# Patient Record
Sex: Male | Born: 1970 | Race: White | Hispanic: No | Marital: Married | Smoking: Never smoker
Health system: Southern US, Community
[De-identification: ages and names within clinical notes are randomized; demographics above are authoritative.]

## PROBLEM LIST (undated history)

## (undated) DIAGNOSIS — E785 Hyperlipidemia, unspecified: Secondary | ICD-10-CM

## (undated) DIAGNOSIS — I10 Essential (primary) hypertension: Secondary | ICD-10-CM

## (undated) HISTORY — DX: Essential (primary) hypertension: I10

## (undated) HISTORY — DX: Hyperlipidemia, unspecified: E78.5

## (undated) HISTORY — PX: VASECTOMY: SHX75

---

## 1990-11-22 HISTORY — PX: VASECTOMY: SHX75

## 2009-01-21 ENCOUNTER — Encounter: Payer: Self-pay | Admitting: Cardiology

## 2009-01-21 ENCOUNTER — Ambulatory Visit: Payer: Self-pay | Admitting: Cardiology

## 2009-01-21 DIAGNOSIS — I10 Essential (primary) hypertension: Secondary | ICD-10-CM | POA: Insufficient documentation

## 2009-01-21 DIAGNOSIS — F528 Other sexual dysfunction not due to a substance or known physiological condition: Secondary | ICD-10-CM

## 2009-01-21 DIAGNOSIS — N529 Male erectile dysfunction, unspecified: Secondary | ICD-10-CM | POA: Insufficient documentation

## 2009-01-21 DIAGNOSIS — E785 Hyperlipidemia, unspecified: Secondary | ICD-10-CM

## 2009-04-30 ENCOUNTER — Encounter (INDEPENDENT_AMBULATORY_CARE_PROVIDER_SITE_OTHER): Payer: Self-pay | Admitting: *Deleted

## 2009-06-10 ENCOUNTER — Ambulatory Visit: Payer: Self-pay | Admitting: Cardiology

## 2009-09-18 ENCOUNTER — Encounter (INDEPENDENT_AMBULATORY_CARE_PROVIDER_SITE_OTHER): Payer: Self-pay | Admitting: *Deleted

## 2010-05-18 ENCOUNTER — Telehealth: Payer: Self-pay | Admitting: Cardiology

## 2010-07-15 ENCOUNTER — Ambulatory Visit: Payer: Self-pay | Admitting: Cardiology

## 2010-07-15 DIAGNOSIS — F411 Generalized anxiety disorder: Secondary | ICD-10-CM

## 2010-07-15 DIAGNOSIS — F419 Anxiety disorder, unspecified: Secondary | ICD-10-CM

## 2010-07-15 HISTORY — DX: Anxiety disorder, unspecified: F41.9

## 2010-12-22 NOTE — Progress Notes (Signed)
Summary: refill  Medications Added * MULTI VITAMIN ONE by mouth DAILY * FISH OIL ONE by mouth DAILY * VITAMIN B-6 ONE by mouth DAILY VITAMIN B-12 100 MCG TABS (CYANOCOBALAMIN) once daily LISINOPRIL 5 MG TABS (LISINOPRIL) once daily LISINOPRIL 10 MG TABS (LISINOPRIL) 1 by mouth daily       Phone Note Refill Request Message from:  Patient on May 18, 2010 9:09 AM  Refills Requested: Medication #1:  LISINOPRIL 5 MG TABS once daily. Send to [facility]  [phone]  Initial call taken by: Judie Grieve,  May 18, 2010 9:10 AM  Follow-up for Phone Call        Home number not working.Called [facility] to change medication to 10mg  1 by mouth daily. Marrion Coy, CNA  May 18, 2010 11:15 AM  Follow-up by: Marrion Coy, CNA,  May 18, 2010 11:15 AM    New/Updated Medications: LISINOPRIL 10 MG TABS (LISINOPRIL) 1 by mouth daily Prescriptions: LISINOPRIL 10 MG TABS (LISINOPRIL) 1 by mouth daily  #30 x 2   Entered by:   Marrion Coy, CNA   Authorized by:   Rollene Rotunda, MD, [facility]   Signed by:   Marrion Coy, CNA on 05/18/2010   Method used:   Electronically to        [facility]  [address] (retail)       [address]       [place], [place]  [address]       Ph: [phone]       Fax: [phone]   RxID:   [id number] LISINOPRIL 5 MG TABS (LISINOPRIL) once daily  #30 x 1   Entered by:   Marrion Coy, CNA   Authorized by:   Rollene Rotunda, MD, [facility]   Signed by:   Marrion Coy, CNA on 05/18/2010   Method used:   Electronically to        [facility]  [address] (retail)       [address]       [place], [place]  [address]       Ph: [phone]       Fax: [phone]   RxID:   [id number]

## 2010-12-22 NOTE — Assessment & Plan Note (Signed)
Summary: ROV  Medications Added LISINOPRIL 10 MG TABS (LISINOPRIL) 1/2 by mouth daily      Allergies Added: NKDA\   Visit Type:  Follow-up Primary Provider:  Dr. Elana Alm  CC:  HTN.  History of Present Illness: The patient presents for followup of he is hypertension. At the last visit I had asked him to increase his lisinopril. However, he misunderstood and has only been taking continued one half tablet. He says however that with this his blood pressure is controlled in the 130s over 70s at home. He does not have any new cardiovascular complaints although he has had a lot of stress recently. He says that he has been quick to be agitated and has a very short temper. He's actually had to take Xanax from his wife recently. He's not sure what his blood pressure does during these episodes. He also describes some increased sweating and feeling very hot natured. He is not having any palpitations, presyncope or syncope. He's not having any new shortness of breath, PND orthopnea. He denies any chest pain.  Current Medications (verified): 1)  Multi Vitamin .... One By Mouth Daily 2)  Fish Oil .... One By Mouth Daily 3)  Vitamin B-6 .... One By Mouth Daily 4)  Vitamin B-12 100 Mcg Tabs (Cyanocobalamin) .... Once Daily 5)  Lisinopril 10 Mg Tabs (Lisinopril) .... 1/2 By Mouth Daily  Allergies (verified): No Known Drug Allergies  Past History:  Past Medical History: Reviewed history from 06/10/2009 and no changes required. Dyslipidemia Hypertension  Past Surgical History: Reviewed history from 06/09/2009 and no changes required. Vasectomy  Review of Systems       As stated in the HPI and negative for all other systems.   Vital Signs:  Patient profile:   40 year old male Height:      69 inches Weight:      202 pounds BMI:     29.94 Pulse rate:   79 / minute Resp:     16 per minute BP sitting:   142 /   Vitals Entered By: Marrion Coy, CNA (July 15, 2010 4:14 PM)  Physical  Exam  General:  Well developed, well nourished, in no acute distress. Head:  normocephalic and atraumatic Neck:  Neck supple, no JVD. No masses, thyromegaly or abnormal cervical nodes. Chest Wall:  no deformities or breast masses noted Lungs:  Clear bilaterally to auscultation and percussion. Abdomen:  Bowel sounds positive; abdomen soft and non-tender without masses, organomegaly, or hernias noted. No hepatosplenomegaly. Msk:  Back normal, normal gait. Muscle strength and tone normal. Extremities:  No clubbing or cyanosis. Neurologic:  Alert and oriented x 3. Skin:  Intact without lesions or rashes. Cervical Nodes:  no significant adenopathy Psych:  Normal affect.   Detailed Cardiovascular Exam  Neck    Carotids: Carotids full and equal bilaterally without bruits.      Neck Veins: Normal, no JVD.    Heart    Inspection: no deformities or lifts noted.      Palpation: normal PMI with no thrills palpable.      Auscultation: regular rate and rhythm, S1, S2 without murmurs, rubs, gallops, or clicks.    Vascular    Abdominal Aorta: no palpable masses, pulsations, or audible bruits.      Femoral Pulses: normal femoral pulses bilaterally.      Pedal Pulses: normal pedal pulses bilaterally.      Radial Pulses: normal radial pulses bilaterally.      Peripheral Circulation: no clubbing,  cyanosis, or edema noted with normal capillary refill.     EKG  Procedure date:  07/15/2010  Findings:      Sinus rhythm, rate 79, axis within normal limits, intervals within normal limits, no acute ST-T wave changes  Impression & Recommendations:  Problem # 1:  ESSENTIAL HYPERTENSION, BENIGN (ICD-401.1) Since he seems to be doing well I will keep him on the current regimen. However, he never had the VMA and metanephrine analysis that he was to have and I will reorder this. Orders: EKG w/ Interpretation (93000)  Problem # 2:  TOBACCO ABUSE (ICD-305.1) He is completely abstaining from tobacco  products and I applaud this.  Problem # 3:  ANXIETY STATE, UNSPECIFIED (ICD-300.00) He has a lot of anxiety type II that of a coworker. Sounds like he has some anger management issues. I have asked him to discuss this with his primary provider as he may need prescriptions for an aunt to anxiety or antidepressant.  Patient Instructions: 1)  Your physician recommends that you schedule a follow-up appointment in: 1 year with Dr Antoine Poche 2)  Your physician recommends that you return for lab work as scheduled  24 hour urine for VMA and mets 3)  Your physician recommends that you continue on your current medications as directed. Please refer to the Current Medication list given to you today.

## 2011-06-09 ENCOUNTER — Telehealth: Payer: Self-pay | Admitting: Cardiology

## 2011-06-09 DIAGNOSIS — I10 Essential (primary) hypertension: Secondary | ICD-10-CM

## 2011-06-09 NOTE — Telephone Encounter (Signed)
Pt has been treated here most recently in 8/11 for HTN, Tobacco abuse and anxiety.  He was in normal sinus rhythm with a HR of 79.  He denied any SOB at that time.  Pt was to have had a 24 hour urine for VMA and catecholimines however I do not see where this was done.  Pt should see primary care MD 1 st.

## 2011-06-09 NOTE — Telephone Encounter (Signed)
Pt has been having sob, made appt with pcp and wife thinks he should be seen here, appt with hochrein 9-6

## 2011-06-10 ENCOUNTER — Other Ambulatory Visit: Payer: Self-pay | Admitting: *Deleted

## 2011-06-10 DIAGNOSIS — I10 Essential (primary) hypertension: Secondary | ICD-10-CM

## 2011-06-10 MED ORDER — LISINOPRIL 10 MG PO TABS: 5.0000 mg | ORAL_TABLET | Freq: Every day | ORAL | Status: DC

## 2011-06-10 NOTE — Telephone Encounter (Signed)
Per pt call, pt returning call. Please call back.

## 2011-06-10 NOTE — Telephone Encounter (Signed)
In speaking with pt he states that he has been waking in the night with a rapid heart rate.  He is not able to take his pulse therefore he doesn't know how fast it is going.  The rapid hr causes him to sweat but he has no chest pain or shortness of breath.  He reports that he has not been taking his medications and would like a rx sent into his pharmacy so that he can restart it.  His BP has been elevated at 135/90 and higher.  Pt was offered an appointment with Lilian Coma however he would like to wait to see Dr Antoine Poche.

## 2011-07-02 ENCOUNTER — Encounter: Payer: Self-pay | Admitting: Cardiology

## 2011-07-29 ENCOUNTER — Ambulatory Visit (INDEPENDENT_AMBULATORY_CARE_PROVIDER_SITE_OTHER): Payer: 59 | Admitting: Cardiology

## 2011-07-29 ENCOUNTER — Encounter: Payer: Self-pay | Admitting: Cardiology

## 2011-07-29 DIAGNOSIS — I10 Essential (primary) hypertension: Secondary | ICD-10-CM

## 2011-07-29 DIAGNOSIS — F411 Generalized anxiety disorder: Secondary | ICD-10-CM

## 2011-07-29 DIAGNOSIS — F528 Other sexual dysfunction not due to a substance or known physiological condition: Secondary | ICD-10-CM

## 2011-07-29 DIAGNOSIS — F17200 Nicotine dependence, unspecified, uncomplicated: Secondary | ICD-10-CM

## 2011-07-29 NOTE — Patient Instructions (Signed)
Follow up in 1 year with Dr Hochrein.  You will receive a letter in the mail 2 months before you are due.  Please call us when you receive this letter to schedule your follow up appointment.  The current medical regimen is effective;  continue present plan and medications.  

## 2011-07-29 NOTE — Progress Notes (Signed)
HPI The patient presents for follow up of HTN.  Since I last saw him he, on his own, increased lisinopril to 10 mg daily.  He was taking 5 mg.  His BP has been 130 - 140/80 - 99.  The patient denies any new symptoms such as chest discomfort, neck or arm discomfort. There has been no new shortness of breath, PND or orthopnea. There have been no reported palpitations, presyncope or syncope.  He does have occasional ED.  His heart races when he is watching the races and gets excited.  No Known Allergies  Current Outpatient Prescriptions  Medication Sig Dispense Refill  . Fish Oil OIL Take 1 tablet by mouth daily.        Marland Kitchen lisinopril (PRINIVIL,ZESTRIL) 10 MG tablet Take 10 mg by mouth daily.        . Potassium Chloride (KLOR-CON PO) Take by mouth as directed. 2 po dialy       . Pyridoxine HCl (VITAMIN B-6 PO) Take 1 tablet by mouth daily.        . vitamin B-12 (CYANOCOBALAMIN) 100 MCG tablet Take 100 mcg by mouth daily.          Past Medical History  Diagnosis Date  . Dyslipidemia   . HTN (hypertension)     Past Surgical History  Procedure Date  . Vasectomy     ROS: As stated in the HPI and negative for all other systems.  PHYSICAL EXAM BP 140/99  Pulse 79  Resp 16  Ht 5\' 9"  (1.753 m)  Wt 201 lb (91.173 kg)  BMI 29.68 kg/m2 GENERAL:  Well appearing HEENT:  Pupils equal round and reactive, fundi not visualized, oral mucosa unremarkable NECK:  No jugular venous distention, waveform within normal limits, carotid upstroke brisk and symmetric, no bruits, no thyromegaly LYMPHATICS:  No cervical, inguinal adenopathy LUNGS:  Clear to auscultation bilaterally BACK:  No CVA tenderness CHEST:  Unremarkable HEART:  PMI not displaced or sustained,S1 and S2 within normal limits, no S3, no S4, no clicks, no rubs, no murmurs ABD:  Flat, positive bowel sounds normal in frequency in pitch, no bruits, no rebound, no guarding, no midline pulsatile mass, no hepatomegaly, no splenomegaly EXT:  2  plus pulses throughout, no edema, no cyanosis no clubbing SKIN:  No rashes no nodules NEURO:  Cranial nerves II through XII grossly intact, motor grossly intact throughout PSYCH:  Cognitively intact, oriented to person place and time  EKG:  Sinus rhythm, rate 70, axis within normal limits, intervals within normal limits, no acute ST-T wave changes.   ASSESSMENT AND PLAN

## 2011-07-29 NOTE — Assessment & Plan Note (Signed)
He will keep taking the 10 mg lisinopril every day.  He will lose 5 lbs and continue to reduce salt.  If he is running greater than 140/90 he will let me know.

## 2011-07-29 NOTE — Assessment & Plan Note (Signed)
He is avoiding all tobacco.

## 2011-07-29 NOTE — Assessment & Plan Note (Signed)
I do think that this plays a role and we have talked about this.

## 2011-07-29 NOTE — Assessment & Plan Note (Signed)
He will take Viagra PRN.

## 2013-10-22 ENCOUNTER — Ambulatory Visit (INDEPENDENT_AMBULATORY_CARE_PROVIDER_SITE_OTHER): Payer: BC Managed Care – PPO | Admitting: *Deleted

## 2013-10-22 DIAGNOSIS — Z23 Encounter for immunization: Secondary | ICD-10-CM

## 2013-10-25 ENCOUNTER — Encounter: Payer: Self-pay | Admitting: Emergency Medicine

## 2013-10-25 ENCOUNTER — Ambulatory Visit (INDEPENDENT_AMBULATORY_CARE_PROVIDER_SITE_OTHER): Payer: BC Managed Care – PPO | Admitting: Emergency Medicine

## 2013-10-25 VITALS — BP 132/96 | HR 84 | Temp 98.2°F | Resp 18 | Ht 69.0 in | Wt 205.0 lb

## 2013-10-25 DIAGNOSIS — L0291 Cutaneous abscess, unspecified: Secondary | ICD-10-CM

## 2013-10-25 DIAGNOSIS — S4992XA Unspecified injury of left shoulder and upper arm, initial encounter: Secondary | ICD-10-CM

## 2013-10-25 DIAGNOSIS — M7989 Other specified soft tissue disorders: Secondary | ICD-10-CM

## 2013-10-25 DIAGNOSIS — L0390 Cellulitis, unspecified: Secondary | ICD-10-CM

## 2013-10-25 MED ORDER — CEFTRIAXONE SODIUM 500 MG IJ SOLR
500.0000 mg | Freq: Once | INTRAMUSCULAR | Status: AC
  Administered 2013-10-25: 500 mg via INTRAMUSCULAR

## 2013-10-25 MED ORDER — AZITHROMYCIN 250 MG PO TABS: ORAL_TABLET | ORAL | Status: AC

## 2013-10-25 NOTE — Patient Instructions (Signed)
Cellulitis °Cellulitis is an infection of the skin and the tissue under the skin. The infected area is usually red and tender. This happens most often in the arms and lower legs. °HOME CARE  °· Take your antibiotic medicine as told. Finish the medicine even if you start to feel better. °· Keep the infected arm or leg raised (elevated). °· Put a warm cloth on the area up to 4 times per day. °· Only take medicines as told by your doctor. °· Keep all doctor visits as told. °GET HELP RIGHT AWAY IF:  °· You have a fever. °· You feel very sleepy. °· You throw up (vomit) or have watery poop (diarrhea). °· You feel sick and have muscle aches and pains. °· You see red streaks on the skin coming from the infected area. °· Your red area gets bigger or turns a dark color. °· Your bone or joint under the infected area is painful after the skin heals. °· Your infection comes back in the same area or different area. °· You have a puffy (swollen) bump in the infected area. °· You have new symptoms. °MAKE SURE YOU:  °· Understand these instructions. °· Will watch your condition. °· Will get help right away if you are not doing well or get worse. °Document Released: 04/26/2008 Document Revised: 05/09/2012 Document Reviewed: 01/24/2012 °[employer]® Patient Information ©2014 [employer]. ° °

## 2013-10-27 NOTE — Progress Notes (Signed)
   Subjective:    Patient ID: Walter Wilkinson, male    DOB: [birth year], 42 y.o.   MRN: [id number]  HPI Comments: 42 yo for evaluation of left forearm injury. He was punctured by piece of metal yesterday while working. He notes wound was cleaned. He has noticed increased swelling and redness since this a.m. He updated TDAP yesterday.    Current Outpatient Prescriptions on File Prior to Visit  Medication Sig Dispense Refill  . Fish Oil OIL Take 1 tablet by mouth daily.        . Potassium Chloride (KLOR-CON PO) Take by mouth as directed. 2 po dialy       . Pyridoxine HCl (VITAMIN B-6 PO) Take 1 tablet by mouth daily.        . vitamin B-12 (CYANOCOBALAMIN) 100 MCG tablet Take 100 mcg by mouth daily.        Marland Kitchen lisinopril (PRINIVIL,ZESTRIL) 10 MG tablet Take 10 mg by mouth daily.        No current facility-administered medications on file prior to visit.    Review of patient's allergies indicates no known allergies.  Past Medical History  Diagnosis Date  . Dyslipidemia   . HTN (hypertension)     Review of Systems  Constitutional: Negative for fever.  Musculoskeletal: Positive for joint swelling and myalgias. Negative for arthralgias.  Skin: Positive for wound.  All other systems reviewed and are negative.    BP 132/96  Pulse 84  Temp(Src) 98.2 F (36.8 C) (Temporal)  Resp 18  Ht 5\' 9"  (1.753 m)  Wt 205 lb (92.987 kg)  BMI 30.26 kg/m2     Objective:   Physical Exam  Nursing note and vitals reviewed. Constitutional: He is oriented to person, place, and time. He appears well-developed and well-nourished.  Musculoskeletal: Normal range of motion. He exhibits edema and tenderness.  Left proximal, anterior, lateral aspect .5 cm wound with mild erythema and edema aprox quarter size. Full ROM of left elbow  Lymphadenopathy:    He has no cervical adenopathy.  Neurological: He is alert and oriented to person, place, and time. He has normal reflexes. No cranial nerve deficit. He  exhibits normal muscle tone. Coordination normal.  Skin: Skin is warm and dry. There is erythema.  Psychiatric: Judgment normal.          Assessment & Plan:  1.Left forearm injury, ? FB vs wound infection. Rocephin 1 gm IM. Xray if no change. Advised of wound Hygiene. Zpak AD if no change in 24 hours. If symptoms increase ER 2. Elevated BP check at home if > 130/80 call office

## 2013-11-21 ENCOUNTER — Encounter: Payer: Self-pay | Admitting: Internal Medicine

## 2013-11-27 ENCOUNTER — Ambulatory Visit (INDEPENDENT_AMBULATORY_CARE_PROVIDER_SITE_OTHER): Payer: BC Managed Care – PPO | Admitting: Physician Assistant

## 2013-11-27 ENCOUNTER — Encounter: Payer: Self-pay | Admitting: Physician Assistant

## 2013-11-27 VITALS — BP 112/78 | HR 80 | Temp 98.7°F | Resp 16 | Ht 69.0 in | Wt 209.0 lb

## 2013-11-27 DIAGNOSIS — E782 Mixed hyperlipidemia: Secondary | ICD-10-CM

## 2013-11-27 DIAGNOSIS — E559 Vitamin D deficiency, unspecified: Secondary | ICD-10-CM

## 2013-11-27 DIAGNOSIS — E785 Hyperlipidemia, unspecified: Secondary | ICD-10-CM

## 2013-11-27 DIAGNOSIS — Z79899 Other long term (current) drug therapy: Secondary | ICD-10-CM

## 2013-11-27 DIAGNOSIS — I10 Essential (primary) hypertension: Secondary | ICD-10-CM

## 2013-11-27 LAB — CBC WITH DIFFERENTIAL/PLATELET
Basophils Absolute: 0.1 10*3/uL (ref 0.0–0.1)
Basophils Relative: 2 % — ABNORMAL HIGH (ref 0–1)
EOS ABS: 0.2 10*3/uL (ref 0.0–0.7)
EOS PCT: 2 % (ref 0–5)
HEMATOCRIT: 44.6 % (ref 39.0–52.0)
Hemoglobin: 16 g/dL (ref 13.0–17.0)
LYMPHS PCT: 28 % (ref 12–46)
Lymphs Abs: 2 10*3/uL (ref 0.7–4.0)
MCH: 32.6 pg (ref 26.0–34.0)
MCHC: 35.9 g/dL (ref 30.0–36.0)
MCV: 90.8 fL (ref 78.0–100.0)
MONOS PCT: 9 % (ref 3–12)
Monocytes Absolute: 0.6 10*3/uL (ref 0.1–1.0)
Neutro Abs: 4.1 10*3/uL (ref 1.7–7.7)
Neutrophils Relative %: 59 % (ref 43–77)
Platelets: 255 10*3/uL (ref 150–400)
RBC: 4.91 MIL/uL (ref 4.22–5.81)
RDW: 13.2 % (ref 11.5–15.5)
WBC: 7 10*3/uL (ref 4.0–10.5)

## 2013-11-27 NOTE — Progress Notes (Signed)
HPI Patient presents for 3 month follow up with hypertension, hyperlipidemia,and vitamin D. Patient's blood pressure has been controlled at home, today their BP is BP: 112/78 mmHg  Patient denies chest pain, shortness of breath, dizziness.  Patient's cholesterol is diet controlled.The cholesterol last visit was trigs 318, HDL 31, and LDL 54. Patient has quit drinking alcohol for the time being.  Patient is on Vitamin D supplement.   Current Medications:  Current Outpatient Prescriptions on File Prior to Visit  Medication Sig Dispense Refill  . Cholecalciferol (VITAMIN D-3) 5000 UNITS TABS Take by mouth 2 (two) times daily.      . Fish Oil OIL Take 1 tablet by mouth daily.        Marland Kitchen. lisinopril (PRINIVIL,ZESTRIL) 20 MG tablet Take 20 mg by mouth daily.      . Potassium Chloride (KLOR-CON PO) Take by mouth as directed. 2 po dialy       . Pyridoxine HCl (VITAMIN B-6 PO) Take 1 tablet by mouth daily.        . vitamin B-12 (CYANOCOBALAMIN) 100 MCG tablet Take 100 mcg by mouth daily.         No current facility-administered medications on file prior to visit.   Medical History:  Past Medical History  Diagnosis Date  . Dyslipidemia   . HTN (hypertension)    Allergies: No Known Allergies  ROS Constitutional: Denies fever, chills, headaches, insomnia, fatigue, night sweats Eyes: Denies redness, blurred vision, diplopia, discharge, itchy, watery eyes.  ENT: Denies congestion, post nasal drip, sore throat, earache, dental pain, Tinnitus, Vertigo, Sinus pain, snoring.  Cardio: Denies chest pain, palpitations, irregular heartbeat, dyspnea, diaphoresis, orthopnea, PND, claudication, edema Respiratory: denies cough, shortness of breath, wheezing.  Gastrointestinal: Denies dysphagia, heartburn, AB pain/ cramps, N/V, diarrhea, constipation, hematemesis, melena, hematochezia,  hemorrhoids Genitourinary: Denies dysuria, frequency, urgency, nocturia, hesitancy, discharge, hematuria, flank  pain Musculoskeletal: Denies myalgia, stiffness, pain, swelling and strain/sprain. Skin: Denies pruritis, rash, changing in skin lesion Neuro: Denies Weakness, tremor, incoordination, spasms, pain Psychiatric: Denies confusion, memory loss, sensory loss Endocrine: Denies change in weight, skin, hair change, nocturia Diabetic Polys, Denies visual blurring, hyper /hypo glycemic episodes, and paresthesia, Heme/Lymph: Denies Excessive bleeding, bruising, enlarged lymph nodes  Family history- Review and unchanged Social history- Review and unchanged Physical Exam: Filed Vitals:   11/27/13 1633  BP: 112/78  Pulse: 80  Temp: 98.7 F (37.1 C)  Resp: 16   Filed Weights   11/27/13 1633  Weight: 209 lb (94.802 kg)   General Appearance: Well nourished, in no apparent distress. Eyes: PERRLA, EOMs, conjunctiva no swelling or erythema Sinuses: No Frontal/maxillary tenderness ENT/Mouth: Ext aud canals clear, TMs without erythema, bulging. No erythema, swelling, or exudate on post pharynx.  Tonsils not swollen or erythematous. Hearing normal.  Neck: Supple, thyroid normal.  Respiratory: Respiratory effort normal, BS equal bilaterally without rales, rhonchi, wheezing or stridor.  Cardio: RRR with no MRGs. Brisk peripheral pulses without edema.  Abdomen: Soft, + BS.  Non tender, no guarding, rebound, hernias, masses. Lymphatics: Non tender without lymphadenopathy.  Musculoskeletal: Full ROM, 5/5 strength, normal gait.  Skin: Warm, dry without rashes, lesions, ecchymosis.  Neuro: Cranial nerves intact. Normal muscle tone, no cerebellar symptoms. Sensation intact.  Psych: Awake and oriented X 3, normal affect, Insight and Judgment appropriate.   Assessment and Plan:  Hypertension: Continue medication, monitor blood pressure at home.  Continue DASH diet. Cholesterol: Continue diet and exercise. Check cholesterol.  Vitamin D Def- check level and continue medications.  Continue diet and meds as  discussed. Further disposition pending results of labs.  Quentin Mulling 4:57 PM

## 2013-11-28 LAB — LIPID PANEL
Cholesterol: 171 mg/dL (ref 0–200)
HDL: 30 mg/dL — ABNORMAL LOW (ref >39)
Total CHOL/HDL Ratio: 5.7 Ratio
Triglycerides: 571 mg/dL — ABNORMAL HIGH (ref <150)

## 2013-11-28 LAB — HEPATIC FUNCTION PANEL
ALBUMIN: 4.7 g/dL (ref 3.5–5.2)
ALT: 30 U/L (ref 0–53)
AST: 40 U/L — AB (ref 0–37)
Alkaline Phosphatase: 76 U/L (ref 39–117)
Bilirubin, Direct: 0.1 mg/dL (ref 0.0–0.3)
Indirect Bilirubin: 0.7 mg/dL (ref 0.0–0.9)
Total Bilirubin: 0.8 mg/dL (ref 0.3–1.2)
Total Protein: 7.5 g/dL (ref 6.0–8.3)

## 2013-11-28 LAB — BASIC METABOLIC PANEL WITH GFR
BUN: 12 mg/dL (ref 6–23)
CHLORIDE: 100 meq/L (ref 96–112)
CO2: 31 meq/L (ref 19–32)
CREATININE: 0.81 mg/dL (ref 0.50–1.35)
Calcium: 9.9 mg/dL (ref 8.4–10.5)
GFR, Est African American: >89 mL/min
GFR, Est Non African American: >89 mL/min
GLUCOSE: 103 mg/dL — AB (ref 70–99)
Potassium: 4.7 mEq/L (ref 3.5–5.3)
Sodium: 138 mEq/L (ref 135–145)

## 2013-11-28 LAB — TSH: TSH: 1.774 u[IU]/mL (ref 0.350–4.500)

## 2013-11-28 LAB — MAGNESIUM: Magnesium: 2.1 mg/dL (ref 1.5–2.5)

## 2013-11-28 LAB — VITAMIN D 25 HYDROXY (VIT D DEFICIENCY, FRACTURES): Vit D, 25-Hydroxy: 98 ng/mL — ABNORMAL HIGH (ref 30–89)

## 2013-11-28 MED ORDER — FENOFIBRATE MICRONIZED 134 MG PO CAPS: 134.0000 mg | ORAL_CAPSULE | Freq: Every day | ORAL | Status: DC

## 2013-11-28 NOTE — Addendum Note (Signed)
Addended by: Quentin MullingOLLIER, Diala Waxman R on: 11/28/2013 08:21 AM   Modules accepted: Orders

## 2013-12-31 ENCOUNTER — Ambulatory Visit (INDEPENDENT_AMBULATORY_CARE_PROVIDER_SITE_OTHER): Payer: BC Managed Care – PPO | Admitting: Physician Assistant

## 2013-12-31 ENCOUNTER — Encounter: Payer: Self-pay | Admitting: Physician Assistant

## 2013-12-31 VITALS — BP 132/80 | HR 76 | Temp 98.9°F | Resp 16 | Ht 69.0 in | Wt 205.0 lb

## 2013-12-31 DIAGNOSIS — M7580 Other shoulder lesions, unspecified shoulder: Secondary | ICD-10-CM

## 2013-12-31 DIAGNOSIS — I10 Essential (primary) hypertension: Secondary | ICD-10-CM

## 2013-12-31 DIAGNOSIS — M25819 Other specified joint disorders, unspecified shoulder: Secondary | ICD-10-CM

## 2013-12-31 DIAGNOSIS — R6889 Other general symptoms and signs: Secondary | ICD-10-CM

## 2013-12-31 DIAGNOSIS — E785 Hyperlipidemia, unspecified: Secondary | ICD-10-CM

## 2013-12-31 DIAGNOSIS — M7540 Impingement syndrome of unspecified shoulder: Secondary | ICD-10-CM

## 2013-12-31 MED ORDER — CITALOPRAM HYDROBROMIDE 40 MG PO TABS: 40.0000 mg | ORAL_TABLET | Freq: Every day | ORAL | Status: DC

## 2013-12-31 NOTE — Patient Instructions (Signed)
Get a cervical neck pillow Get back on citalopram 40 and if you do not get better or get worse symptoms we can get labs  Secondary Impingement Syndrome with Rehab  The rotator cuff consists of four muscles that are responsible for much of the function of the shoulder joint. The subacromial bursa is a fluid filled sac that reduces friction between part of the shoulder (acromion) and the rotator cuff tendons. Secondary impingement syndrome is a condition in which the subacromial bursa or tendons of the rotator cuff become inflamed. Inflammation of either of these tendons causes pain and weakness in the shoulder.  SYMPTOMS   Pain, weakness, and/or loss of motion in the shoulder.  Pain that worsens with shoulder function.  Tenderness, swelling, warmth, or redness may occasionally be present over the outer aspect of the shoulder.  A crackling sound (crepitation) when the shoulder is moved. CAUSES  Secondary impingement syndrome is caused by inflammation of the rotator cuff tendons or subacromial bursa. Common mechanisms of injury include:  Repetitive and/or strenuous shoulder movements, especially those above shoulder height.  Direct trauma to the shoulder (uncommon). RISK INCREASES WITH:  Contact sports (football, wrestling, or boxing).  Activities involving arm movement above shoulder height (baseball, volleyball, or racquet sports).  Water engineer.  Heavy labor.  Previous shoulder injury.  Poor strength and flexibility.  Loose shoulder ligaments. PREVENTION  Warm up and stretch properly before activity.  Allow for adequate recovery between workouts.  Maintain physical fitness:  Strength, flexibility, and endurance.  Cardiovascular fitness.  Learn and use proper technique. When possible, have coach correct improper technique. PROGNOSIS  If treated properly, then secondary shoulder impingement normally resolves with conservative treatment. RELATED  COMPLICATIONS   Prolonged healing time, if improperly treated or re-injured.  Recurrent symptoms that result in a chronic problem.  Other shoulder conditions, such as frozen shoulder or a rotator cuff tear. TREATMENT Treatment initially involves the use of ice and medication to help reduce pain and inflammation. The use of strengthening and stretching exercises may help reduce pain with activity, especially those exercises focused on stabilizing the shoulder blade (scapula). These exercises may be performed at home or with referral to a therapist. If symptoms persist for greater than 6 months despite non-surgical (conservative) treatment, then surgery may be recommended.  MEDICATION   If pain medication is necessary, then nonsteroidal anti-inflammatory medications, such as aspirin and ibuprofen, or other minor pain relievers, such as acetaminophen, are often recommended.  Do not take pain medication for 7 days before surgery.  Prescription pain relievers may be given if deemed necessary by your caregiver. Use only as directed and only as much as you need. COLD THERAPY  Cold treatment (icing) relieves pain and reduces inflammation. Cold treatment should be applied for 10 to 15 minutes every 2 to 3 hours for inflammation and pain and immediately after any activity that aggravates your symptoms. Use ice packs or massage the area with a piece of ice (ice massage). SEEK MEDICAL CARE IF:  Treatment seems to offer no benefit, or the condition worsens.  Any medications produce adverse side effects. EXERCISES RANGE OF MOTION (ROM) AND STRETCHING EXERCISES - SECONDARY IMPINGEMENT SYNDROME These exercises may help you when beginning to rehabilitate your injury. Your symptoms may resolve with or without further involvement from your physician, physical therapist or athletic trainer. While completing these exercises, remember:   Restoring tissue flexibility helps normal motion to return to the joints.  This allows healthier, less painful movement and activity.  An effective stretch should be held for at least 30 seconds.  A stretch should never be painful. You should only feel a gentle lengthening or release in the stretched tissue. ROM - Internal Rotation, Supine  Lie on your back on a firm surface. Place your right / left elbow about 60 degrees away from your side. Elevate your elbow with a folded towel so that the elbow and shoulder are the same height.  Using a broomstick or cane and your strong arm, pull your right / left hand toward your body until you feel a gentle stretch, but no increase in your shoulder pain. Keep your shoulder and elbow in place throughout the exercise.  Hold __________. Slowly return to the starting position. Repeat __________ times. Complete this exercise __________ times per day. STRETCH - Internal Rotation  Place your right / left hand behind your back, palm-up.  Throw a towel or belt over your opposite shoulder. Grasp the towel/belt with your right / left hand.  While keeping an upright posture, gently pull up on the towel/belt until you feel a stretch in the front of your right / left shoulder.  Avoid shrugging your right / left shoulder as your arm rises by keeping your shoulder blade tucked down and toward your mid-back spine.  Hold __________. Release the stretch by lowering your opposite hand. Repeat __________ times. Complete this exercise __________ times per day.  ROM - Internal Rotation  Using underhand grips, grasp a stick behind your back with both hands.  While standing upright with good posture, slide the stick up your back until you feel a mild stretch in the front of your shoulder.  Hold __________ seconds. Slowly return to your starting position.  Grasp a stick behind your back with both hands. Repeat __________ times. Complete this exercise __________ times per day.  STRETCH - External Rotation and Abduction  Stagger your stance  through a doorframe. It does not matter which foot is forward.  As instructed by your physician, physical therapist or athletic trainer, place your hands:  And forearms above your head and on the door frame.  And forearms at head-height and on the door frame.  At elbow-height and on the door frame.  Keeping your head and chest upright and your stomach muscles tight to prevent over-extending your low-back, slowly shift your weight onto your front foot until you feel a stretch across your chest and/or in the front of your shoulders.  Hold __________ seconds. Shift your weight to your back foot to release the stretch. Repeat __________ times. Complete this stretch __________ times per day.  STRETCH - Posterior Shoulder Capsule  Stand or sit with good posture. Grasp your right / left elbow and draw it across your chest. Keep it the same height as your shoulder.  Pull your elbow so your upper arm comes in closer to your chest. Pull until you feel a gentle stretch in the back of your shoulder.  Hold __________. Repeat __________ times. Complete this exercise __________ times per day. STRENGTHENING EXERCISES - Secondary Impingement Syndrome  These exercises may help you when beginning to rehabilitate your injury. They may resolve your symptoms with or without further involvement from your physician, physical therapist or athletic trainer. While completing these exercises, remember:   Muscles can gain both the endurance and the strength needed for everyday activities through controlled exercises.  Complete these exercises as instructed by your physician, physical therapist or athletic trainer. Progress with the resistance and repetition exercises only as  your caregiver advises.  You may experience muscle soreness or fatigue, but the pain or discomfort you are trying to eliminate should never worsen during these exercises. If this pain does worsen, stop and make certain you are following the  directions exactly. If the pain is still present after adjustments, discontinue the exercise until you can discuss the trouble with your clinician.  During your recovery, avoid activity or exercises which involve actions that place your right / left hand or elbow above your head or behind your back or head. These positions stress the tissues which are trying to heal. STRENGTH - Scapular Depression and Adduction  With good posture, sit on a firm chair. Support your arms in front of you with pillows, arm rests or a table top. Have your elbows in line with the sides of your body.  Gently draw your shoulder blades down and toward your mid-back spine. Gradually increase the tension without tensing the muscles along the top of your shoulders and the back of your neck.  Hold for __________ seconds. Slowly release the tension and relax your muscles completely before completing the next repetition.  After you have practiced this exercise, remove the arm support and complete it in standing as well as sitting. Repeat __________ times. Complete this exercise __________ times per day.  STRENGTH - Shoulder Abductors, Isometric  With good posture, stand or sit about 4-6 inches from a wall with your right / left side facing the wall.  Bend your right / left elbow. Gently press your right / left elbow into the wall. Increase the pressure gradually until you are pressing as hard as you can without shrugging your shoulder or increasing any shoulder discomfort.  Hold __________ seconds.  Release the tension slowly. Relax your shoulder muscles completely before you do the next repetition. Repeat __________ times. Complete this exercise __________ times per day.  STRENGTH - External Rotators, Isometric  Keep your right / left elbow at your side and bend it 90 degrees.  Step into a door frame so that the outside of your right / left wrist can press against the door frame without your upper arm leaving your  side.  Gently press your right / left wrist into the door frame as if you were trying to swing the back of your hand away from your abdomen. Gradually increase the tension until you are pressing as hard as you can without shrugging your shoulder or increasing any shoulder discomfort.  Hold __________ seconds.  Release the tension slowly. Relax your shoulder muscles completely before you do the next repetition. Repeat __________ times. Complete this exercise __________ times per day.  STRENGTH - Internal Rotators, Isometric  Keep your right / left elbow at your side and bend it 90 degrees.  Step into a door frame so that the inside of your right / left wrist can press against the door frame without your upper arm leaving your side.  Gently press your right / left wrist into the door frame as if you were trying to draw the palm of your hand to your abdomen. Gradually increase the tension until you are pressing as hard as you can without shrugging your shoulder or increasing any shoulder discomfort.  Hold __________ seconds.  Release the tension slowly. Relax your shoulder muscles completely before you do the next repetition. Repeat __________ times. Complete this exercise __________ times per day.  STRENGTH - Scapular Protractors, Standing  Stand arms-length away from a wall. Place your hands on the wall,  keeping your elbows straight.  Begin by dropping your shoulder blades down and toward your mid-back spine.  To strengthen your protractors, keep your shoulder blades down, but slide them forward on your rib cage. It will feel as if you are lifting the back of your rib cage away from the wall. This is a subtle motion and can be challenging to complete. Ask your clinician for further instruction if you are not sure you are doing the exercise correctly.  Hold for __________ seconds. Slowly return to the starting position, resting the muscles completely before completing the next  repetition. Repeat __________ times. Complete this exercise __________ times per day. STRENGTH - Scapular Protractors, Supine  Lie on your back on a firm surface. Extend your right / left arm straight into the air while holding a __________ weight in your hand.  Keeping your head and back in place, lift your shoulder off the floor.  Hold __________ seconds. Slowly return to the starting position and allow your muscles to relax completely before completing the next repetition. Repeat __________ times. Complete this exercise __________ times per day. STRENGTH - Scapular Protractors, Quadruped  Get onto your hands and knees with your shoulders directly over your hands (or as close as you comfortably can be).  Keeping your elbows locked, lift the back of your rib cage up into your shoulder blades so your mid-back rounds-out. Keep your neck muscles relaxed.  Hold this position for __________ seconds. Slowly return to the starting position and allow your muscles to relax completely before completing the next repetition. Repeat __________ times. Complete this exercise __________ times per day.  STRENGTH - Scapular Depressors  Find a sturdy chair without wheels, such as a from a dining room table.  Keeping your feet on the floor, lift your bottom from the seat and lock your elbows.  Keeping your elbows straight, allow gravity to pull your body weight down. Your shoulders will rise toward your ears.  Raise your body against gravity by drawing your shoulder blades down your back, shortening the distance between your shoulders and ears. Although your feet should always maintain contact with the floor, your feet should progressively support less body weight as you get stronger.  Hold __________ seconds. In a controlled and slow manner, lower your body weight to begin the next repetition. Repeat __________ times. Complete this exercise __________ times per day.  STRENGTH - Scapular  Retractors  Secure a rubber exercise band/tubing so that it is at the height of your shoulders when you are either standing or sitting on a firm arm-less chair.  With a palm-down grip, grasp an end of the band/tubing in each hand. Straighten your elbows and lift your hands straight in front of you at shoulder height. Step back away from the secured end of band/tubing until it becomes tense.  Squeezing your shoulder blades together, draw your elbows back as you bend them. Keep your upper arm lifted away from your body throughout the exercise.  Hold __________ seconds. Slowly ease the tension on the band/tubing as you reverse the directions and return to the starting position. Repeat __________ times. Complete this exercise __________ times per day. STRENGTH - Shoulder Extensors  Secure a rubber exercise band/tubing so that it is at the height of your shoulders when you are either standing or sitting on a firm arm-less chair.  With a thumbs-up grip, grasp an end of the band/tubing in each hand. Straighten your elbows and lift your hands straight in front of you  at shoulder height. Step back away from the secured end of band/tubing until it becomes tense.  Squeezing your shoulder blades together, pull your hands down to the sides of your thighs. Do not allow your hands to go behind you.  Hold for __________ seconds. Slowly ease the tension on the band/tubing as you reverse the directions and return to the starting position. Repeat __________ times. Complete this exercise __________ times per day.  STRENGTH - Scapular Retractors and External Rotators  Secure a rubber exercise band/tubing so that it is at the height of your shoulders when you are either standing or sitting on a firm arm-less chair.  With a palm-down grip, grasp an end of the band/tubing in each hand. Bend your elbows 90 degrees and lift your elbows to shoulder height at your sides. Step back away from the secured end of  band/tubing until it becomes tense.  Squeezing your shoulder blades together, rotate your shoulder so that your upper arm and elbow remain stationary, but your fists travel upward to head-height.  Hold __________ for seconds. Slowly ease the tension on the band/tubing as you reverse the directions and return to the starting position. Repeat __________ times. Complete this exercise __________ times per day.  STRENGTH - Scapular Retractors and External Rotators, Rowing  Secure a rubber exercise band/tubing so that it is at the height of your shoulders when you are either standing or sitting on a firm arm-less chair.  With a palm-down grip, grasp an end of the band/tubing in each hand. Straighten your elbows and lift your hands straight in front of you at shoulder height. Step back away from the secured end of band/tubing until it becomes tense.  Step 1: Squeeze your shoulder blades together. Bending your elbows, draw your hands to your chest as if you are rowing a boat. At the end of this motion, your hands and elbow should be at shoulder-height and your elbows should be out to your sides.  Step 2: Rotate your shoulder to raise your hands above your head. Your forearms should be vertical and your upper-arms should be horizontal.  Hold for __________ seconds. Slowly ease the tension on the band/tubing as you reverse the directions and return to the starting position. Repeat __________ times. Complete this exercise __________ times per day.  STRENGTH - Internal Rotators  Secure a rubber exercise band/tubing to a fixed object so that it is at the same height as your right / left elbow when you are standing or sitting on a firm surface.  Stand or sit so that the secured exercise band/tubing is at your right / left side.  Bend your elbow 90 degrees. Place a folded towel or small pillow under your right / left arm so that your elbow is a few inches away from your side.  Keeping the tension on the  exercise band/tubing, pull it across your body toward your abdomen. Be sure to keep your body steady so that the movement is only coming from your shoulder rotating.  Hold __________ seconds. Release the tension in a controlled manner as you return to the starting position. Repeat __________ times. Complete this exercise __________ times per day.  STRENGTH - External Rotators  Secure a rubber exercise band/tubing to a fixed object so that it is at the same height as your right / left elbow when you are standing or sitting on a firm surface.  Stand or sit so that the secured exercise band/tubing is at your side that is not  injured.  Bend your elbow 90 degrees. Place a folded towel or small pillow under your right / left arm so that your elbow is a few inches away from your side.  Keeping the tension on the exercise band/tubing, pull it away from your body, as if pivoting on your elbow. Be sure to keep your body steady so that the movement is only coming from your shoulder rotating.  Hold __________ seconds. Release the tension in a controlled manner as you return to the starting position. Repeat __________ times. Complete this exercise __________ times per day.  STRENGTH - Supraspinatus  Stand or sit with good posture. Grasp a __________ lb weight or an exercise band/tubing so that your hand is "thumbs-up," like when you shake hands.  Slowly lift your right / left hand from your thigh into the air, traveling about 30 degrees from straight out at your side. Lift your hand to shoulder height or as far as you can without increasing any shoulder pain. Initially, many people do not lift their hands above shoulder height.  Avoid shrugging your right / left shoulder as your arm rises by keeping your shoulder blade tucked down and toward your mid-back spine.  Hold for __________ seconds. Control the descent of your hand as you slowly return to your starting position. Repeat __________ times. Complete  this exercise __________ times per day.  STRENGTH - Shoulder Abductors   Stand or sit with good posture. Place your right / left arm at your side.  With a thumbs-up grasp, hold a __________ weight or a secure rubber exercise band/tubing in your right / left hand. Slowly lift your arm from your side as far as you can without reproducing any of your shoulder pain. Do not lift your hand above shoulder-height unless you have been instructed to do so by your physician, physical therapist or athletic trainer. If this motion causes pain or increased discomfort, discuss this with your physician, physical therapist, or athletic trainer.  Avoid shrugging your right / left shoulder as your arm rises by keeping your shoulder blade tucked down and toward your mid-back spine.  Hold __________ seconds. Release the tension in a controlled manner as you return to the starting position. Repeat __________ times. Complete this exercise __________ times per day. Document Released: 11/08/2005 Document Revised: 01/31/2012 Document Reviewed: 02/20/2009 [facility] Patient Information 2014 [place], [place].

## 2013-12-31 NOTE — Progress Notes (Signed)
   Subjective:    Patient ID: Walter Wilkinson, male    DOB: 1971/09/23, 43 y.o.   MRN: [id number]  HPI 43 y.o. right handed male states arm numbness and dizziness for 7 days. He states that he woke up one morning 7 days ago and felt dizzy and his left hand feels like it is going to sleep and numb. He delivers pipe and drives a lot, his left arms is up on the steering wheel it will go numb, he puts it down and it feels better. When he bends over and raises up or turns really quickly, he feels like tunnel vision. States he ran out of his SSRI Citalopram 7 days ago.    Review of Systems  Constitutional: Positive for fatigue. Negative for fever and chills.  Eyes: Negative.   Respiratory: Negative.   Cardiovascular: Positive for palpitations. Negative for chest pain and leg swelling.  Gastrointestinal: Negative.   Genitourinary: Negative.   Neurological: Positive for dizziness and headaches.       Objective:   Physical Exam  Constitutional: He is oriented to person, place, and time. He appears well-developed and well-nourished.  HENT:  Head: Normocephalic and atraumatic.  Right Ear: External ear normal.  Left Ear: External ear normal.  Eyes: Conjunctivae and EOM are normal. Pupils are equal, round, and reactive to light.  Neck: Normal range of motion. Neck supple.  Cardiovascular: Normal rate, regular rhythm and normal heart sounds.   Pulmonary/Chest: Effort normal and breath sounds normal. He has no wheezes.  Abdominal: Soft. Bowel sounds are normal. There is no tenderness. There is no rebound and no guarding.  Musculoskeletal: Normal range of motion.  Neurological: He is alert and oriented to person, place, and time. He has normal strength. A sensory deficit (left 1st-3rd finger) is present. No cranial nerve deficit. He displays a negative Romberg sign.  Reflex Scores:      Tricep reflexes are 2+ on the right side and 1+ on the left side.      Bicep reflexes are 2+ on the right side and  1+ on the left side.      Brachioradialis reflexes are 2+ on the right side and 1+ on the left side.      Patellar reflexes are 2+ on the right side and 2+ on the left side.      Achilles reflexes are 2+ on the left side.     Assessment & Plan:  Likely SSRI withdrawal- normal neuro exam other than DTRs left arm- patient counseled on medications/compliance  Refill citalpram- needs to stay on it or taper off  Left arm- ? Likely cervical or shoulder impingement- monitor- exercise given. If it is not better we will get an Xray/send to ortho

## 2014-01-09 ENCOUNTER — Ambulatory Visit: Payer: Self-pay | Admitting: Physician Assistant

## 2014-01-29 ENCOUNTER — Encounter: Payer: Self-pay | Admitting: Physician Assistant

## 2014-01-29 ENCOUNTER — Ambulatory Visit (INDEPENDENT_AMBULATORY_CARE_PROVIDER_SITE_OTHER): Payer: BC Managed Care – PPO | Admitting: Physician Assistant

## 2014-01-29 VITALS — BP 138/88 | HR 88 | Temp 98.5°F | Resp 16 | Wt 210.0 lb

## 2014-01-29 DIAGNOSIS — E785 Hyperlipidemia, unspecified: Secondary | ICD-10-CM

## 2014-01-29 MED ORDER — FENOFIBRATE MICRONIZED 134 MG PO CAPS
134.0000 mg | ORAL_CAPSULE | Freq: Every day | ORAL | Status: DC
Start: 2014-01-29 — End: 2014-05-09

## 2014-01-29 NOTE — Patient Instructions (Signed)
Triglycerides, TG, TRIG This is a test to check your risk of developing heart disease. It is often done as part of a lipid profile during a regular medical exam or if you are being treated for high triglycerides. This test measures the amount of triglycerides in your blood. Triglycerides are the body's storage form for fat. Most triglycerides are found in fat tissue. Some triglycerides circulate in the blood to provide fuel for muscles to work. Extra triglycerides are found in the blood after eating a meal when fat is being sent from the gut to fat tissue for storage. The test for triglycerides should be done when you are fasting and no extra triglycerides from a recent meal are present.  SAMPLE COLLECTION The test for triglycerides uses a blood sample. Most often, the blood sample is collected using a needle to collect blood from a vein. Sometimes triglycerides are measured using a drop of blood collected by puncturing the skin on a finger. Testing should be done when you are fasting. For 12 to 14 hours before the test, only water is permitted. In addition, alcohol should not be consumed for the 24 hours just before the test. If you are diabetic and your blood sugar is out of control, triglycerides will be very high. NORMAL FINDINGS  Adult/elderly  Male: 40-160 mg/dL or 0.45-1.81 mmol/L (SI units)  Male: 35-135 mg/dL or 0.40-1.52 mmol/L (SI units)  0-43 years  Male: 30-86 mg/dL  Male: 32-99 mg/dL  6-43 years  Male: 31-108 mg/dL  Male: 35-114 mg/dL  12-43 years  Male: 36-138 mg/dL  Male: 41-138 mg/dL  16-43 years  Male: 40-163 mg/dL  Male: 40-128 mg/dL Ranges for normal findings may vary among different laboratories and hospitals. You should always check with your doctor after having lab work or other tests done to discuss the meaning of your test results and whether your values are considered within normal limits. MEANING OF TEST  Your caregiver will go over the test  results with you and discuss the importance and meaning of your results, as well as treatment options and the need for additional tests if necessary. OBTAINING THE TEST RESULTS It is your responsibility to obtain your test results. Ask the lab or department performing the test when and how you will get your results. Document Released: 12/11/2004 Document Revised: 01/31/2012 Document Reviewed: 10/20/2008 [employer] Patient Information 2014 [employer].  

## 2014-01-29 NOTE — Progress Notes (Signed)
HPI Patient presents for a one month follow up. He states his shoulder is better. He was started on fenofibrate for elevated trigs, and states he is not having any problems on it. He needs a refill. Last visit he was having anxiety, etc and he is doing better since back on Celexa.   Past Medical History  Diagnosis Date  . Dyslipidemia   . HTN (hypertension)      No Known Allergies    Current Outpatient Prescriptions on File Prior to Visit  Medication Sig Dispense Refill  . Cholecalciferol (VITAMIN D-3) 5000 UNITS TABS Take by mouth 2 (two) times daily.      . citalopram (CELEXA) 40 MG tablet Take 1 tablet (40 mg total) by mouth daily.  90 tablet  1  . fenofibrate micronized (LOFIBRA) 134 MG capsule Take 1 capsule (134 mg total) by mouth daily before breakfast.  30 capsule  3  . Fish Oil OIL Take 1 tablet by mouth daily.        Marland Kitchen. lisinopril (PRINIVIL,ZESTRIL) 20 MG tablet Take 20 mg by mouth daily.      . Potassium Chloride (KLOR-CON PO) Take by mouth as directed. 2 po dialy       . Pyridoxine HCl (VITAMIN B-6 PO) Take 1 tablet by mouth daily.        . vitamin B-12 (CYANOCOBALAMIN) 100 MCG tablet Take 100 mcg by mouth daily.         No current facility-administered medications on file prior to visit.    ROS: all negative expect above.   Physical: Filed Weights   01/29/14 1641  Weight: 210 lb (95.255 kg)   Filed Vitals:   01/29/14 1641  BP: 138/88  Pulse: 88  Temp: 98.5 F (36.9 C)  Resp: 16   General Appearance: Well nourished, in no apparent distress. Eyes: PERRLA, EOMs. Sinuses: No Frontal/maxillary tenderness ENT/Mouth: Ext aud canals clear, normal light reflex with TMs without erythema, bulging. Post pharynx without erythema, swelling, exudate.  Respiratory: CTAB Cardio: RRR, no murmurs, rubs or gallops. Peripheral pulses brisk and equal bilaterally, without edema. No aortic or femoral bruits. Abdomen: Soft, with bowl sounds. Nontender, no guarding,  rebound. Lymphatics: Non tender without lymphadenopathy.  Musculoskeletal: Full ROM all peripheral extremities, 5/5 strength, and normal gait. Skin: Warm, dry without rashes, lesions, ecchymosis.  Neuro: Cranial nerves intact, reflexes equal bilaterally. Normal muscle tone, no cerebellar symptoms. Sensation intact.  Pysch: Awake and oriented X 3, normal affect, Insight and Judgment appropriate.   Assessment and Plan: Hyperlipidemia- check trigs, refill fenofibrate Anxiety- continue celexa Neck better  Will follow up in Jun

## 2014-01-30 LAB — LIPID PANEL
CHOL/HDL RATIO: 6.9 ratio
Cholesterol: 194 mg/dL (ref 0–200)
HDL: 28 mg/dL — ABNORMAL LOW (ref >39)
TRIGLYCERIDES: 578 mg/dL — AB (ref <150)

## 2014-05-08 ENCOUNTER — Encounter: Payer: Self-pay | Admitting: Physician Assistant

## 2014-05-08 ENCOUNTER — Ambulatory Visit (INDEPENDENT_AMBULATORY_CARE_PROVIDER_SITE_OTHER): Payer: BC Managed Care – PPO | Admitting: Physician Assistant

## 2014-05-08 VITALS — BP 138/80 | HR 76 | Temp 97.7°F | Resp 16 | Ht 69.0 in | Wt 209.0 lb

## 2014-05-08 DIAGNOSIS — Z0000 Encounter for general adult medical examination without abnormal findings: Secondary | ICD-10-CM

## 2014-05-08 DIAGNOSIS — E785 Hyperlipidemia, unspecified: Secondary | ICD-10-CM

## 2014-05-08 DIAGNOSIS — F17200 Nicotine dependence, unspecified, uncomplicated: Secondary | ICD-10-CM

## 2014-05-08 DIAGNOSIS — I10 Essential (primary) hypertension: Secondary | ICD-10-CM

## 2014-05-08 LAB — CBC WITH DIFFERENTIAL/PLATELET
BASOS ABS: 0.1 10*3/uL (ref 0.0–0.1)
BASOS PCT: 1 % (ref 0–1)
EOS ABS: 0.2 10*3/uL (ref 0.0–0.7)
EOS PCT: 2 % (ref 0–5)
HCT: 46.7 % (ref 39.0–52.0)
Hemoglobin: 16.8 g/dL (ref 13.0–17.0)
LYMPHS PCT: 29 % (ref 12–46)
Lymphs Abs: 2.3 10*3/uL (ref 0.7–4.0)
MCH: 32.3 pg (ref 26.0–34.0)
MCHC: 36 g/dL (ref 30.0–36.0)
MCV: 89.8 fL (ref 78.0–100.0)
Monocytes Absolute: 0.4 10*3/uL (ref 0.1–1.0)
Monocytes Relative: 5 % (ref 3–12)
Neutro Abs: 5.1 10*3/uL (ref 1.7–7.7)
Neutrophils Relative %: 63 % (ref 43–77)
PLATELETS: 290 10*3/uL (ref 150–400)
RBC: 5.2 MIL/uL (ref 4.22–5.81)
RDW: 13.2 % (ref 11.5–15.5)
WBC: 8.1 10*3/uL (ref 4.0–10.5)

## 2014-05-08 NOTE — Patient Instructions (Addendum)
Preventative Care for Adults, Male       REGULAR HEALTH EXAMS:  A routine yearly physical is a good way to check in with your primary care provider about your health and preventive screening. It is also an opportunity to share updates about your health and any concerns you have, and receive a thorough all-over exam.   Most health insurance companies pay for at least some preventative services.  Check with your health plan for specific coverages.  WHAT PREVENTATIVE SERVICES DO MEN NEED?  Adult men should have their weight and blood pressure checked regularly.   Men age 35 and older should have their cholesterol levels checked regularly.  Beginning at age 50 and continuing to age 75, men should be screened for colorectal cancer.  Certain people should may need continued testing until age 85.  Other cancer screening may include exams for testicular and prostate cancer.  Updating vaccinations is part of preventative care.  Vaccinations help protect against diseases such as the flu.  Lab tests are generally done as part of preventative care to screen for anemia and blood disorders, to screen for problems with the kidneys and liver, to screen for bladder problems, to check blood sugar, and to check your cholesterol level.  Preventative services generally include counseling about diet, exercise, avoiding tobacco, drugs, excessive alcohol consumption, and sexually transmitted infections.    GENERAL RECOMMENDATIONS FOR GOOD HEALTH:  Healthy diet:  Eat a variety of foods, including fruit, vegetables, animal or vegetable protein, such as meat, fish, chicken, and eggs, or beans, lentils, tofu, and grains, such as rice.  Drink plenty of water daily.  Decrease saturated fat in the diet, avoid lots of red meat, processed foods, sweets, fast foods, and fried foods.  Exercise:  Aerobic exercise helps maintain good heart health. At least 30-40 minutes of moderate-intensity exercise is recommended.  For example, a brisk walk that increases your heart rate and breathing. This should be done on most days of the week.   Find a type of exercise or a variety of exercises that you enjoy so that it becomes a part of your daily life.  Examples are running, walking, swimming, water aerobics, and biking.  For motivation and support, explore group exercise such as aerobic class, spin class, Zumba, Yoga,or  martial arts, etc.    Set exercise goals for yourself, such as a certain weight goal, walk or run in a race such as a 5k walk/run.  Speak to your primary care provider about exercise goals.  Disease prevention:  If you smoke or chew tobacco, find out from your caregiver how to quit. It can literally save your life, no matter how long you have been a tobacco user. If you do not use tobacco, never begin.   Maintain a healthy diet and normal weight. Increased weight leads to problems with blood pressure and diabetes.   The Body Mass Index or BMI is a way of measuring how much of your body is fat. Having a BMI above 27 increases the risk of heart disease, diabetes, hypertension, stroke and other problems related to obesity. Your caregiver can help determine your BMI and based on it develop an exercise and dietary program to help you achieve or maintain this important measurement at a healthful level.  High blood pressure causes heart and blood vessel problems.  Persistent high blood pressure should be treated with medicine if weight loss and exercise do not work.   Fat and cholesterol leaves deposits in your arteries   that can block them. This causes heart disease and vessel disease elsewhere in your body.  If your cholesterol is found to be high, or if you have heart disease or certain other medical conditions, then you may need to have your cholesterol monitored frequently and be treated with medication.   Ask if you should have a stress test if your history suggests this. A stress test is a test done on  a treadmill that looks for heart disease. This test can find disease prior to there being a problem.  Avoid drinking alcohol in excess (more than two drinks per day).  Avoid use of street drugs. Do not share needles with anyone. Ask for professional help if you need assistance or instructions on stopping the use of alcohol, cigarettes, and/or drugs.  Brush your teeth twice a day with fluoride toothpaste, and floss once a day. Good oral hygiene prevents tooth decay and gum disease. The problems can be painful, unattractive, and can cause other health problems. Visit your dentist for a routine oral and dental check up and preventive care every 6-12 months.   Look at your skin regularly.  Use a mirror to look at your back. Notify your caregivers of changes in moles, especially if there are changes in shapes, colors, a size larger than a pencil eraser, an irregular border, or development of new moles.  Safety:  Use seatbelts 100% of the time, whether driving or as a passenger.  Use safety devices such as hearing protection if you work in environments with loud noise or significant background noise.  Use safety glasses when doing any work that could send debris in to the eyes.  Use a helmet if you ride a bike or motorcycle.  Use appropriate safety gear for contact sports.  Talk to your caregiver about gun safety.  Use sunscreen with a SPF (or skin protection factor) of 15 or greater.  Lighter skinned people are at a greater risk of skin cancer. Don't forget to also wear sunglasses in order to protect your eyes from too much damaging sunlight. Damaging sunlight can accelerate cataract formation.   Practice safe sex. Use condoms. Condoms are used for birth control and to help reduce the spread of sexually transmitted infections (or STIs).  Some of the STIs are gonorrhea (the clap), chlamydia, syphilis, trichomonas, herpes, HPV (human papilloma virus) and HIV (human immunodeficiency virus) which causes AIDS.  The herpes, HIV and HPV are viral illnesses that have no cure. These can result in disability, cancer and death.   Keep carbon monoxide and smoke detectors in your home functioning at all times. Change the batteries every 6 months or use a model that plugs into the wall.   Vaccinations:  Stay up to date with your tetanus shots and other required immunizations. You should have a booster for tetanus every 10 years. Be sure to get your flu shot every year, since 5%-20% of the [place] population comes down with the flu. The flu vaccine changes each year, so being vaccinated once is not enough. Get your shot in the fall, before the flu season peaks.   Other vaccines to consider:  Pneumococcal vaccine to protect against certain types of pneumonia.  This is normally recommended for adults age 65 or older.  However, adults younger than 43 years old with certain underlying conditions such as diabetes, heart or lung disease should also receive the vaccine.  Shingles vaccine to protect against Varicella Zoster if you are older than age 60, or younger   than 43 years old with certain underlying illness.  Hepatitis A vaccine to protect against a form of infection of the liver by a virus acquired from food.  Hepatitis B vaccine to protect against a form of infection of the liver by a virus acquired from blood or body fluids, particularly if you work in health care.  If you plan to travel internationally, check with your local health department for specific vaccination recommendations.  Cancer Screening:  Most routine colon cancer screening begins at the age of 49. On a yearly basis, doctors may provide special easy to use take-home tests to check for hidden blood in the stool. Sigmoidoscopy or colonoscopy can detect the earliest forms of colon cancer and is life saving. These tests use a small camera at the end of a tube to directly examine the colon. Speak to your caregiver about this at age 81, when routine  screening begins (and is repeated every 5 years unless early forms of pre-cancerous polyps or small growths are found).   At the age of 30 men usually start screening for prostate cancer every year. Screening may begin at a younger age for those with higher risk. Those at higher risk include African-Americans or having a family history of prostate cancer. There are two types of tests for prostate cancer:   Prostate-specific antigen (PSA) testing. Recent studies raise questions about prostate cancer using PSA and you should discuss this with your caregiver.   Digital rectal exam (in which your doctor's lubricated and gloved finger feels for enlargement of the prostate through the anus).   Screening for testicular cancer.  Do a monthly exam of your testicles. Gently roll each testicle between your thumb and fingers, feeling for any abnormal lumps. The best time to do this is after a hot shower or bath when the tissues are looser. Notify your caregivers of any lumps, tenderness or changes in size or shape immediately.    9 Ways to Naturally Increase Testosterone Levels  1.   Lose Weight If you're overweight, shedding the excess pounds may increase your testosterone levels, according to research presented at the Endocrine Society's 2012 meeting. Overweight men are more likely to have low testosterone levels to begin with, so this is an important trick to increase your body's testosterone production when you need it most.  2.   High-Intensity Exercise like Peak Fitness  Short intense exercise has a proven positive effect on increasing testosterone levels and preventing its decline. That's unlike aerobics or prolonged moderate exercise, which have shown to have negative or no effect on testosterone levels. Having a whey protein meal after exercise can further enhance the satiety/testosterone-boosting impact (hunger hormones cause the opposite effect on your testosterone and libido). Here's a summary of  what a typical high-intensity [employer] Fitness routine might look like: " Warm up for three minutes  " Exercise as hard and fast as you can for 30 seconds. You should feel like you couldn't possibly go on another few seconds  " Recover at a slow to moderate pace for 90 seconds  " Repeat the high intensity exercise and recovery 7 more times .  3.   Consume Plenty of Zinc The mineral zinc is important for testosterone production, and supplementing your diet for as little as six weeks has been shown to cause a marked improvement in testosterone among men with low levels.1 Likewise, research has shown that restricting dietary sources of zinc leads to a significant decrease in testosterone, while zinc supplementation increases  it2 -- and even protects men from exercised-induced reductions in testosterone levels.3 It's estimated that up to 45 percent of adults over the age of 60 may have lower than recommended zinc intakes; even when dietary supplements were added in, an estimated 20-25 percent of older adults still had inadequate zinc intakes, according to a [employer] and Nutrition Examination Survey.4 Your diet is the best source of zinc; along with protein-rich foods like meats and fish, other good dietary sources of zinc include raw milk, raw cheese, beans, and yogurt or kefir made from raw milk. It can be difficult to obtain enough dietary zinc if you're a vegetarian, and also for meat-eaters as well, largely because of conventional farming methods that rely heavily on chemical fertilizers and pesticides. These chemicals deplete the soil of nutrients ... nutrients like zinc that must be absorbed by plants in order to be passed on to you. In many cases, you may further deplete the nutrients in your food by the way you prepare it. For most food, cooking it will drastically reduce its levels of nutrients like zinc . particularly over-cooking, which many people do. If you decide to use a zinc supplement,  stick to a dosage of less than 40 mg a day, as this is the recommended adult upper limit. Taking too much zinc can interfere with your body's ability to absorb other minerals, especially copper, and may cause nausea as a side effect.  4.   Strength Training In addition to [employer]itness, strength training is also known to boost testosterone levels, provided you are doing so intensely enough. When strength training to boost testosterone, you'll want to increase the weight and lower your number of reps, and then focus on exercises that work a large number of muscles, such as dead lifts or squats.  You can "turbo-charge" your weight training by going slower. By slowing down your movement, you're actually turning it into a high-intensity exercise. Super Slow movement allows your muscle, at the microscopic level, to access the maximum number of cross-bridges between the protein filaments that produce movement in the muscle.   5.   Optimize Your Vitamin D Levels Vitamin D, a steroid hormone, is essential for the healthy development of the nucleus of the sperm cell, and helps maintain semen quality and sperm count. Vitamin D also increases levels of testosterone, which may boost libido. In one study, overweight men who were given vitamin D supplements had a significant increase in testosterone levels after one year.5   6.   Reduce Stress When you're under a lot of stress, your body releases high levels of the stress hormone cortisol. This hormone actually blocks the effects of testosterone,6 presumably because, from a biological standpoint, testosterone-associated behaviors (mating, competing, aggression) may have lowered your chances of survival in an emergency (hence, the "fight or flight" response is dominant, courtesy of cortisol).  7.   Limit or Eliminate Sugar from Your Diet Testosterone levels decrease after you eat sugar, which is likely because the sugar leads to a high insulin level, another factor  leading to low testosterone.7 Based on USDA estimates, the average American consumes 12 teaspoons of sugar a day, which equates to about TWO TONS of sugar during a lifetime.  8.   Eat Healthy Fats By healthy, this means not only mon- and polyunsaturated fats, like that found in avocadoes and nuts, but also saturated, as these are essential for building testosterone. Research shows that a diet with less than 40 percent of energy as  fat (and that mainly from animal sources, i.e. saturated) lead to a decrease in testosterone levels.8 My personal diet is about 60-70 percent healthy fat, and other experts agree that the ideal diet includes somewhere between 50-70 percent fat.  It's important to understand that your body requires saturated fats from animal and vegetable sources (such as meat, dairy, certain oils, and tropical plants like coconut) for optimal functioning, and if you neglect this important food group in favor of sugar, grains and other starchy carbs, your health and weight are almost guaranteed to suffer. Examples of healthy fats you can eat more of to give your testosterone levels a boost include: Olives and Olive oil  Coconuts and coconut oil Butter made from raw grass-fed organic milk Raw nuts, such as, almonds or pecans Organic pastured egg yolks Avocados Grass-fed meats Palm oil Unheated organic nut oils   9.   Boost Your Intake of Branch Chain Amino Acids (BCAA) from Foods Like Whey Protein Research suggests that BCAAs result in higher testosterone levels, particularly when taken along with resistance training.9 While BCAAs are available in supplement form, you'll find the highest concentrations of BCAAs like leucine in dairy products - especially quality cheeses and whey protein. Even when getting leucine from your natural food supply, it's often wasted or used as a building block instead of an anabolic agent. So to create the correct anabolic environment, you need to boost leucine  consumption way beyond mere maintenance levels. That said, keep in mind that using leucine as a free form amino acid can be highly counterproductive as when free form amino acids are artificially administrated, they rapidly enter your circulation while disrupting insulin function, and impairing your body's glycemic control. Food-based leucine is really the ideal form that can benefit your muscles without side effects.

## 2014-05-08 NOTE — Progress Notes (Signed)
Complete Physical  Assessment and Plan: Dyslipidemia--continue medications, check lipids, decrease fatty foods, increase activity.   HTN (hypertension)-- continue medications, DASH diet, exercise and monitor at home. Call if greater than 130/80.   Obesity with co morbidities- long discussion about weight loss, diet, and exercise Hypogonadism- check, may want treatment Depression/anxiety- cont celexa  Discussed med's effects and SE's. Screening labs and tests as requested with regular follow-up as recommended.  HPI 43 y.o. male  presents for a complete physical. His blood pressure has been controlled at home, today their BP is BP: 138/80 mmHg He does workout. He denies chest pain, shortness of breath, dizziness.  He is not on cholesterol medication, but suppose to be on fenofibrate but has been out for about 6 months. His cholesterol is not at goal. The cholesterol last visit was:   Lab Results  Component Value Date   CHOL 194 01/29/2014   HDL 28* 01/29/2014   LDLCALC NOT CALC 01/29/2014   TRIG 578* 01/29/2014   CHOLHDL 6.9 01/29/2014   He has been working on diet and exercise for prediabetes and has decreased drinking, and denies paresthesia of the feet and polydipsia. Last A1C in the office was: 5.1 and insulin 41.  Last testosterone was 233 but he is not on treatment.  Patient is on Vitamin D supplement.   Current Medications:  Current Outpatient Prescriptions on File Prior to Visit  Medication Sig Dispense Refill  . Cholecalciferol (VITAMIN D-3) 5000 UNITS TABS Take by mouth 2 (two) times daily.      . citalopram (CELEXA) 40 MG tablet Take 1 tablet (40 mg total) by mouth daily.  90 tablet  1  . fenofibrate micronized (LOFIBRA) 134 MG capsule Take 1 capsule (134 mg total) by mouth daily before breakfast.  30 capsule  4  . Fish Oil OIL Take 1 tablet by mouth daily.        Marland Kitchen. lisinopril (PRINIVIL,ZESTRIL) 20 MG tablet Take 20 mg by mouth daily.      . Potassium Chloride (KLOR-CON PO)  Take by mouth as directed. 2 po dialy       . Pyridoxine HCl (VITAMIN B-6 PO) Take 1 tablet by mouth daily.        . vitamin B-12 (CYANOCOBALAMIN) 100 MCG tablet Take 100 mcg by mouth daily.         No current facility-administered medications on file prior to visit.   Health Maintenance:  Immunization History  Administered Date(s) Administered  . Tdap 10/22/2013   Tetanus: 2014 Pneumovax: N/A Flu vaccine:declines Zostavax:N/A DEXA:N/A Colonoscopy: N/A EGD: N/A  Allergies: No Known Allergies Medical History:  Past Medical History  Diagnosis Date  . Dyslipidemia   . HTN (hypertension)    Surgical History:  Past Surgical History  Procedure Laterality Date  . Vasectomy     Family History:  Family History  Problem Relation Age of Onset  . Heart attack Father     in his 5840s   Social History:   History  Substance Use Topics  . Smoking status: Never Smoker   . Smokeless tobacco: Not on file     Comment: no cigarettes; occasionally uses smokeless tobacco  . Alcohol Use: Yes     Comment: rare   ROS:  [X]  = complains of  [ ]  = denies  General: Fatigue [ ]  Fever [ ]  Chills [ ]  Weakness [ ]   Insomnia [ ]  Weight change [ ]  Night sweats [ ]   Change in appetite [ ]  Eyes:  Redness [ ]  Blurred vision [ ]  Diplopia [ ]  Discharge [ ]   ENT: Congestion [ ]  Sinus Pain [ ]  Post Nasal Drip [ ]  Sore Throat [ ]  Earache [ ]  hearing loss [ ]  Tinnitus [ ]  Snoring [ ]   Cardiac: Chest pain/pressure [ ]  SOB [ ]  Orthopnea [ ]   Palpitations [ ]   Paroxysmal nocturnal dyspnea[ ]  Claudication [ ]  Edema [ ]   Pulmonary: Cough [ ]  Wheezing[ ]   SOB [ ]   Pleurisy [ ]   GI: Nausea [ ]  Vomiting[ ]  Dysphagia[ ]  Heartburn[ ]  Abdominal pain [ ]  Constipation [ ] ; Diarrhea [ ]  BRBPR [ ]  Melena[ ]  Bloating [ ]  Hemorrhoids [ ]   GU: Hematuria[ ]  Dysuria [ ]  Nocturia[ ]  Urgency [ ]   Hesitancy [ ]  Discharge [ ]  Frequency [ ]   Neuro: Headaches[ ]  Vertigo[ ]  Paresthesias[ ]  Spasm [ ]  Speech changes [ ]  Incoordination [  ]  Ortho: Arthritis [ ]  Joint pain [ ]  Muscle pain [ ]  Joint swelling [ ]  Back Pain [ ]  Skin:  Rash [ ]   Pruritis [ ]  Change in skin lesion [ ]   Psych: Depression[ ]  Anxiety[ ]  Confusion [ ]  Memory loss [ ]   Heme/Lypmh: Bleeding [ ]  Bruising [ ]  Enlarged lymph nodes [ ]   Endocrine: Visual blurring [ ]  Paresthesia [ ]  Polyuria [ ]  Polydypsea [ ]    Heat/cold intolerance [ ]  Hypoglycemia [ ]   Physical Exam: Estimated body mass index is 30.85 kg/(m^2) as calculated from the following:   Height as of this encounter: 5\' 9"  (1.753 m).   Weight as of this encounter: 209 lb (94.802 kg). BP 138/80  Pulse 76  Temp(Src) 97.7 F (36.5 C)  Resp 16  Ht 5\' 9"  (1.753 m)  Wt 209 lb (94.802 kg)  BMI 30.85 kg/m2 General Appearance: Well nourished, in no apparent distress. Eyes: PERRLA, EOMs, conjunctiva no swelling or erythema, normal fundi and vessels. Sinuses: No Frontal/maxillary tenderness ENT/Mouth: Ext aud canals clear, normal light reflex with TMs without erythema, bulging. Good dentition. No erythema, swelling, or exudate on post pharynx. Tonsils not swollen or erythematous. Hearing normal.  Neck: Supple, thyroid normal. No bruits Respiratory: Respiratory effort normal, BS equal bilaterally without rales, rhonchi, wheezing or stridor. Cardio: RRR without murmurs, rubs or gallops. Brisk peripheral pulses without edema.  Chest: symmetric, with normal excursions and percussion. Abdomen: Soft, +BS. Non tender, no guarding, rebound, hernias, masses, or organomegaly. .  Lymphatics: Non tender without lymphadenopathy.  Genitourinary: defer Musculoskeletal: Full ROM all peripheral extremities,5/5 strength, and normal gait. Skin: Warm, dry without rashes, lesions, ecchymosis.  Neuro: Cranial nerves intact, reflexes equal bilaterally. Normal muscle tone, no cerebellar symptoms. Sensation intact.  Psych: Awake and oriented X 3, normal affect, Insight and Judgment appropriate.   EKG: WNL no  changes.  Quentin Mullingollier, Amanda 2:33 PM

## 2014-05-09 LAB — IRON AND TIBC
%SAT: 25 % (ref 20–55)
IRON: 82 ug/dL (ref 42–165)
TIBC: 331 ug/dL (ref 215–435)
UIBC: 249 ug/dL (ref 125–400)

## 2014-05-09 LAB — BASIC METABOLIC PANEL WITH GFR
BUN: 10 mg/dL (ref 6–23)
CALCIUM: 10.4 mg/dL (ref 8.4–10.5)
CHLORIDE: 96 meq/L (ref 96–112)
CO2: 27 mEq/L (ref 19–32)
CREATININE: 0.96 mg/dL (ref 0.50–1.35)
Glucose, Bld: 89 mg/dL (ref 70–99)
Potassium: 4 mEq/L (ref 3.5–5.3)
Sodium: 136 mEq/L (ref 135–145)

## 2014-05-09 LAB — MICROALBUMIN / CREATININE URINE RATIO
Creatinine, Urine: 24.2 mg/dL
Microalb Creat Ratio: 20.7 mg/g (ref 0.0–30.0)
Microalb, Ur: 0.5 mg/dL (ref 0.00–1.89)

## 2014-05-09 LAB — URINALYSIS, ROUTINE W REFLEX MICROSCOPIC
BILIRUBIN URINE: NEGATIVE
GLUCOSE, UA: NEGATIVE mg/dL
HGB URINE DIPSTICK: NEGATIVE
KETONES UR: NEGATIVE mg/dL
Leukocytes, UA: NEGATIVE
Nitrite: NEGATIVE
PROTEIN: NEGATIVE mg/dL
Specific Gravity, Urine: <1.005 — ABNORMAL LOW (ref 1.005–1.030)
UROBILINOGEN UA: 0.2 mg/dL (ref 0.0–1.0)
pH: 7 (ref 5.0–8.0)

## 2014-05-09 LAB — HEPATIC FUNCTION PANEL
ALT: 28 U/L (ref 0–53)
AST: 27 U/L (ref 0–37)
Albumin: 4.6 g/dL (ref 3.5–5.2)
Alkaline Phosphatase: 74 U/L (ref 39–117)
BILIRUBIN TOTAL: 1.1 mg/dL (ref 0.2–1.2)
Bilirubin, Direct: 0.1 mg/dL (ref 0.0–0.3)
Indirect Bilirubin: 1 mg/dL (ref 0.2–1.2)
Total Protein: 7.8 g/dL (ref 6.0–8.3)

## 2014-05-09 LAB — TESTOSTERONE: TESTOSTERONE: 198 ng/dL — AB (ref 300–890)

## 2014-05-09 LAB — LIPID PANEL
CHOL/HDL RATIO: 7.1 ratio
CHOLESTEROL: 193 mg/dL (ref 0–200)
HDL: 27 mg/dL — AB (ref >39)
Triglycerides: 679 mg/dL — ABNORMAL HIGH (ref <150)

## 2014-05-09 LAB — VITAMIN D 25 HYDROXY (VIT D DEFICIENCY, FRACTURES): Vit D, 25-Hydroxy: 73 ng/mL (ref 30–89)

## 2014-05-09 LAB — MAGNESIUM: MAGNESIUM: 1.9 mg/dL (ref 1.5–2.5)

## 2014-05-09 LAB — INSULIN, FASTING: Insulin fasting, serum: 26 u[IU]/mL (ref 3–28)

## 2014-05-09 LAB — HEMOGLOBIN A1C
HEMOGLOBIN A1C: 5.4 % (ref <5.7)
MEAN PLASMA GLUCOSE: 108 mg/dL (ref <117)

## 2014-05-09 LAB — TSH: TSH: 1.73 u[IU]/mL (ref 0.350–4.500)

## 2014-05-09 LAB — VITAMIN B12: VITAMIN B 12: 446 pg/mL (ref 211–911)

## 2014-05-09 LAB — FERRITIN: Ferritin: 246 ng/mL (ref 22–322)

## 2014-05-09 MED ORDER — FENOFIBRATE MICRONIZED 134 MG PO CAPS: 134.0000 mg | ORAL_CAPSULE | Freq: Every day | ORAL | Status: DC

## 2014-05-09 NOTE — Addendum Note (Signed)
Addended by: Quentin MullingOLLIER, AMANDA R on: 05/09/2014 08:08 AM   Modules accepted: Orders

## 2014-07-11 ENCOUNTER — Other Ambulatory Visit: Payer: Self-pay | Admitting: Physician Assistant

## 2014-07-22 ENCOUNTER — Other Ambulatory Visit: Payer: Self-pay | Admitting: Physician Assistant

## 2014-08-07 ENCOUNTER — Ambulatory Visit (INDEPENDENT_AMBULATORY_CARE_PROVIDER_SITE_OTHER): Payer: BC Managed Care – PPO | Admitting: Emergency Medicine

## 2014-08-07 ENCOUNTER — Encounter: Payer: Self-pay | Admitting: Emergency Medicine

## 2014-08-07 VITALS — BP 158/106 | HR 108 | Temp 98.0°F | Resp 18 | Ht 69.0 in | Wt 213.0 lb

## 2014-08-07 DIAGNOSIS — R0789 Other chest pain: Secondary | ICD-10-CM

## 2014-08-07 DIAGNOSIS — R071 Chest pain on breathing: Secondary | ICD-10-CM

## 2014-08-07 MED ORDER — MELOXICAM 15 MG PO TABS: 15.0000 mg | ORAL_TABLET | Freq: Every day | ORAL | Status: DC

## 2014-08-07 NOTE — Patient Instructions (Signed)
Chest Wall Pain °Chest wall pain is pain felt in or around the chest bones and muscles. It may take up to 6 weeks to get better. It may take longer if you are active. Chest wall pain can happen on its own. Other times, things like germs, injury, coughing, or exercise can cause the pain. °HOME CARE  °· Avoid activities that make you tired or cause pain. Try not to use your chest, belly (abdominal), or side muscles. Do not use heavy weights. °· Put ice on the sore area. °¨ Put ice in a plastic bag. °¨ Place a towel between your skin and the bag. °¨ Leave the ice on for 15-20 minutes for the first 2 days. °· Only take medicine as told by your doctor. °GET HELP RIGHT AWAY IF:  °· You have more pain or are very uncomfortable. °· You have a fever. °· Your chest pain gets worse. °· You have new problems. °· You feel sick to your stomach (nauseous) or throw up (vomit). °· You start to sweat or feel lightheaded. °· You have a cough with mucus (phlegm). °· You cough up blood. °MAKE SURE YOU:  °· Understand these instructions. °· Will watch your condition. °· Will get help right away if you are not doing well or get worse. °Document Released: 04/26/2008 Document Revised: 01/31/2012 Document Reviewed: 07/05/2011 °[employer]® Patient Information ©2015 [employer]. This information is not intended to replace advice given to you by your health care provider. Make sure you discuss any questions you have with your health care provider. ° °

## 2014-08-07 NOTE — Progress Notes (Signed)
   Subjective:    Patient ID: Walter Wilkinson, male    DOB: 1971/10/30, 43 y.o.   MRN: [id number]  HPI Comments: 43 yo WM with LUQ pain since earlier this week. He notes worse pain if sits straight up. Pain 12/10 when sits in that position. He denies injury but strains on a routine basis with work around the house. He denies recent illness or trouble with BM.  Abdominal Pain     Medication List       This list is accurate as of: 08/07/14 11:59 PM.  Always use your most recent med list.               citalopram 40 MG tablet  Commonly known as:  CELEXA  TAKE ONE TABLET BY MOUTH ONCE DAILY     fenofibrate micronized 134 MG capsule  Commonly known as:  LOFIBRA  Take 1 capsule (134 mg total) by mouth daily before breakfast.     Fish Oil Oil  Take 1 tablet by mouth daily.     KLOR-CON PO  Take by mouth as directed. 2 po dialy     lisinopril 20 MG tablet  Commonly known as:  PRINIVIL,ZESTRIL  TAKE ONE TABLET BY MOUTH ONCE DAILY FOR BLOOD PRESSURE     meloxicam 15 MG tablet  Commonly known as:  MOBIC  Take 1 tablet (15 mg total) by mouth daily.     vitamin B-12 100 MCG tablet  Commonly known as:  CYANOCOBALAMIN  Take 100 mcg by mouth daily.     VITAMIN B-6 PO  Take 1 tablet by mouth daily.     Vitamin D-3 5000 UNITS Tabs  Take by mouth 2 (two) times daily.      No Known Allergies     Review of Systems  Cardiovascular: Positive for chest pain.  Gastrointestinal: Positive for abdominal pain.  All other systems reviewed and are negative.  BP 158/106  Pulse 108  Temp(Src) 98 F (36.7 C) (Temporal)  Resp 18  Ht  (1.753 m)  Wt 213 lb (96.616 kg)  BMI 31.44 kg/m2 Recheck 153/89    Objective:   Physical Exam  Nursing note and vitals reviewed. Constitutional: He is oriented to person, place, and time. He appears well-developed and well-nourished.  HENT:  Head: Normocephalic and atraumatic.  Right Ear: External ear normal.  Left Ear: External ear normal.   Nose: Nose normal.  Mouth/Throat: Oropharynx is clear and moist. No oropharyngeal exudate.  Eyes: Conjunctivae are normal.  Neck: Normal range of motion.  Cardiovascular: Normal rate, regular rhythm, normal heart sounds and intact distal pulses.   Pulmonary/Chest: Effort normal and breath sounds normal. He exhibits tenderness.  Left lateral chest wall reproducible with certain positions.  Abdominal: Soft. Bowel sounds are normal. There is tenderness.  LUQ, lower chest wall  Musculoskeletal: Normal range of motion.  Lymphadenopathy:    He has no cervical adenopathy.  Neurological: He is alert and oriented to person, place, and time.  Skin: Skin is warm and dry. No rash noted.  Psychiatric: He has a normal mood and affect. Judgment normal.    EKG NSCSPT WNL      Assessment & Plan:  Chest wall pain - Plan: EKG 12-Lead, meloxicam (MOBIC) 15 MG tablet Advised probable muscle strain w/c if SX increase or ER. Advised no straining x 5 days. Heat light stretching/ Ice.

## 2014-08-19 ENCOUNTER — Encounter: Payer: Self-pay | Admitting: Physician Assistant

## 2014-08-19 ENCOUNTER — Ambulatory Visit (INDEPENDENT_AMBULATORY_CARE_PROVIDER_SITE_OTHER): Payer: BC Managed Care – PPO | Admitting: Physician Assistant

## 2014-08-19 VITALS — BP 140/100 | HR 88 | Temp 97.7°F | Resp 16 | Ht 69.0 in | Wt 211.0 lb

## 2014-08-19 DIAGNOSIS — I10 Essential (primary) hypertension: Secondary | ICD-10-CM

## 2014-08-19 DIAGNOSIS — E559 Vitamin D deficiency, unspecified: Secondary | ICD-10-CM

## 2014-08-19 DIAGNOSIS — F17200 Nicotine dependence, unspecified, uncomplicated: Secondary | ICD-10-CM

## 2014-08-19 DIAGNOSIS — Z720 Tobacco use: Secondary | ICD-10-CM

## 2014-08-19 DIAGNOSIS — E785 Hyperlipidemia, unspecified: Secondary | ICD-10-CM

## 2014-08-19 DIAGNOSIS — Z79899 Other long term (current) drug therapy: Secondary | ICD-10-CM

## 2014-08-19 MED ORDER — FENOFIBRATE MICRONIZED 134 MG PO CAPS: 134.0000 mg | ORAL_CAPSULE | Freq: Every day | ORAL | Status: DC

## 2014-08-19 MED ORDER — ALPRAZOLAM 0.5 MG PO TABS: 0.5000 mg | ORAL_TABLET | Freq: Two times a day (BID) | ORAL | Status: DC | PRN

## 2014-08-19 MED ORDER — LISINOPRIL 20 MG PO TABS: ORAL_TABLET | ORAL | Status: DC

## 2014-08-19 MED ORDER — CITALOPRAM HYDROBROMIDE 40 MG PO TABS: ORAL_TABLET | ORAL | Status: DC

## 2014-08-19 NOTE — Progress Notes (Signed)
Assessment and Plan:  Hypertension: Continue medication, monitor blood pressure at home, questionable white coat syndrome, if greater than 140/90 than increase lisinopril. Continue DASH diet. Cholesterol: Continue diet and exercise. Check cholesterol.  Vitamin D Def- check level and continue medications.  Anxiety- continue medications, will refill xanax 0.5mg  #30 NR, stress management techniques discussed, increase water, good sleep hygiene discussed, increase exercise, and increase veggies.    Continue diet and meds as discussed. Further disposition pending results of labs.  HPI 43 y.o. male  presents for 3 month follow up with hypertension, hyperlipidemia, prediabetes and vitamin D. His blood pressure has been controlled at home, today their BP is BP: 140/100 mmHg He does not workout. He denies chest pain, shortness of breath, dizziness.  He is on cholesterol medication, fenofibrate but has been out for 2 months. He has cut back on alcohol.  His cholesterol is not at goal. The cholesterol last visit was:   Lab Results  Component Value Date   CHOL 193 05/08/2014   HDL 27* 05/08/2014   LDLCALC NOT CALC 05/08/2014   TRIG 679* 05/08/2014   CHOLHDL 7.1 05/08/2014   Last A1C in the office was:  Lab Results  Component Value Date   HGBA1C 5.4 05/08/2014   Patient is on Vitamin D supplement.   Lab Results  Component Value Date   VD25OH 73 05/08/2014     Chest pain/pulled muscle is better.  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 Increasing anxiety, was given xanax 0.5mg  #21 May 2013 and just ran out, asking for refills, he is still on citalopram.   Current Medications:  Current Outpatient Prescriptions on File Prior to Visit  Medication Sig Dispense Refill  . Cholecalciferol (VITAMIN D-3) 5000 UNITS TABS Take by mouth 2 (two) times daily.      . citalopram (CELEXA) 40 MG tablet TAKE ONE TABLET BY MOUTH ONCE  DAILY  90 tablet  1  . fenofibrate micronized (LOFIBRA) 134 MG capsule Take 1 capsule (134 mg total) by mouth daily before breakfast.  30 capsule  4  . Fish Oil OIL Take 1 tablet by mouth daily.        Marland Kitchen lisinopril (PRINIVIL,ZESTRIL) 20 MG tablet TAKE ONE TABLET BY MOUTH ONCE DAILY FOR BLOOD PRESSURE  90 tablet  0  . meloxicam (MOBIC) 15 MG tablet Take 1 tablet (15 mg total) by mouth daily.  10 tablet  0  . Potassium Chloride (KLOR-CON PO) Take by mouth as directed. 2 po dialy       . Pyridoxine HCl (VITAMIN B-6 PO) Take 1 tablet by mouth daily.        . vitamin B-12 (CYANOCOBALAMIN) 100 MCG tablet Take 100 mcg by mouth daily.         No current facility-administered medications on file prior to visit.   Medical History:  Past Medical History  Diagnosis Date  . Dyslipidemia   . HTN (hypertension)    Allergies: No Known Allergies   Review of Systems:  = complains of   = denies  General: Fatigue  Fever  Chills  Weakness   Insomnia  Eyes: Redness  Blurred vision  Diplopia   ENT: Congestion  Sinus Pain  Post Nasal Drip  Sore Throat  Earache   Cardiac: Chest pain/pressure  SOB  Orthopnea   Palpitations   Paroxysmal nocturnal dyspnea[ ]  Claudication  Edema   Pulmonary: Cough  Wheezing[ ]   SOB   Snoring   GI: Nausea  Vomiting[ ]  Dysphagia[ ]  Heartburn[ ]  Abdominal pain  Constipation ; Diarrhea ; BRBPR  Melena[ ]  GU: Hematuria[ ]  Dysuria  Nocturia[ ]  Urgency   Hesitancy  Discharge  Neuro: Headaches[ ]  Vertigo[ ]  Paresthesias[ ]  Spasm  Speech changes  Incoordination   Ortho: Arthritis  Joint pain  Muscle pain  Joint swelling  Back Pain  Skin:  Rash   Pruritis  Change in skin lesion   Psych: Depression[ ]  Anxiety[ ]  Confusion  Memory loss   Heme/Lypmh: Bleeding  Bruising  Enlarged lymph nodes   Endocrine: Visual blurring  Paresthesia  Polyuria   Polydypsea    Heat/cold intolerance  Hypoglycemia   Family history- Review and unchanged Social history- Review and unchanged Physical Exam: BP 140/100  Pulse 88  Temp(Src) 97.7 F (36.5 C)  Resp 16  Ht  (1.753 m)  Wt 211 lb (95.709 kg)  BMI 31.15 kg/m2 Wt Readings from Last 3 Encounters:  08/19/14 211 lb (95.709 kg)  08/07/14 213 lb (96.616 kg)  05/08/14 209 lb (94.802 kg)   General Appearance: Well nourished, in no apparent distress. Eyes: PERRLA, EOMs, conjunctiva no swelling or erythema Sinuses: No Frontal/maxillary tenderness ENT/Mouth: Ext aud canals clear, TMs without erythema, bulging. No erythema, swelling, or exudate on post pharynx.  Tonsils not swollen or erythematous. Hearing normal.  Neck: Supple, thyroid normal.  Respiratory: Respiratory effort normal, BS equal bilaterally without rales, rhonchi, wheezing or stridor.  Cardio: RRR with no MRGs. Brisk peripheral pulses without edema.  Abdomen: Soft, + BS.  Non tender, no guarding, rebound, hernias, masses. Lymphatics: Non tender without lymphadenopathy.  Musculoskeletal: Full ROM, 5/5 strength, normal gait.  Skin: Warm, dry without rashes, lesions, ecchymosis.  Neuro: Cranial nerves intact. Normal muscle tone, no cerebellar symptoms. Sensation intact.  Psych: Awake and oriented X 3, normal affect, Insight and Judgment appropriate.    Quentin Mulling 5:14 PM

## 2014-08-19 NOTE — Patient Instructions (Addendum)
Triglycerides are simple fats in blood that are converted into a storage form. I recommend you avoid fried/greasy foods, sweets/candy, white rice , white potatoes,  anything made from white flour, sweet tea, soda, fruit juices and avoid alcohol in excess. Sweet potatoes, brown/wild rice/Quinoa, Vegetarian, spinach, or wheat pasta, Multi-grain bread - like multi-grain flat bread or sandwich thins are okay.  Check BP at home, if BP is 140/90 at home than please call the office so we can increase your BP.   DASH Eating Plan DASH stands for "Dietary Approaches to Stop Hypertension." The DASH eating plan is a healthy eating plan that has been shown to reduce high blood pressure (hypertension). Additional health benefits may include reducing the risk of type 2 diabetes mellitus, heart disease, and stroke. The DASH eating plan may also help with weight loss. WHAT DO I NEED TO KNOW ABOUT THE DASH EATING PLAN? For the DASH eating plan, you will follow these general guidelines:  Choose foods with a percent daily value for sodium of less than 5% (as listed on the food label).  Use salt-free seasonings or herbs instead of table salt or sea salt.  Check with your health care provider or pharmacist before using salt substitutes.  Eat lower-sodium products, often labeled as "lower sodium" or "no salt added."  Eat fresh foods.  Eat more vegetables, fruits, and low-fat dairy products.  Choose whole grains. Look for the word "whole" as the first word in the ingredient list.  Choose fish and skinless chicken or [place] more often than red meat. Limit fish, poultry, and meat to 6 oz (170 g) each day.  Limit sweets, desserts, sugars, and sugary drinks.  Choose heart-healthy fats.  Limit cheese to 1 oz (28 g) per day.  Eat more home-cooked food and less restaurant, buffet, and fast food.  Limit fried foods.  Cook foods using methods other than frying.  Limit canned vegetables. If you do use them, rinse  them well to decrease the sodium.  When eating at a restaurant, ask that your food be prepared with less salt, or no salt if possible. WHAT FOODS CAN I EAT? Seek help from a dietitian for individual calorie needs. Grains Whole grain or whole wheat bread. Brown rice. Whole grain or whole wheat pasta. Quinoa, bulgur, and whole grain cereals. Low-sodium cereals. Corn or whole wheat flour tortillas. Whole grain cornbread. Whole grain crackers. Low-sodium crackers. Vegetables Fresh or frozen vegetables (raw, steamed, roasted, or grilled). Low-sodium or reduced-sodium tomato and vegetable juices. Low-sodium or reduced-sodium tomato sauce and paste. Low-sodium or reduced-sodium canned vegetables.  Fruits All fresh, canned (in natural juice), or frozen fruits. Meat and Other Protein Products Ground beef (85% or leaner), grass-fed beef, or beef trimmed of fat. Skinless chicken or [place]. Ground chicken or [place]. Pork trimmed of fat. All fish and seafood. Eggs. Dried beans, peas, or lentils. Unsalted nuts and seeds. Unsalted canned beans. Dairy Low-fat dairy products, such as skim or 1% milk, 2% or reduced-fat cheeses, low-fat ricotta or cottage cheese, or plain low-fat yogurt. Low-sodium or reduced-sodium cheeses. Fats and Oils Tub margarines without trans fats. Light or reduced-fat mayonnaise and salad dressings (reduced sodium). Avocado. Safflower, olive, or canola oils. Natural peanut or almond butter. Other Unsalted popcorn and pretzels. The items listed above may not be a complete list of recommended foods or beverages. Contact your dietitian for more options. WHAT FOODS ARE NOT RECOMMENDED? Grains White bread. White pasta. White rice. Refined cornbread. Bagels and croissants. Crackers that contain  trans fat. Vegetables Creamed or fried vegetables. Vegetables in a cheese sauce. Regular canned vegetables. Regular canned tomato sauce and paste. Regular tomato and vegetable juices. Fruits Dried  fruits. Canned fruit in light or heavy syrup. Fruit juice. Meat and Other Protein Products Fatty cuts of meat. Ribs, chicken wings, bacon, sausage, bologna, salami, chitterlings, fatback, hot dogs, bratwurst, and packaged luncheon meats. Salted nuts and seeds. Canned beans with salt. Dairy Whole or 2% milk, cream, half-and-half, and cream cheese. Whole-fat or sweetened yogurt. Full-fat cheeses or blue cheese. Nondairy creamers and whipped toppings. Processed cheese, cheese spreads, or cheese curds. Condiments Onion and garlic salt, seasoned salt, table salt, and sea salt. Canned and packaged gravies. Worcestershire sauce. Tartar sauce. Barbecue sauce. Teriyaki sauce. Soy sauce, including reduced sodium. Steak sauce. Fish sauce. Oyster sauce. Cocktail sauce. Horseradish. Ketchup and mustard. Meat flavorings and tenderizers. Bouillon cubes. Hot sauce. Tabasco sauce. Marinades. Taco seasonings. Relishes. Fats and Oils Butter, stick margarine, lard, shortening, ghee, and bacon fat. Coconut, palm kernel, or palm oils. Regular salad dressings. Other Pickles and olives. Salted popcorn and pretzels. The items listed above may not be a complete list of foods and beverages to avoid. Contact your dietitian for more information. WHERE CAN I FIND MORE INFORMATION? National Heart, Lung, and Blood Institute: [employer] Document Released: 10/28/2011 Document Revised: 03/25/2014 Document Reviewed: 09/12/2013 [facility] Patient Information 2015 [place], [place]. This information is not intended to replace advice given to you by your health care provider. Make sure you discuss any questions you have with your health care provider.

## 2014-08-20 LAB — LIPID PANEL
Cholesterol: 207 mg/dL — ABNORMAL HIGH (ref 0–200)
HDL: 34 mg/dL — ABNORMAL LOW (ref >39)
Total CHOL/HDL Ratio: 6.1 Ratio
Triglycerides: 611 mg/dL — ABNORMAL HIGH (ref <150)

## 2014-08-20 LAB — TSH: TSH: 2.53 u[IU]/mL (ref 0.350–4.500)

## 2014-08-20 LAB — CBC WITH DIFFERENTIAL/PLATELET
BASOS PCT: 1 % (ref 0–1)
Basophils Absolute: 0.1 10*3/uL (ref 0.0–0.1)
Eosinophils Absolute: 0.2 10*3/uL (ref 0.0–0.7)
Eosinophils Relative: 2 % (ref 0–5)
HCT: 48 % (ref 39.0–52.0)
HEMOGLOBIN: 17.2 g/dL — AB (ref 13.0–17.0)
LYMPHS ABS: 2.3 10*3/uL (ref 0.7–4.0)
Lymphocytes Relative: 27 % (ref 12–46)
MCH: 33 pg (ref 26.0–34.0)
MCHC: 35.8 g/dL (ref 30.0–36.0)
MCV: 92 fL (ref 78.0–100.0)
MONOS PCT: 8 % (ref 3–12)
Monocytes Absolute: 0.7 10*3/uL (ref 0.1–1.0)
NEUTROS ABS: 5.3 10*3/uL (ref 1.7–7.7)
Neutrophils Relative %: 62 % (ref 43–77)
Platelets: 270 10*3/uL (ref 150–400)
RBC: 5.22 MIL/uL (ref 4.22–5.81)
RDW: 13.6 % (ref 11.5–15.5)
WBC: 8.6 10*3/uL (ref 4.0–10.5)

## 2014-08-20 LAB — BASIC METABOLIC PANEL WITH GFR
BUN: 10 mg/dL (ref 6–23)
CO2: 27 mEq/L (ref 19–32)
Calcium: 10.3 mg/dL (ref 8.4–10.5)
Chloride: 99 mEq/L (ref 96–112)
Creat: 0.93 mg/dL (ref 0.50–1.35)
GFR, Est African American: >89 mL/min
GFR, Est Non African American: >89 mL/min
Glucose, Bld: 68 mg/dL — ABNORMAL LOW (ref 70–99)
Potassium: 4.2 mEq/L (ref 3.5–5.3)
Sodium: 140 mEq/L (ref 135–145)

## 2014-08-20 LAB — HEPATIC FUNCTION PANEL
ALBUMIN: 4.9 g/dL (ref 3.5–5.2)
ALT: 32 U/L (ref 0–53)
AST: 28 U/L (ref 0–37)
Alkaline Phosphatase: 70 U/L (ref 39–117)
BILIRUBIN DIRECT: 0.1 mg/dL (ref 0.0–0.3)
Indirect Bilirubin: 0.9 mg/dL (ref 0.2–1.2)
Total Bilirubin: 1 mg/dL (ref 0.2–1.2)
Total Protein: 7.8 g/dL (ref 6.0–8.3)

## 2014-08-20 LAB — VITAMIN D 25 HYDROXY (VIT D DEFICIENCY, FRACTURES): Vit D, 25-Hydroxy: 62 ng/mL (ref 30–89)

## 2014-08-20 LAB — MAGNESIUM: MAGNESIUM: 2 mg/dL (ref 1.5–2.5)

## 2014-08-30 ENCOUNTER — Telehealth: Payer: Self-pay | Admitting: Physician Assistant

## 2014-08-30 NOTE — Telephone Encounter (Signed)
Patient's wife called, on HIPPA. She states he has been sweating a lot and contributes this to his Citalopram. He has stopped taking this abruptly, she was advised to slowly taper down the celexa, 1/2 tablet daily for 1 week and then 1/2 tablet every other day for 1-2 weeks. If the patient has any problems, call the office for OV.  In addition, she states he has not been peeing as much, no UTI/BPH symptoms, advised to increase fluids and go to the ER if stopped urinating, severe back pain/AB pain, fever, chills and make a follow up appointment for labs if not resolved.

## 2014-11-21 ENCOUNTER — Ambulatory Visit (HOSPITAL_COMMUNITY)
Admission: RE | Admit: 2014-11-21 | Discharge: 2014-11-21 | Disposition: A | Discharge status: Home / Self Care | Payer: 59 | Source: Ambulatory Visit | Attending: Physician Assistant | Admitting: Physician Assistant

## 2014-11-21 ENCOUNTER — Ambulatory Visit (INDEPENDENT_AMBULATORY_CARE_PROVIDER_SITE_OTHER): Payer: 59 | Admitting: Physician Assistant

## 2014-11-21 ENCOUNTER — Encounter: Payer: Self-pay | Admitting: Physician Assistant

## 2014-11-21 VITALS — BP 154/98 | HR 94 | Temp 98.0°F | Resp 18 | Ht 69.0 in | Wt 210.0 lb

## 2014-11-21 DIAGNOSIS — R05 Cough: Secondary | ICD-10-CM | POA: Insufficient documentation

## 2014-11-21 DIAGNOSIS — J209 Acute bronchitis, unspecified: Secondary | ICD-10-CM

## 2014-11-21 DIAGNOSIS — R062 Wheezing: Secondary | ICD-10-CM | POA: Diagnosis not present

## 2014-11-21 DIAGNOSIS — R059 Cough, unspecified: Secondary | ICD-10-CM

## 2014-11-21 DIAGNOSIS — L989 Disorder of the skin and subcutaneous tissue, unspecified: Secondary | ICD-10-CM

## 2014-11-21 MED ORDER — DEXAMETHASONE SODIUM PHOSPHATE 100 MG/10ML IJ SOLN
10.0000 mg | Freq: Once | INTRAMUSCULAR | Status: AC
  Administered 2014-11-21: 10 mg via INTRAMUSCULAR

## 2014-11-21 MED ORDER — ALBUTEROL SULFATE HFA 108 (90 BASE) MCG/ACT IN AERS: 2.0000 | INHALATION_SPRAY | RESPIRATORY_TRACT | Status: DC | PRN

## 2014-11-21 MED ORDER — BENZONATATE 100 MG PO CAPS: 200.0000 mg | ORAL_CAPSULE | Freq: Three times a day (TID) | ORAL | Status: DC | PRN

## 2014-11-21 MED ORDER — DOXYCYCLINE HYCLATE 100 MG PO TABS: 100.0000 mg | ORAL_TABLET | Freq: Two times a day (BID) | ORAL | Status: DC

## 2014-11-21 NOTE — Progress Notes (Signed)
Subjective:    Patient ID: Walter Wilkinson, male    DOB: November 30, 1970, 43 y.o.   MRN: [id number]  Cough This is a new problem. Episode onset: 2 weeks ago. The problem has been unchanged. The problem occurs constantly. The cough is non-productive. Associated symptoms include postnasal drip and wheezing. Pertinent negatives include no chest pain, chills, ear pain, fever, headaches, rhinorrhea, sore throat or shortness of breath. The symptoms are aggravated by lying down. Treatments tried: took Z-Pak and Mucinex. The treatment provided no relief.  Chew Tobacco  GFR= >89 on 08/19/14 Review of Systems  Constitutional: Positive for diaphoresis. Negative for fever, chills and fatigue.  HENT: Positive for congestion, postnasal drip and sinus pressure. Negative for ear discharge, ear pain, rhinorrhea, sore throat and trouble swallowing.   Eyes: Negative.   Respiratory: Positive for cough and wheezing. Negative for chest tightness and shortness of breath.   Cardiovascular: Negative.  Negative for chest pain.  Gastrointestinal: Negative.   Skin: Negative.        Except for small mark on his right medial bicep.  Patient states it is not itchy and has been using hydrocortisone cream.  Does not know how it happened.  Neurological: Negative.  Negative for dizziness, light-headedness and headaches.   Past Medical History  Diagnosis Date  . Dyslipidemia   . HTN (hypertension)    Current Outpatient Prescriptions on File Prior to Visit  Medication Sig Dispense Refill  . ALPRAZolam (XANAX) 0.5 MG tablet Take 1 tablet (0.5 mg total) by mouth 2 (two) times daily as needed for anxiety or sleep. 60 tablet 0  . Cholecalciferol (VITAMIN D-3) 5000 UNITS TABS Take by mouth 2 (two) times daily.    Marland Kitchen. lisinopril (PRINIVIL,ZESTRIL) 20 MG tablet TAKE ONE TABLET BY MOUTH ONCE DAILY FOR BLOOD PRESSURE 90 tablet 1  . meloxicam (MOBIC) 15 MG tablet Take 1 tablet (15 mg total) by mouth daily. 10 tablet 0   No current  facility-administered medications on file prior to visit.   No Known Allergies    BP 154/98 mmHg  Pulse 94  Temp(Src) 98 F (36.7 C) (Temporal)  Resp 18  Ht 5\' 9"  (1.753 m)  Wt 210 lb (95.255 kg)  BMI 31.00 kg/m2  SpO2 98% Wt Readings from Last 3 Encounters:  11/21/14 210 lb (95.255 kg)  08/19/14 211 lb (95.709 kg)  08/07/14 213 lb (96.616 kg)    Objective:   Physical Exam  Constitutional: He is oriented to person, place, and time. He appears well-developed and well-nourished. He has a sickly appearance. No distress.  HENT:  Head: Normocephalic.  Right Ear: Tympanic membrane, external ear and ear canal normal.  Left Ear: Tympanic membrane, external ear and ear canal normal.  Nose: Nose normal. Right sinus exhibits no maxillary sinus tenderness and no frontal sinus tenderness. Left sinus exhibits no maxillary sinus tenderness and no frontal sinus tenderness.  Mouth/Throat: Uvula is midline and mucous membranes are normal. Mucous membranes are not pale and not dry. No trismus in the jaw. No uvula swelling. Posterior oropharyngeal erythema present. No oropharyngeal exudate, posterior oropharyngeal edema or tonsillar abscesses.  Eyes: Conjunctivae, EOM and lids are normal. Pupils are equal, round, and reactive to light. Right eye exhibits no discharge. Left eye exhibits no discharge. No scleral icterus.  Neck: Trachea normal, normal range of motion and phonation normal. Neck supple. No tracheal tenderness present. No tracheal deviation present.  Cardiovascular: Normal rate, regular rhythm, S1 normal, S2 normal, normal heart sounds and normal pulses.  Exam reveals no gallop, no distant heart sounds and no friction rub.   No murmur heard. Pulmonary/Chest: Effort normal. No stridor. No respiratory distress. He has no decreased breath sounds. He has wheezes. He has no rhonchi. He has no rales. He exhibits no tenderness.  Wheezing in all lung fields.  Abdominal: Soft. Bowel sounds are normal.   Lymphadenopathy:  No tenderness or LAD.  Neurological: He is alert and oriented to person, place, and time. Gait normal.  Skin: Skin is warm, dry and intact. Lesion noted. No rash noted. He is not diaphoretic. No erythema. No pallor.     Psychiatric: He has a normal mood and affect. His speech is normal and behavior is normal. Judgment and thought content normal. Cognition and memory are normal.  Vitals reviewed.  Assessment & Plan:  1. Acute bronchitis, unspecified organism -Take Doxycycline as prescribed with food-  Doxycycline (VIBRA-TABS) 100 MG tablet; Take 1 tablet (100 mg total) by mouth 2 (two) times daily. 5 days with food  Dispense: 10 tablet; Refill: 0 -Gave Dexamethasone shot in office without complications- dexamethasone (DECADRON) injection 10 mg; Inject 1 mL (10 mg total) into the muscle once. -Take Tessalon Perles as prescribed for cough- benzonatate (TESSALON PERLES) 100 MG capsule; Take 2 capsules (200 mg total) by mouth 3 (three) times daily as needed for cough (Max: 600mg  per day (6 capsules per day)).  Dispense: 120 capsule; Refill: 0 -Take Albuterol as prescribed for wheezing-  albuterol (VENTOLIN HFA) 108 (90 BASE) MCG/ACT inhaler; Inhale 2 puffs into the lungs every 4 (four) hours as needed for wheezing or shortness of breath.  Dispense: 1 Inhaler; Refill: 1  2. Cough Ordered chest x-ray to R/O pneumonia - DG Chest 2 View; Future  3. Skin Lesion of Right Arm- Medial right bicep -Please continue hydrocortisone cream.  If you notice purulent discharge, then start using Neosporin or Bacitracin OTC.  Discussed medication effects and SE's.  Pt agreed to treatment plan. If you are not feeling better in 10-14 days, then please call the office. Please keep your follow up appt on 02/10/15.  Addendum: Chest x-ray (11/21/14) showed: "IMPRESSION: No infiltrate or effusion is seen. Mild peribronchial thickening may indicate bronchitis."  Continue medications as prescribed at  visit.  Cashawn Yanko, Lise AuerJennifer L, PA-C 10:29 AM [facility]

## 2014-11-21 NOTE — Patient Instructions (Addendum)
-Take Doxycycline as prescribed with food -Take Tessalon Perles as prescribed for cough -Take Albuterol Inaler as prescribed for wheezing.  Gave Dexamethasone shot in office for inflammation.   If you are not feeling better in 10-14 days, then please call the office.    Acute Bronchitis Bronchitis is inflammation of the airways that extend from the windpipe into the lungs (bronchi). The inflammation often causes mucus to develop. This leads to a cough, which is the most common symptom of bronchitis.  In acute bronchitis, the condition usually develops suddenly and goes away over time, usually in a couple weeks. Smoking, allergies, and asthma can make bronchitis worse. Repeated episodes of bronchitis may cause further lung problems.  CAUSES Acute bronchitis is most often caused by the same virus that causes a cold. The virus can spread from person to person (contagious) through coughing, sneezing, and touching contaminated objects. SIGNS AND SYMPTOMS   Cough.   Fever.   Coughing up mucus.   Body aches.   Chest congestion.   Chills.   Shortness of breath.   Sore throat.  DIAGNOSIS  Acute bronchitis is usually diagnosed through a physical exam. Your health care provider will also ask you questions about your medical history. Tests, such as chest X-rays, are sometimes done to rule out other conditions.  TREATMENT  Acute bronchitis usually goes away in a couple weeks. Oftentimes, no medical treatment is necessary. Medicines are sometimes given for relief of fever or cough. Antibiotic medicines are usually not needed but may be prescribed in certain situations. In some cases, an inhaler may be recommended to help reduce shortness of breath and control the cough. A cool mist vaporizer may also be used to help thin bronchial secretions and make it easier to clear the chest.  HOME CARE INSTRUCTIONS  Get plenty of rest.   Drink enough fluids to keep your urine clear or pale  yellow (unless you have a medical condition that requires fluid restriction). Increasing fluids may help thin your respiratory secretions (sputum) and reduce chest congestion, and it will prevent dehydration.   Take medicines only as directed by your health care provider.  If you were prescribed an antibiotic medicine, finish it all even if you start to feel better.  Avoid smoking and secondhand smoke. Exposure to cigarette smoke or irritating chemicals will make bronchitis worse. If you are a smoker, consider using nicotine gum or skin patches to help control withdrawal symptoms. Quitting smoking will help your lungs heal faster.   Reduce the chances of another bout of acute bronchitis by washing your hands frequently, avoiding people with cold symptoms, and trying not to touch your hands to your mouth, nose, or eyes.   Keep all follow-up visits as directed by your health care provider.  SEEK MEDICAL CARE IF: Your symptoms do not improve after 1 week of treatment.  SEEK IMMEDIATE MEDICAL CARE IF:  You develop an increased fever or chills.   You have chest pain.   You have severe shortness of breath.  You have bloody sputum.   You develop dehydration.  You faint or repeatedly feel like you are going to pass out.  You develop repeated vomiting.  You develop a severe headache. MAKE SURE YOU:   Understand these instructions.  Will watch your condition.  Will get help right away if you are not doing well or get worse. Document Released: 12/16/2004 Document Revised: 03/25/2014 Document Reviewed: 05/01/2013 [facility] Patient Information 2015 [employer] [place]. This information is not intended to  replace advice given to you by your health care provider. Make sure you discuss any questions you have with your health care provider.

## 2015-01-29 ENCOUNTER — Other Ambulatory Visit: Payer: Self-pay | Admitting: Orthopaedic Surgery

## 2015-01-29 DIAGNOSIS — M25511 Pain in right shoulder: Secondary | ICD-10-CM

## 2015-01-30 ENCOUNTER — Other Ambulatory Visit: Payer: Self-pay | Admitting: Physician Assistant

## 2015-02-07 ENCOUNTER — Other Ambulatory Visit: Payer: Self-pay

## 2015-02-10 ENCOUNTER — Ambulatory Visit
Admission: RE | Admit: 2015-02-10 | Discharge: 2015-02-10 | Disposition: A | Discharge status: Home / Self Care | Payer: 59 | Source: Ambulatory Visit | Attending: Orthopaedic Surgery | Admitting: Orthopaedic Surgery

## 2015-02-10 ENCOUNTER — Ambulatory Visit: Payer: Self-pay | Admitting: Physician Assistant

## 2015-02-10 DIAGNOSIS — M25511 Pain in right shoulder: Secondary | ICD-10-CM

## 2015-03-11 ENCOUNTER — Ambulatory Visit: Payer: 59 | Attending: Orthopedic Surgery | Admitting: Physical Therapy

## 2015-03-11 DIAGNOSIS — M25611 Stiffness of right shoulder, not elsewhere classified: Secondary | ICD-10-CM | POA: Insufficient documentation

## 2015-03-11 DIAGNOSIS — M25511 Pain in right shoulder: Secondary | ICD-10-CM

## 2015-03-11 NOTE — Therapy (Signed)
[facility] [address] [place], [place], [address] Phone: [phone]   Fax:  [phone]  Physical Therapy Evaluation  Patient Details  Name: Walter Wilkinson MRN: [id number] Date of Birth: [birth year] Referring Provider:  Beverely Low, MD  Encounter Date: 03/11/2015      PT End of Session - 03/11/15 1104    Visit Number 1   Number of Visits 12   Date for PT Re-Evaluation 05/06/15   PT Start Time 0950   PT Stop Time 1039   PT Time Calculation (min) 49 min   Activity Tolerance Patient tolerated treatment well   Behavior During Therapy [facility] for tasks assessed/performed      Past Medical History  Diagnosis Date  . Dyslipidemia   . HTN (hypertension)     Past Surgical History  Procedure Laterality Date  . Vasectomy      There were no vitals filed for this visit.  Visit Diagnosis:  Right shoulder pain  Shoulder stiffness, right      Subjective Assessment - 03/11/15 1055    Subjective Can't use my right arm very long without a high pain-level.  Feel numbness into right forearm.   Limitations Lifting   Patient Stated Goals Get back to work.   Currently in Pain? Yes   Pain Score 8    Pain Location Shoulder   Pain Orientation Right   Pain Descriptors / Indicators Aching;Burning;Constant;Numbness   Pain Onset 1 to 4 weeks ago   Pain Frequency Constant            [facility] PT Assessment - 03/11/15 0001    Assessment   Medical Diagnosis Right shoulder injury.   Onset Date --  02/13/15   Precautions   Precautions --  Gentle right shoulder ROM.   Balance Screen   Has the patient fallen in the past 6 months No   Has the patient had a decrease in activity level because of a fear of falling?  No   Is the patient reluctant to leave their home because of a fear of falling?  No   ROM / Strength   AROM / PROM / Strength PROM;Strength   PROM   Overall PROM Comments Right shoulder pasive-assistive flexion= 88 degrees and  ER= 44 degrees.   Strength   Overall Strength Comments Unable to accuratelt assess strength du to high pain-level.   Palpation   Palpation Tender at right C5-6; UT and right ant/post shoulder region.                   [facility] Adult PT Treatment/Exercise - 03/11/15 0001    Modalities   Modalities Electrical Stimulation   Electrical Stimulation   Electrical Stimulation Location Right shoulder.   Electrical Stimulation Parameters 80-150 Hz x 20 minutes to right shoulder 100% scan.                     PT Long Term Goals - 03/11/15 1112    PT LONG TERM GOAL #1   Title Ind with HEP.   Time 4   Period Weeks   Status New   PT LONG TERM GOAL #2   Title Full AROM of right shoulder   Time 4   Period Weeks   Status New   PT LONG TERM GOAL #3   Title 5/5 right shoulder strength.   Time 4   Period Weeks   Status New   PT LONG TERM GOAL #4   Title  Perform ADL's with pain not > 3/10.   Time 4   Period Weeks   Status New               Plan - 03/11/15 1105    Clinical Impression Statement The patient sustained a right shoulder injury while at work on 02/13/15.  She was pushing a cylinder head fixture which appartently was stuck.  As the patient pushed harder it gave and his right shoulder felt as if it popped out back ward.  The patient felt a sharp pain and then numbness into his right forearm.  His pain worsened as time went on.  He has limited use of his right UE as his pain reaches high levels.(7-8/10).  Per MD consult it is thought he may have also sustained a neck/nerve injury and plans to have an MRI to this region as welll.   Pt will benefit from skilled therapeutic intervention in order to improve on the following deficits Decreased activity tolerance;Pain   PT Frequency 3x / week   PT Duration 4 weeks   PT Treatment/Interventions ADLs/Self Care Home Management;Moist Heat;Therapeutic activities;Patient/family education;Therapeutic exercise;Passive range  of motion;Manual techniques;Ultrasound;Cryotherapy;Electrical Stimulation;Neuromuscular re-education   PT Next Visit Plan Gentle passive-assistive ROM and modalities.         Problem List Patient Active Problem List   Diagnosis Date Noted  . Unspecified vitamin D deficiency 08/19/2014  . Encounter for long-term (current) use of other medications 08/19/2014  . Tobacco chew use 08/19/2014  . ANXIETY STATE, UNSPECIFIED 07/15/2010  . OTHER NONSPECIFIC FINDING EXAMINATION OF URINE 06/10/2009  . DYSLIPIDEMIA 01/21/2009  . ERECTILE DYSFUNCTION 01/21/2009  . ESSENTIAL HYPERTENSION, BENIGN 01/21/2009    Delfin Squillace, [place] MPT 03/11/2015, 11:52 AM  [facility] [address] [place], [place], [address] Phone: [phone]   Fax:  [phone]

## 2015-03-13 ENCOUNTER — Ambulatory Visit: Payer: 59 | Admitting: Physical Therapy

## 2015-03-13 ENCOUNTER — Encounter: Payer: Self-pay | Admitting: Physical Therapy

## 2015-03-13 DIAGNOSIS — M25611 Stiffness of right shoulder, not elsewhere classified: Secondary | ICD-10-CM

## 2015-03-13 DIAGNOSIS — M25511 Pain in right shoulder: Secondary | ICD-10-CM | POA: Diagnosis not present

## 2015-03-13 NOTE — Therapy (Signed)
[facility] [address] [place], [place], [address] Phone: [phone]   Fax:  [phone]  Physical Therapy Treatment  Patient Details  Name: Walter Wilkinson MRN: [id number] Date of Birth: [birth year] Referring Provider:  [name], MD  Encounter Date: 03/13/2015      PT End of Session - 03/13/15 1142    Visit Number 2   Number of Visits 12   Date for PT Re-Evaluation 05/06/15   PT Start Time 1116   PT Stop Time 1158   PT Time Calculation (min) 42 min   Activity Tolerance Patient tolerated treatment well   Behavior During Therapy [facility] for tasks assessed/performed      Past Medical History  Diagnosis Date  . Dyslipidemia   . HTN (hypertension)     Past Surgical History  Procedure Laterality Date  . Vasectomy      There were no vitals filed for this visit.  Visit Diagnosis:  Right shoulder pain  Shoulder stiffness, right      Subjective Assessment - 03/13/15 1122    Subjective felt some temporary relief after last tx   Limitations Lifting   Patient Stated Goals Get back to work.   Currently in Pain? Yes   Pain Score 7    Pain Location Shoulder   Pain Orientation Right   Pain Descriptors / Indicators Aching;Sore   Pain Onset 1 to 4 weeks ago   Pain Frequency Constant   Aggravating Factors  constant and any movement   Pain Relieving Factors rest halps a little            [facility] PT Assessment - 03/13/15 0001    PROM   Overall PROM  Deficits   Overall PROM Comments Right shoulder pasive-assistive flexion= 90 degrees and ER= 45 degrees.                     [facility] Adult PT Treatment/Exercise - 03/13/15 0001    Modalities   Modalities Electrical Stimulation;Moist Heat   Moist Heat Therapy   Number Minutes Moist Heat 15 Minutes   Moist Heat Location Shoulder   Electrical Stimulation   Electrical Stimulation Location Right shoulder.   Electrical Stimulation Parameters 80-150HZ    Electrical Stimulation Goals Pain   Manual Therapy   Manual Therapy Passive ROM   Passive ROM passive assistive ROM low load range for flexion/er                     PT Long Term Goals - 03/11/15 1112    PT LONG TERM GOAL #1   Title Ind with HEP.   Time 4   Period Weeks   Status New   PT LONG TERM GOAL #2   Title Full AROM of right shoulder   Time 4   Period Weeks   Status New   PT LONG TERM GOAL #3   Title 5/5 right shoulder strength.   Time 4   Period Weeks   Status New   PT LONG TERM GOAL #4   Title Perform ADL's with pain not > 3/10.   Time 4   Period Weeks   Status New               Plan - 03/13/15 1144    Clinical Impression Statement patient tolerated tx well within limits of tolerable passive assistive range. patient has reported an hour of decreased pain after last tx. unable to meet any goals today due to ROM  restriction from pain. patint will be seeing MD on 03-28-15 and is currently awaiting to have a MRI of neck per MD before any surgery on shoulder if needed.   Pt will benefit from skilled therapeutic intervention in order to improve on the following deficits Decreased activity tolerance;Pain   PT Frequency 3x / week   PT Duration 4 weeks   PT Treatment/Interventions ADLs/Self Care Home Management;Moist Heat;Therapeutic activities;Patient/family education;Therapeutic exercise;Passive range of motion;Manual techniques;Ultrasound;Cryotherapy;Electrical Stimulation;Neuromuscular re-education   PT Next Visit Plan Gentle passive-assistive ROM and modalities.   Consulted and Agree with Plan of Care Patient        Problem List Patient Active Problem List   Diagnosis Date Noted  . Unspecified vitamin D deficiency 08/19/2014  . Encounter for long-term (current) use of other medications 08/19/2014  . Tobacco chew use 08/19/2014  . ANXIETY STATE, UNSPECIFIED 07/15/2010  . OTHER NONSPECIFIC FINDING EXAMINATION OF URINE 06/10/2009  . DYSLIPIDEMIA  01/21/2009  . ERECTILE DYSFUNCTION 01/21/2009  . ESSENTIAL HYPERTENSION, BENIGN 01/21/2009    DUNFORD, CHRISTINA P, PTA 03/13/2015, 12:01 PM  [facility] [address] [place], [place], [address] Phone: [phone]   Fax:  [phone]

## 2015-03-17 ENCOUNTER — Encounter: Payer: Self-pay | Admitting: Physician Assistant

## 2015-03-17 ENCOUNTER — Ambulatory Visit (INDEPENDENT_AMBULATORY_CARE_PROVIDER_SITE_OTHER): Payer: 59 | Admitting: Physician Assistant

## 2015-03-17 VITALS — BP 138/82 | HR 96 | Temp 97.7°F | Resp 16 | Ht 69.0 in | Wt 209.0 lb

## 2015-03-17 DIAGNOSIS — E785 Hyperlipidemia, unspecified: Secondary | ICD-10-CM

## 2015-03-17 DIAGNOSIS — I10 Essential (primary) hypertension: Secondary | ICD-10-CM

## 2015-03-17 DIAGNOSIS — E559 Vitamin D deficiency, unspecified: Secondary | ICD-10-CM

## 2015-03-17 DIAGNOSIS — Z720 Tobacco use: Secondary | ICD-10-CM

## 2015-03-17 DIAGNOSIS — Z79899 Other long term (current) drug therapy: Secondary | ICD-10-CM

## 2015-03-17 LAB — CBC WITH DIFFERENTIAL/PLATELET
Basophils Absolute: 0.1 10*3/uL (ref 0.0–0.1)
Basophils Relative: 1 % (ref 0–1)
EOS ABS: 0.1 10*3/uL (ref 0.0–0.7)
Eosinophils Relative: 1 % (ref 0–5)
HCT: 49.4 % (ref 39.0–52.0)
Hemoglobin: 17.5 g/dL — ABNORMAL HIGH (ref 13.0–17.0)
Lymphocytes Relative: 17 % (ref 12–46)
Lymphs Abs: 1.8 10*3/uL (ref 0.7–4.0)
MCH: 31.9 pg (ref 26.0–34.0)
MCHC: 35.4 g/dL (ref 30.0–36.0)
MCV: 90 fL (ref 78.0–100.0)
MONOS PCT: 8 % (ref 3–12)
MPV: 10.3 fL (ref 8.6–12.4)
Monocytes Absolute: 0.8 10*3/uL (ref 0.1–1.0)
NEUTROS ABS: 7.5 10*3/uL (ref 1.7–7.7)
NEUTROS PCT: 73 % (ref 43–77)
PLATELETS: 289 10*3/uL (ref 150–400)
RBC: 5.49 MIL/uL (ref 4.22–5.81)
RDW: 13.6 % (ref 11.5–15.5)
WBC: 10.3 10*3/uL (ref 4.0–10.5)

## 2015-03-17 LAB — TSH: TSH: 2.544 u[IU]/mL (ref 0.350–4.500)

## 2015-03-17 LAB — BASIC METABOLIC PANEL WITH GFR
BUN: 10 mg/dL (ref 6–23)
CALCIUM: 10.2 mg/dL (ref 8.4–10.5)
CO2: 28 mEq/L (ref 19–32)
CREATININE: 0.96 mg/dL (ref 0.50–1.35)
Chloride: 100 mEq/L (ref 96–112)
Glucose, Bld: 79 mg/dL (ref 70–99)
POTASSIUM: 4.5 meq/L (ref 3.5–5.3)
Sodium: 140 mEq/L (ref 135–145)

## 2015-03-17 LAB — HEPATIC FUNCTION PANEL
ALK PHOS: 81 U/L (ref 39–117)
ALT: 22 U/L (ref 0–53)
AST: 23 U/L (ref 0–37)
Albumin: 4.7 g/dL (ref 3.5–5.2)
BILIRUBIN TOTAL: 0.9 mg/dL (ref 0.2–1.2)
Bilirubin, Direct: 0.1 mg/dL (ref 0.0–0.3)
Indirect Bilirubin: 0.8 mg/dL (ref 0.2–1.2)
Total Protein: 7.7 g/dL (ref 6.0–8.3)

## 2015-03-17 LAB — LIPID PANEL
CHOL/HDL RATIO: 6 ratio
CHOLESTEROL: 199 mg/dL (ref 0–200)
HDL: 33 mg/dL — ABNORMAL LOW (ref >=40)
Triglycerides: 434 mg/dL — ABNORMAL HIGH (ref <150)

## 2015-03-17 LAB — MAGNESIUM: Magnesium: 2 mg/dL (ref 1.5–2.5)

## 2015-03-17 MED ORDER — FENOFIBRATE 145 MG PO TABS: 145.0000 mg | ORAL_TABLET | Freq: Every day | ORAL | Status: DC

## 2015-03-17 NOTE — Progress Notes (Signed)
Assessment and Plan:  1. Hypertension -Continue medication, monitor blood pressure at home. Continue DASH diet.  Reminder to go to the ER if any CP, SOB, nausea, dizziness, severe HA, changes vision/speech, left arm numbness and tingling and jaw pain.  2. Cholesterol -Continue diet and exercise. Check cholesterol.  Fenofibrate 145mg  Stop soda/stop beer  5. Vitamin D Def - check level and continue medications.   Continue diet and meds as discussed. Further disposition pending results of labs. Over 30 minutes of exam, counseling, chart review, and critical decision making was performed  HPI 44 y.o. male  presents for 3 month follow up on hypertension, cholesterol, and vitamin D deficiency.   His blood pressure has been controlled at home, today their BP is BP: 138/82 mmHg  He does not workout. He denies chest pain, shortness of breath, dizziness.  He is not on cholesterol medication, he is not on fenofibrate He is drinking alcohol and drinks one pepsi in the AM. His cholesterol is not at goal. The cholesterol last visit was:   Lab Results  Component Value Date   CHOL 207* 08/19/2014   HDL 34* 08/19/2014   LDLCALC NOT CALC 08/19/2014   TRIG 611* 08/19/2014   CHOLHDL 6.1 08/19/2014  Last A1C in the office was:  Lab Results  Component Value Date   HGBA1C 5.4 05/08/2014  Patient is on Vitamin D supplement.   Lab Results  Component Value Date   VD25OH 3862 08/19/2014   He works for himself and has had less stress since that time, not on celexa or xanax anymore.   Right shoulder pain, has been doing PT and seeing ortho.   Current Medications:  Current Outpatient Prescriptions on File Prior to Visit  Medication Sig Dispense Refill  . benzonatate (TESSALON PERLES) 100 MG capsule Take 2 capsules (200 mg total) by mouth 3 (three) times daily as needed for cough (Max: 600mg  per day (6 capsules per day)). 120 capsule 0  . Cholecalciferol (VITAMIN D-3) 5000 UNITS TABS Take by mouth 2 (two)  times daily.    Marland Kitchen. lisinopril (PRINIVIL,ZESTRIL) 20 MG tablet TAKE ONE TABLET BY MOUTH ONCE DAILY FOR BLOOD PRESSURE 90 tablet 1  . meloxicam (MOBIC) 15 MG tablet TAKE ONE TABLET BY MOUTH AS NEEDED ONCE DAILY WITH FOOD 90 tablet 0   No current facility-administered medications on file prior to visit.   Medical History:  Past Medical History  Diagnosis Date  . Dyslipidemia   . HTN (hypertension)    Allergies: No Known Allergies   Review of Systems:  Review of Systems  Constitutional: Negative.   HENT: Negative.   Eyes: Negative.   Respiratory: Negative.   Cardiovascular: Negative.   Gastrointestinal: Negative.   Genitourinary: Negative.   Musculoskeletal: Positive for joint pain (shoulder). Negative for myalgias, back pain, falls and neck pain.  Skin: Negative.   Neurological: Negative.   Endo/Heme/Allergies: Negative.   Psychiatric/Behavioral: Negative.     Family history- Review and unchanged Social history- Review and unchanged Physical Exam: BP 138/82 mmHg  Pulse 96  Temp(Src) 97.7 F (36.5 C)  Resp 16  Ht 5\' 9"  (1.753 m)  Wt 209 lb (94.802 kg)  BMI 30.85 kg/m2 Wt Readings from Last 3 Encounters:  03/17/15 209 lb (94.802 kg)  11/21/14 210 lb (95.255 kg)  08/19/14 211 lb (95.709 kg)   General Appearance: Well nourished, in no apparent distress. Eyes: PERRLA, EOMs, conjunctiva no swelling or erythema Sinuses: No Frontal/maxillary tenderness ENT/Mouth: Ext aud canals clear, TMs without  erythema, bulging. No erythema, swelling, or exudate on post pharynx.  Tonsils not swollen or erythematous. Hearing normal.  Neck: Supple, thyroid normal.  Respiratory: Respiratory effort normal, BS equal bilaterally without rales, rhonchi, wheezing or stridor.  Cardio: RRR with no MRGs. Brisk peripheral pulses without edema.  Abdomen: Soft, + BS,  Non tender, no guarding, rebound, hernias, masses. Lymphatics: Non tender without lymphadenopathy.  Musculoskeletal: Full ROM, 5/5  strength, Normal gait. Right shoulder decreased abduction due to pain.  Skin: Warm, dry without rashes, lesions, ecchymosis.  Neuro: Cranial nerves intact. Normal muscle tone, no cerebellar symptoms. Psych: Awake and oriented X 3, normal affect, Insight and Judgment appropriate.    Quentin Mulling, PA-C 4:14 PM [facility]

## 2015-03-17 NOTE — Patient Instructions (Signed)
This is elevated enough to cause acute pancreatitis which can put you in the hospital and kill you. VERY VERY important to be strict with diet.    Food Choices to Lower Your Triglycerides  Triglycerides are a type of fat in your blood. High levels of triglycerides can increase the risk of heart disease and stroke. If your triglyceride levels are high, the foods you eat and your eating habits are very important. Choosing the right foods can help lower your triglycerides.  WHAT GENERAL GUIDELINES DO I NEED TO FOLLOW?  Lose weight if you are overweight.   Limit or avoid alcohol.   Fill one half of your plate with vegetables and green salads.   Limit fruit to two servings a day. Choose fruit instead of juice.   Make one fourth of your plate whole grains. Look for the word "whole" as the first word in the ingredient list.  Fill one fourth of your plate with lean protein foods.  Enjoy fatty fish (such as salmon, mackerel, sardines, and tuna) three times a week.   Choose healthy fats.   Limit foods high in starch and sugar.  Eat more home-cooked food and less restaurant, buffet, and fast food.  Limit fried foods.  Cook foods using methods other than frying.  Limit saturated fats.  Check ingredient lists to avoid foods with partially hydrogenated oils (trans fats) in them. WHAT FOODS CAN I EAT?  Grains Whole grains, such as whole wheat or whole grain breads, crackers, cereals, and pasta. Unsweetened oatmeal, bulgur, barley, quinoa, or brown rice. Corn or whole wheat flour tortillas.  Vegetables Fresh or frozen vegetables (raw, steamed, roasted, or grilled). Green salads. Fruits All fresh, canned (in natural juice), or frozen fruits. Meat and Other Protein Products Ground beef (85% or leaner), grass-fed beef, or beef trimmed of fat. Skinless chicken or [place]. Ground chicken or [place]. Pork trimmed of fat. All fish and seafood. Eggs. Dried beans, peas, or lentils. Unsalted nuts  or seeds. Unsalted canned or dry beans. Dairy Low-fat dairy products, such as skim or 1% milk, 2% or reduced-fat cheeses, low-fat ricotta or cottage cheese, or plain low-fat yogurt. Fats and Oils Tub margarines without trans fats. Light or reduced-fat mayonnaise and salad dressings. Avocado. Safflower, olive, or canola oils. Natural peanut or almond butter. The items listed above may not be a complete list of recommended foods or beverages. Contact your dietitian for more options. WHAT FOODS ARE NOT RECOMMENDED?  Grains White bread. White pasta. White rice. Cornbread. Bagels, pastries, and croissants. Crackers that contain trans fat. Vegetables White potatoes. Corn. Creamed or fried vegetables. Vegetables in a cheese sauce. Fruits Dried fruits. Canned fruit in light or heavy syrup. Fruit juice. Meat and Other Protein Products Fatty cuts of meat. Ribs, chicken wings, bacon, sausage, bologna, salami, chitterlings, fatback, hot dogs, bratwurst, and packaged luncheon meats. Dairy Whole or 2% milk, cream, half-and-half, and cream cheese. Whole-fat or sweetened yogurt. Full-fat cheeses. Nondairy creamers and whipped toppings. Processed cheese, cheese spreads, or cheese curds. Sweets and Desserts Corn syrup, sugars, honey, and molasses. Candy. Jam and jelly. Syrup. Sweetened cereals. Cookies, pies, cakes, donuts, muffins, and ice cream. Fats and Oils Butter, stick margarine, lard, shortening, ghee, or bacon fat. Coconut, palm kernel, or palm oils. Beverages Alcohol. Sweetened drinks (such as sodas, lemonade, and fruit drinks or punches). The items listed above may not be a complete list of foods and beverages to avoid. Contact your dietitian for more information. Document Released: 08/26/2004 Document Revised: 11/13/2013  Document Reviewed: 09/12/2013 [facility] Patient Information 2015 [employer] [place] [place]. This information is not intended to replace advice given to you by your health care provider. Make  sure you discuss any questions you have with your health care provider.

## 2015-03-18 ENCOUNTER — Encounter: Payer: Self-pay | Admitting: *Deleted

## 2015-03-18 ENCOUNTER — Ambulatory Visit: Payer: 59 | Admitting: *Deleted

## 2015-03-18 DIAGNOSIS — M25511 Pain in right shoulder: Secondary | ICD-10-CM | POA: Diagnosis not present

## 2015-03-18 DIAGNOSIS — M25611 Stiffness of right shoulder, not elsewhere classified: Secondary | ICD-10-CM

## 2015-03-18 LAB — VITAMIN D 25 HYDROXY (VIT D DEFICIENCY, FRACTURES): Vit D, 25-Hydroxy: 36 ng/mL (ref 30–100)

## 2015-03-18 NOTE — Therapy (Signed)
[facility] [address] [place], [place], [address] Phone: [phone]   Fax:  [phone]  Physical Therapy Treatment  Patient Details  Name: Walter Wilkinson MRN: [id number] Date of Birth: 1970/12/27 Referring Provider:  [name], MD  Encounter Date: 03/18/2015      PT End of Session - 03/18/15 1024    Visit Number 3   Number of Visits 12   Date for PT Re-Evaluation 05/06/15   PT Start Time 0945   PT Stop Time 1035   PT Time Calculation (min) 50 min      Past Medical History  Diagnosis Date  . Dyslipidemia   . HTN (hypertension)     Past Surgical History  Procedure Laterality Date  . Vasectomy      There were no vitals filed for this visit.  Visit Diagnosis:  Right shoulder pain  Shoulder stiffness, right      Subjective Assessment - 03/18/15 0953    Subjective felt some temporary relief after last tx. About the same pain today 7/10   Limitations Lifting   Patient Stated Goals Get back to work.   Pain Score 7    Pain Location Shoulder   Pain Orientation Right   Pain Descriptors / Indicators Aching;Sore   Pain Onset 1 to 4 weeks ago   Aggravating Factors  MOVEMENTS   Pain Relieving Factors rest helps                         [facility] Adult PT Treatment/Exercise - 03/18/15 0001    Modalities   Modalities Electrical Stimulation;Moist Heat   Moist Heat Therapy   Number Minutes Moist Heat 15 Minutes   Moist Heat Location Shoulder   Electrical Stimulation   Electrical Stimulation Location Right shoulder.   Electrical Stimulation Parameters 80-150hz  x 15 mins   Electrical Stimulation Goals Pain   Manual Therapy   Manual Therapy Passive ROM   Passive ROM passive assistive ROM low load range for flexion/er/IR all with Pt. hooklying                     PT Long Term Goals - 03/11/15 1112    PT LONG TERM GOAL #1   Title Ind with HEP.   Time 4   Period Weeks   Status New   PT LONG TERM GOAL #2   Title Full AROM of right shoulder   Time 4   Period Weeks   Status New   PT LONG TERM GOAL #3   Title 5/5 right shoulder strength.   Time 4   Period Weeks   Status New   PT LONG TERM GOAL #4   Title Perform ADL's with pain not > 3/10.   Time 4   Period Weeks   Status New               Plan - 03/18/15 1025    Clinical Impression Statement Pt did fair with Rx today, but continues to be very Guarded with manual techniques for RT shldr. ER increased to 50 degrees today. Goals are on-going due to pain   Pt will benefit from skilled therapeutic intervention in order to improve on the following deficits Decreased activity tolerance;Pain   PT Frequency 3x / week   PT Duration 4 weeks   PT Treatment/Interventions ADLs/Self Care Home Management;Moist Heat;Therapeutic activities;Patient/family education;Therapeutic exercise;Passive range of motion;Manual techniques;Ultrasound;Cryotherapy;Electrical Stimulation;Neuromuscular re-education   PT Next Visit Plan Gentle passive-assistive  ROM and modalities.        Problem List Patient Active Problem List   Diagnosis Date Noted  . Vitamin D deficiency 08/19/2014  . Medication management 08/19/2014  . Tobacco chew use 08/19/2014  . ANXIETY STATE, UNSPECIFIED 07/15/2010  . OTHER NONSPECIFIC FINDING EXAMINATION OF URINE 06/10/2009  . DYSLIPIDEMIA 01/21/2009  . ERECTILE DYSFUNCTION 01/21/2009  . ESSENTIAL HYPERTENSION, BENIGN 01/21/2009    Briley Bumgarner,CHRIS, PTA 03/18/2015, 11:02 AM  [facility] [address] [place], [place], [address] Phone: [phone]   Fax:  [phone]

## 2015-03-20 ENCOUNTER — Ambulatory Visit: Payer: 59 | Admitting: *Deleted

## 2015-03-20 ENCOUNTER — Encounter: Payer: Self-pay | Admitting: *Deleted

## 2015-03-20 DIAGNOSIS — M25611 Stiffness of right shoulder, not elsewhere classified: Secondary | ICD-10-CM

## 2015-03-20 DIAGNOSIS — M25511 Pain in right shoulder: Secondary | ICD-10-CM | POA: Diagnosis not present

## 2015-03-20 NOTE — Therapy (Signed)
[facility] [address] [place], [place], [address] Phone: [phone]   Fax:  [phone]  Physical Therapy Treatment  Patient Details  Name: Walter Wilkinson MRN: [id number] Date of Birth: Dec 12, 1970 Referring Provider:  Lucky CowboyMcKeown, William, MD  Encounter Date: 03/20/2015      PT End of Session - 03/20/15 1021    Visit Number 4   Number of Visits 12   Date for PT Re-Evaluation 05/06/15   PT Start Time 0941   PT Stop Time 1035   PT Time Calculation (min) 54 min      Past Medical History  Diagnosis Date  . Dyslipidemia   . HTN (hypertension)     Past Surgical History  Procedure Laterality Date  . Vasectomy      There were no vitals filed for this visit.  Visit Diagnosis:  Right shoulder pain  Shoulder stiffness, right      Subjective Assessment - 03/20/15 0951    Subjective felt some temporary relief after last tx. About the same pain today 7/10, sore   Limitations Lifting   Patient Stated Goals Get back to work.   Pain Score 7    Pain Location Shoulder   Pain Orientation Right   Pain Descriptors / Indicators Aching;Sore   Pain Onset 1 to 4 weeks ago   Pain Frequency Constant   Aggravating Factors  movements   Pain Relieving Factors rest            [facility] PT Assessment - 03/20/15 0001    ROM / Strength   AROM / PROM / Strength PROM   PROM   Overall PROM  Deficits   Overall PROM Comments Right shoulder pasive-assistive flexion= 90 degrees and ER= 50 degrees, IR 80 degrees, ABD 100 degrees                     [facility] Adult PT Treatment/Exercise - 03/20/15 0001    Modalities   Modalities Electrical Stimulation;Moist Heat   Moist Heat Therapy   Number Minutes Moist Heat 15 Minutes   Moist Heat Location Shoulder   Electrical Stimulation   Electrical Stimulation Location Right shoulder.   Electrical Stimulation Parameters 80-150hz  x15 min in hooklying   Electrical Stimulation Goals Pain    Manual Therapy   Manual Therapy Passive ROM   Passive ROM passive assistive ROM low load range for flexion/er/IR and ABD all with Pt. hooklying                     PT Long Term Goals - 03/11/15 1112    PT LONG TERM GOAL #1   Title Ind with HEP.   Time 4   Period Weeks   Status New   PT LONG TERM GOAL #2   Title Full AROM of right shoulder   Time 4   Period Weeks   Status New   PT LONG TERM GOAL #3   Title 5/5 right shoulder strength.   Time 4   Period Weeks   Status New   PT LONG TERM GOAL #4   Title Perform ADL's with pain not > 3/10.   Time 4   Period Weeks   Status New               Plan - 03/20/15 1022    Clinical Impression Statement Pt did about the same with flexion and er ROM with pain being the stopping point. He was able to get 100 degrees  of abd. and 80 degrees of IR.   Pt will benefit from skilled therapeutic intervention in order to improve on the following deficits Decreased activity tolerance;Pain   PT Frequency 3x / week   PT Duration 4 weeks   PT Treatment/Interventions ADLs/Self Care Home Management;Moist Heat;Therapeutic activities;Patient/family education;Therapeutic exercise;Passive range of motion;Manual techniques;Ultrasound;Cryotherapy;Electrical Stimulation;Neuromuscular re-education   PT Next Visit Plan Gentle passive-assistive ROM and modalities.   Consulted and Agree with Plan of Care Patient        Problem List Patient Active Problem List   Diagnosis Date Noted  . Vitamin D deficiency 08/19/2014  . Medication management 08/19/2014  . Tobacco chew use 08/19/2014  . ANXIETY STATE, UNSPECIFIED 07/15/2010  . OTHER NONSPECIFIC FINDING EXAMINATION OF URINE 06/10/2009  . DYSLIPIDEMIA 01/21/2009  . ERECTILE DYSFUNCTION 01/21/2009  . ESSENTIAL HYPERTENSION, BENIGN 01/21/2009    Keyaria Lawson,CHRIS, PTA 03/20/2015, 10:43 AM  [facility] [address] [place], [place],  [address] Phone: [phone]   Fax:  [phone]

## 2015-03-25 ENCOUNTER — Encounter: Payer: Self-pay | Admitting: *Deleted

## 2015-03-25 ENCOUNTER — Ambulatory Visit: Payer: 59 | Attending: Orthopedic Surgery | Admitting: *Deleted

## 2015-03-25 DIAGNOSIS — M25611 Stiffness of right shoulder, not elsewhere classified: Secondary | ICD-10-CM | POA: Diagnosis not present

## 2015-03-25 DIAGNOSIS — M25511 Pain in right shoulder: Secondary | ICD-10-CM | POA: Insufficient documentation

## 2015-03-25 NOTE — Therapy (Signed)
[facility] [address] [place], [place], [address] Phone: [phone]   Fax:  [phone]  Physical Therapy Treatment  Patient Details  Name: Walter SicklesHoward L Fleisher MRN: [id number] Date of Birth: 1971/05/06 Referring Provider:  Lucky CowboyMcKeown, William, MD  Encounter Date: 03/25/2015      PT End of Session - 03/25/15 1420    Visit Number 5   Number of Visits 12   Date for PT Re-Evaluation 05/06/15   PT Start Time 1345   PT Stop Time 1434   PT Time Calculation (min) 49 min      Past Medical History  Diagnosis Date  . Dyslipidemia   . HTN (hypertension)     Past Surgical History  Procedure Laterality Date  . Vasectomy      There were no vitals filed for this visit.  Visit Diagnosis:  Right shoulder pain  Shoulder stiffness, right      Subjective Assessment - 03/25/15 1351    Subjective felt some temporary relief after last tx. About the same pain today 7/10 when I raise it forward.   Limitations Lifting   Patient Stated Goals Get back to work.   Currently in Pain? Yes   Pain Score 7   when lifting his arm   Pain Location Shoulder   Pain Orientation Right   Pain Descriptors / Indicators Aching;Sore   Pain Onset More than a month ago   Pain Frequency Intermittent   Aggravating Factors  Raising my arm   Pain Relieving Factors rest            [facility] PT Assessment - 03/25/15 0001    ROM / Strength   AROM / PROM / Strength PROM   PROM   Overall PROM  Deficits   Overall PROM Comments Right shoulder pasive-assistive flexion= 110 degrees and ER= 55 degrees, IR 80 degrees, ABD 160 degrees                     [facility] Adult PT Treatment/Exercise - 03/25/15 0001    Moist Heat Therapy   Number Minutes Moist Heat 15 Minutes   Moist Heat Location Shoulder   Electrical Stimulation   Electrical Stimulation Location Right shoulder.   Electrical Stimulation Parameters 80-150 x15 min with pt hooklying   Manual Therapy   Manual Therapy Passive ROM   Passive ROM passive assistive ROM low load range for flexion/er/IR and ABD all with Pt. hooklying                     PT Long Term Goals - 03/11/15 1112    PT LONG TERM GOAL #1   Title Ind with HEP.   Time 4   Period Weeks   Status New   PT LONG TERM GOAL #2   Title Full AROM of right shoulder   Time 4   Period Weeks   Status New   PT LONG TERM GOAL #3   Title 5/5 right shoulder strength.   Time 4   Period Weeks   Status New   PT LONG TERM GOAL #4   Title Perform ADL's with pain not > 3/10.   Time 4   Period Weeks   Status New               Plan - 03/25/15 1421    Clinical Impression Statement Pt did better today with ROM and had increased ranges in all motions except IR. Pt still gets a "Jabbing" pain  with Flexion and ER at certain angles   Pt will benefit from skilled therapeutic intervention in order to improve on the following deficits Decreased activity tolerance;Pain   PT Frequency 3x / week   PT Duration 4 weeks   PT Treatment/Interventions ADLs/Self Care Home Management;Moist Heat;Therapeutic activities;Patient/family education;Therapeutic exercise;Passive range of motion;Manual techniques;Ultrasound;Cryotherapy;Electrical Stimulation;Neuromuscular re-education   PT Next Visit Plan Gentle passive-assistive ROM and modalities. Send MD note and cont as per MD   Consulted and Agree with Plan of Care Patient        Problem List Patient Active Problem List   Diagnosis Date Noted  . Vitamin D deficiency 08/19/2014  . Medication management 08/19/2014  . Tobacco chew use 08/19/2014  . ANXIETY STATE, UNSPECIFIED 07/15/2010  . OTHER NONSPECIFIC FINDING EXAMINATION OF URINE 06/10/2009  . DYSLIPIDEMIA 01/21/2009  . ERECTILE DYSFUNCTION 01/21/2009  . ESSENTIAL HYPERTENSION, BENIGN 01/21/2009  Gretta Cool, PTA 03/25/2015 2:53 PM  Gaylen Pereira,CHRIS, PTA 03/25/2015, 2:53 PM  [facility] [address] [place], [place], [address] Phone: [phone]   Fax:  [phone]

## 2015-03-27 ENCOUNTER — Encounter: Payer: 59 | Admitting: Physical Therapy

## 2015-04-10 ENCOUNTER — Ambulatory Visit: Payer: 59 | Attending: Orthopedic Surgery | Admitting: Physical Therapy

## 2015-04-10 DIAGNOSIS — M25611 Stiffness of right shoulder, not elsewhere classified: Secondary | ICD-10-CM

## 2015-04-10 DIAGNOSIS — M25511 Pain in right shoulder: Secondary | ICD-10-CM | POA: Diagnosis not present

## 2015-04-10 NOTE — Therapy (Signed)
[facility] [address] [place], [place], [address] Phone: [phone]   Fax:  [phone]  Physical Therapy Treatment  Patient Details  Name: Walter Wilkinson MRN: [id number] Date of Birth: 12/09/1970 Referring Provider:  McKeown, William, MD  Encounter Date: 03/25/2015    Past Medical History  Diagnosis Date  . Dyslipidemia   . HTN (hypertension)     Past Surgical History  Procedure Laterality Date  . Vasectomy      There were no vitals filed for this visit.  Visit Diagnosis:  Right shoulder pain  Shoulder stiffness, right                                    PT Long Term Goals - 03/11/15 1112    PT LONG TERM GOAL #1   Title Ind with HEP.   Time 4   Period Weeks   Status New   PT LONG TERM GOAL #2   Title Full AROM of right shoulder   Time 4   Period Weeks   Status New   PT LONG TERM GOAL #3   Title 5/5 right shoulder strength.   Time 4   Period Weeks   Status New   PT LONG TERM GOAL #4   Title Perform ADL's with pain not > 3/10.   Time 4   Period Weeks   Status New               Problem List Patient Active Problem List   Diagnosis Date Noted  . Vitamin D deficiency 08/19/2014  . Medication management 08/19/2014  . Tobacco chew use 08/19/2014  . ANXIETY STATE, UNSPECIFIED 07/15/2010  . OTHER NONSPECIFIC FINDING EXAMINATION OF URINE 06/10/2009  . DYSLIPIDEMIA 01/21/2009  . ERECTILE DYSFUNCTION 01/21/2009  . ESSENTIAL HYPERTENSION, BENIGN 01/21/2009  PHYSICAL THERAPY DISCHARGE SUMMARY  Visits from Start of Care:   Current functional level related to goals / functional outcomes: No goals met.   Remaining deficits: No goals met.  Education / Equipment: HEP. Plan: Patient agrees to discharge.  Patient goals were not met. Patient is being discharged due to the physician's request.  ?????       APPLEGATE, CHAD MPT 04/10/2015, 9:55 AM  [facility] [address] [place], [place], [address] Phone: [phone]   Fax:  [phone]      

## 2015-04-10 NOTE — Therapy (Signed)
[facility] [address] [place], [place], [address] Phone: [phone]   Fax:  [phone]  Physical Therapy Evaluation  Patient Details  Name: Walter SicklesHoward L Bednarczyk MRN: [id number] Date of Birth: 18-Nov-1971 Referring Provider:  Beverely LowNorris, Steve, MD  Encounter Date: 04/10/2015      PT End of Session - 04/10/15 1152    Visit Number 1   Number of Visits 12   Date for PT Re-Evaluation 05/22/15   PT Start Time 0950   PT Stop Time 1023   PT Time Calculation (min) 33 min      Past Medical History  Diagnosis Date  . Dyslipidemia   . HTN (hypertension)     Past Surgical History  Procedure Laterality Date  . Vasectomy      There were no vitals filed for this visit.  Visit Diagnosis:  Right shoulder pain - Plan: PT plan of care cert/re-cert  Shoulder stiffness, right - Plan: PT plan of care cert/re-cert      Subjective Assessment - 04/10/15 1003    Subjective Surgery 04/07/15.  Been doing pendulum, table and thigh slides and trying to lift shoulder.  Told patient to avoid lifting shoulder at this time.   Limitations Lifting   Patient Stated Goals Get back to work.   Pain Score 10-Worst pain ever   Pain Location Shoulder   Pain Orientation Right   Pain Descriptors / Indicators Aching   Pain Onset More than a month ago   Pain Frequency Constant   Aggravating Factors  Right shoulder movement.   Pain Relieving Factors Having sling on.            [facility] - 04/10/15 0001    Assessment   Medical Diagnosis S/p right rotator cuff surgery.   Precautions   Precautions --  Gentle right shoulder range of motion.   Balance Screen   Has the patient fallen in the past 6 months No   Has the patient had a decrease in activity level because of a fear of falling?  No   Is the patient reluctant to leave their home because of a fear of falling?  No   ROM / Strength   AROM / PROM / Strength PROM   PROM   Overall PROM Comments  Pasive-assisitve right shoulder flexion in supine= 90 degrees and ER= 25 degrees.   Palpation   Palpation --  C/O diffuse rt shoulder pain.  Steri-strips/bandaids intact.                   [facility] Adult PT Treatment/Exercise - 04/10/15 0001    Manual Therapy   Manual therapy comments 1-1 passive-assisitive right shoulder range of motion in supine x 8minutes.                     PT Long Term Goals - 04/10/15 1212    PT LONG TERM GOAL #1   Title Ind with HEP.   Time 4   Period Weeks   Status New   PT LONG TERM GOAL #2   Title Full AROM of right shoulder   Time 4   Period Weeks   Status New   PT LONG TERM GOAL #3   Title 5/5 right shoulder strength.   Time 4   Period Weeks   Status New   PT LONG TERM GOAL #4   Title Perform ADL's with pain not > 3/10.   Time 4   Period Weeks  Status New                Problem List Patient Active Problem List   Diagnosis Date Noted  . Vitamin D deficiency 08/19/2014  . Medication management 08/19/2014  . Tobacco chew use 08/19/2014  . ANXIETY STATE, UNSPECIFIED 07/15/2010  . OTHER NONSPECIFIC FINDING EXAMINATION OF URINE 06/10/2009  . DYSLIPIDEMIA 01/21/2009  . ERECTILE DYSFUNCTION 01/21/2009  . ESSENTIAL HYPERTENSION, BENIGN 01/21/2009    Anessa Charley, Italy[place] MPT 04/10/2015, 12:15 PM  [facility] [address] [place], [place], [address] Phone: [phone]   Fax:  [phone]

## 2015-04-14 ENCOUNTER — Ambulatory Visit: Payer: 59 | Admitting: Physical Therapy

## 2015-04-14 DIAGNOSIS — M25511 Pain in right shoulder: Secondary | ICD-10-CM

## 2015-04-14 DIAGNOSIS — M25611 Stiffness of right shoulder, not elsewhere classified: Secondary | ICD-10-CM

## 2015-04-14 NOTE — Therapy (Signed)
[facility] [address] [place], [place], [address] Phone: [phone]   Fax:  [phone]  Physical Therapy Treatment  Patient Details  Name: Walter SicklesHoward L Dike MRN: [id number] Date of Birth: Apr 10, 1971 Referring Provider:  Lucky CowboyMcKeown, William, MD  Encounter Date: 04/14/2015      PT End of Session - 04/14/15 0904    Visit Number 2   Number of Visits 12   Date for PT Re-Evaluation 05/22/15   PT Start Time 0904   PT Stop Time 0950   PT Time Calculation (min) 46 min   Activity Tolerance Patient tolerated treatment well   Behavior [facility] Therapy [facility] for tasks assessed/performed      Past Medical History  Diagnosis Date  . Dyslipidemia   . HTN (hypertension)     Past Surgical History  Procedure Laterality Date  . Vasectomy      There were no vitals filed for this visit.  Visit Diagnosis:  Right shoulder pain  Shoulder stiffness, right      Subjective Assessment - 04/14/15 0904    Subjective Other than his shoulder he feels fine. States that he can move shoulder into flexion and extension but not into abduction. Did not take pain medications prior to therapy and states that anterior incision is raised over the edges. Reports compliance with pendulums and other exercises.   Limitations Lifting   Patient Stated Goals Get back to work.   Currently in Pain? Yes   Pain Score 8    Pain Location Shoulder   Pain Orientation Right   Pain Descriptors / Indicators Sore   Pain Frequency Constant                         [facility] Adult PT Treatment/Exercise - 04/14/15 0001    Modalities   Modalities Electrical Stimulation;Cryotherapy   Cryotherapy   Number Minutes Cryotherapy 15 Minutes   Cryotherapy Location Shoulder   Type of Cryotherapy Other (comment)  Vasopneumatci   Electrical Stimulation   Electrical Stimulation Location Right shoulder.   Electrical Stimulation Action IFC   Electrical Stimulation Parameters  1-10 Hz   Electrical Stimulation Goals Pain   Manual Therapy   Manual Therapy Passive ROM   Passive ROM PROM/AAROM of R shoulder into flex/ER/IR with gentle holds at end range  Painful arc during flexion and in some areas of ER                     PT Long Term Goals - 04/14/15 0941    PT LONG TERM GOAL #1   Title Ind with HEP.   Time 4   Period Weeks   Status On-going   PT LONG TERM GOAL #2   Title Full AROM of right shoulder   Time 4   Period Weeks   Status On-going   PT LONG TERM GOAL #3   Title 5/5 right shoulder strength.   Time 4   Period Weeks   Status On-going   PT LONG TERM GOAL #4   Title Perform ADL's with pain not > 3/10.   Time 4   Period Weeks   Status On-going               Plan - 04/14/15 0919    Clinical Impression Statement Patient had increased pain during PROM of R shoulder. Steristrips and bandaids had been removed due to patient reports. Demonstrated "ripping" pain during descent of flexion in the painful arc.  Instructed patient to take pain medications prior to therapy to help with pain and not to actively move R shoulder. Normal modalities response noted following removal of modalties. Experienced numbness following treatment.   Pt will benefit from skilled therapeutic intervention in order to improve on the following deficits Decreased activity tolerance;Pain   PT Frequency 3x / week   PT Duration 4 weeks   PT Treatment/Interventions ADLs/Self Care Home Management;Moist Heat;Therapeutic activities;Patient/family education;Therapeutic exercise;Passive range of motion;Manual techniques;Ultrasound;Cryotherapy;Electrical Stimulation;Neuromuscular re-education   PT Next Visit Plan Continue PROM/AAROM of R shoulder and modalites per MPT and MD POC.   Consulted and Agree with Plan of Care Patient        Problem List Patient Active Problem List   Diagnosis Date Noted  . Vitamin D deficiency 08/19/2014  . Medication management  08/19/2014  . Tobacco chew use 08/19/2014  . ANXIETY STATE, UNSPECIFIED 07/15/2010  . OTHER NONSPECIFIC FINDING EXAMINATION OF URINE 06/10/2009  . DYSLIPIDEMIA 01/21/2009  . ERECTILE DYSFUNCTION 01/21/2009  . ESSENTIAL HYPERTENSION, BENIGN 01/21/2009    Florence Canner, PTA 04/14/2015 9:54 AM   [facility] [address] [place], [place], [address] Phone: [phone]   Fax:  [phone]

## 2015-04-17 ENCOUNTER — Encounter: Payer: Self-pay | Admitting: *Deleted

## 2015-04-17 ENCOUNTER — Ambulatory Visit: Payer: 59 | Admitting: *Deleted

## 2015-04-17 ENCOUNTER — Other Ambulatory Visit: Payer: 59

## 2015-04-17 DIAGNOSIS — Z79899 Other long term (current) drug therapy: Secondary | ICD-10-CM

## 2015-04-17 DIAGNOSIS — M25611 Stiffness of right shoulder, not elsewhere classified: Secondary | ICD-10-CM

## 2015-04-17 DIAGNOSIS — M25511 Pain in right shoulder: Secondary | ICD-10-CM

## 2015-04-17 DIAGNOSIS — E785 Hyperlipidemia, unspecified: Secondary | ICD-10-CM

## 2015-04-17 LAB — HEPATIC FUNCTION PANEL
ALK PHOS: 75 U/L (ref 39–117)
ALT: 26 U/L (ref 0–53)
AST: 26 U/L (ref 0–37)
Albumin: 4.4 g/dL (ref 3.5–5.2)
BILIRUBIN TOTAL: 1.1 mg/dL (ref 0.2–1.2)
Bilirubin, Direct: 0.2 mg/dL (ref 0.0–0.3)
Indirect Bilirubin: 0.9 mg/dL (ref 0.2–1.2)
Total Protein: 7.3 g/dL (ref 6.0–8.3)

## 2015-04-17 LAB — LIPID PANEL
CHOL/HDL RATIO: 6.7 ratio
CHOLESTEROL: 180 mg/dL (ref 0–200)
HDL: 27 mg/dL — ABNORMAL LOW (ref >=40)
LDL Cholesterol: 75 mg/dL (ref 0–99)
Triglycerides: 391 mg/dL — ABNORMAL HIGH (ref <150)
VLDL: 78 mg/dL — AB (ref 0–40)

## 2015-04-17 NOTE — Therapy (Signed)
[facility] [address] [place], [place], [address] Phone: [phone]   Fax:  [phone]  Physical Therapy Treatment  Patient Details  Name: Walter Wilkinson MRN: [id number] Date of Birth: June 15, 1971 Referring Provider:  [name], MD  Encounter Date: 04/17/2015      PT End of Session - 04/17/15 1708    Visit Number 3   Number of Visits 12   Date for PT Re-Evaluation 05/22/15   PT Start Time 1601   PT Stop Time 1631   PT Time Calculation (min) 30 min      Past Medical History  Diagnosis Date  . Dyslipidemia   . HTN (hypertension)     Past Surgical History  Procedure Laterality Date  . Vasectomy      There were no vitals filed for this visit.  Visit Diagnosis:  Right shoulder pain  Shoulder stiffness, right      Subjective Assessment - 04/17/15 1613    Subjective RT shldr is very sore.  Went to MD on Tuesday. MD wants just upward movements and no Rotation yet   Limitations Lifting   Patient Stated Goals Get back to work.   Currently in Pain? Yes   Pain Score 7    Pain Location Shoulder   Pain Orientation Right   Pain Onset Today   Pain Frequency Constant   Aggravating Factors  RT shldr movement   Pain Relieving Factors sling and pendulums                         [facility] Adult PT Treatment/Exercise - 04/17/15 0001    Manual Therapy   Manual Therapy Passive ROM   Passive ROM PROM/AAROM of R shoulder into flexion and scaption with gentle holds at end range with Pt hooklying.Scapular Mobs                     PT Long Term Goals - 04/14/15 0941    PT LONG TERM GOAL #1   Title Ind with HEP.   Time 4   Period Weeks   Status On-going   PT LONG TERM GOAL #2   Title Full AROM of right shoulder   Time 4   Period Weeks   Status On-going   PT LONG TERM GOAL #3   Title 5/5 right shoulder strength.   Time 4   Period Weeks   Status On-going   PT LONG TERM GOAL #4   Title  Perform ADL's with pain not > 3/10.   Time 4   Period Weeks   Status On-going               Plan - 04/17/15 1714    Clinical Impression Statement Pt had did fairly well with Rx today, but still has pain with flexion at about 100 degrees. No ER today as per MD. Pt denied ICE and e-stim today due to pt was in a hury   Pt will benefit from skilled therapeutic intervention in order to improve on the following deficits Decreased activity tolerance;Pain   PT Frequency 3x / week   PT Duration 4 weeks   PT Treatment/Interventions ADLs/Self Care Home Management;Moist Heat;Therapeutic activities;Patient/family education;Therapeutic exercise;Passive range of motion;Manual techniques;Ultrasound;Cryotherapy;Electrical Stimulation;Neuromuscular re-education   PT Next Visit Plan Continue PROM/AAROM of R shoulder and modalites per MPT and MD POC. Mainly elevation until MD F/U. NO ER as per MD   Consulted and Agree with Plan of Care Patient  Problem List Patient Active Problem List   Diagnosis Date Noted  . Vitamin D deficiency 08/19/2014  . Medication management 08/19/2014  . Tobacco chew use 08/19/2014  . ANXIETY STATE, UNSPECIFIED 07/15/2010  . OTHER NONSPECIFIC FINDING EXAMINATION OF URINE 06/10/2009  . DYSLIPIDEMIA 01/21/2009  . ERECTILE DYSFUNCTION 01/21/2009  . ESSENTIAL HYPERTENSION, BENIGN 01/21/2009    RAMSEUR,CHRIS, PTA 04/17/2015, 5:29 PM  [facility] [address] [place], [place], [address] Phone: [phone]   Fax:  [phone]

## 2015-04-22 ENCOUNTER — Encounter: Payer: Self-pay | Admitting: Physical Therapy

## 2015-04-22 ENCOUNTER — Ambulatory Visit: Payer: 59 | Admitting: Physical Therapy

## 2015-04-22 DIAGNOSIS — M25611 Stiffness of right shoulder, not elsewhere classified: Secondary | ICD-10-CM

## 2015-04-22 DIAGNOSIS — M25511 Pain in right shoulder: Secondary | ICD-10-CM | POA: Diagnosis not present

## 2015-04-22 NOTE — Therapy (Signed)
[facility] [address] [place], [place], [address] Phone: [phone]   Fax:  [phone]  Physical Therapy Treatment  Patient Details  Name: Walter Wilkinson MRN: [id number] Date of Birth: 06-10-1971 Referring Provider:  [name], MD  Encounter Date: 04/22/2015      PT End of Session - 04/22/15 0929    Visit Number 4   Number of Visits 12   Date for PT Re-Evaluation 05/22/15   PT Start Time 0900   PT Stop Time 0943   PT Time Calculation (min) 43 min   Activity Tolerance Patient tolerated treatment well   Behavior During Therapy [facility] for tasks assessed/performed      Past Medical History  Diagnosis Date  . Dyslipidemia   . HTN (hypertension)     Past Surgical History  Procedure Laterality Date  . Vasectomy      There were no vitals filed for this visit.  Visit Diagnosis:  Right shoulder pain  Shoulder stiffness, right      Subjective Assessment - 04/22/15 0904    Subjective very sore today, have not taken any pain meds. used right lower arm to assist with fishing over weekend   Limitations Lifting   Patient Stated Goals Get back to work.   Currently in Pain? Yes   Pain Score 7    Pain Location Shoulder   Pain Orientation Right   Pain Descriptors / Indicators Sore   Pain Type Surgical pain   Pain Onset 1 to 4 weeks ago   Pain Frequency Constant   Aggravating Factors  movement   Pain Relieving Factors rest                         [facility] Adult PT Treatment/Exercise - 04/22/15 0001    Cryotherapy   Number Minutes Cryotherapy 15 Minutes   Cryotherapy Location Shoulder   Type of Cryotherapy --  vasopnumatic   Electrical Stimulation   Electrical Stimulation Location Right shoulder.   Electrical Stimulation Action IFC   Electrical Stimulation Parameters 1-10HZ    Electrical Stimulation Goals Pain   Manual Therapy   Manual Therapy Passive ROM   Passive ROM PROM/AAROM of R shoulder into  flexion and scaption with gentle holds at end range                      PT Long Term Goals - 04/14/15 0941    PT LONG TERM GOAL #1   Title Ind with HEP.   Time 4   Period Weeks   Status On-going   PT LONG TERM GOAL #2   Title Full AROM of right shoulder   Time 4   Period Weeks   Status On-going   PT LONG TERM GOAL #3   Title 5/5 right shoulder strength.   Time 4   Period Weeks   Status On-going   PT LONG TERM GOAL #4   Title Perform ADL's with pain not > 3/10.   Time 4   Period Weeks   Status On-going               Plan - 04/22/15 0931    Clinical Impression Statement patent tolerated treatment well today with soreness at about 105 degrees of flexion.NO ER per MD. goals ongoing due to limitations and protocol per MD   Pt will benefit from skilled therapeutic intervention in order to improve on the following deficits Decreased activity tolerance;Pain   Clinical  Impairments Affecting Rehab Potential (surgury Apr 07, 2015) 2 weeks current 04-21-15    PT Frequency 3x / week   PT Duration 4 weeks   PT Treatment/Interventions ADLs/Self Care Home Management;Moist Heat;Therapeutic activities;Patient/family education;Therapeutic exercise;Passive range of motion;Manual techniques;Ultrasound;Cryotherapy;Electrical Stimulation;Neuromuscular re-education   PT Next Visit Plan Continue PROM/AAROM of R shoulder and modalites per MPT and MD POC. Mainly elevation until MD F/U. NO ER as per MD   Consulted and Agree with Plan of Care Patient        Problem List Patient Active Problem List   Diagnosis Date Noted  . Vitamin D deficiency 08/19/2014  . Medication management 08/19/2014  . Tobacco chew use 08/19/2014  . ANXIETY STATE, UNSPECIFIED 07/15/2010  . OTHER NONSPECIFIC FINDING EXAMINATION OF URINE 06/10/2009  . DYSLIPIDEMIA 01/21/2009  . ERECTILE DYSFUNCTION 01/21/2009  . ESSENTIAL HYPERTENSION, BENIGN 01/21/2009    Dechelle Attaway P, PTA 04/22/2015, 9:45  AM  [facility] [address] [place], [place], [address] Phone: [phone]   Fax:  [phone]

## 2015-04-23 HISTORY — PX: SHOULDER ARTHROSCOPY W/ ROTATOR CUFF REPAIR: SHX2400

## 2015-04-24 ENCOUNTER — Ambulatory Visit: Payer: 59 | Attending: Orthopedic Surgery | Admitting: *Deleted

## 2015-04-24 ENCOUNTER — Encounter: Payer: Self-pay | Admitting: *Deleted

## 2015-04-24 DIAGNOSIS — M25511 Pain in right shoulder: Secondary | ICD-10-CM | POA: Diagnosis present

## 2015-04-24 DIAGNOSIS — M25611 Stiffness of right shoulder, not elsewhere classified: Secondary | ICD-10-CM | POA: Diagnosis present

## 2015-04-24 NOTE — Therapy (Signed)
[facility] [address] [place], [place], [address] Phone: [phone]   Fax:  [phone]  Physical Therapy Treatment  Patient Details  Name: Crissie SicklesHoward L Perra MRN: [id number] Date of Birth: 26-Jan-1971 Referring Provider:  Lucky CowboyMcKeown, William, MD  Encounter Date: 04/24/2015      PT End of Session - 04/24/15 0943    Visit Number 5   Number of Visits 12   Date for PT Re-Evaluation 05/22/15      Past Medical History  Diagnosis Date  . Dyslipidemia   . HTN (hypertension)     Past Surgical History  Procedure Laterality Date  . Vasectomy      There were no vitals filed for this visit.  Visit Diagnosis:  Right shoulder pain  Shoulder stiffness, right      Subjective Assessment - 04/24/15 0928    Subjective .Still sore, wearing sling. 6/10 pain   Limitations Lifting   Currently in Pain? Yes   Pain Score 6    Pain Location Shoulder   Pain Orientation Right   Pain Descriptors / Indicators Sore   Pain Type Surgical pain   Pain Onset 1 to 4 weeks ago   Pain Frequency Constant   Aggravating Factors  movement   Pain Relieving Factors rest                         [facility] Adult PT Treatment/Exercise - 04/24/15 0001    Modalities   Modalities Electrical Stimulation   Cryotherapy   Number Minutes Cryotherapy 15 Minutes   Cryotherapy Location Shoulder   Type of Cryotherapy --  vasopneumatic   Electrical Stimulation   Electrical Stimulation Location Right shoulder.   Electrical Stimulation Action IFC   Electrical Stimulation Parameters 1-10hz    Electrical Stimulation Goals Pain   Manual Therapy   Manual Therapy Passive ROM                     PT Long Term Goals - 04/14/15 0941    PT LONG TERM GOAL #1   Title Ind with HEP.   Time 4   Period Weeks   Status On-going   PT LONG TERM GOAL #2   Title Full AROM of right shoulder   Time 4   Period Weeks   Status On-going   PT LONG TERM GOAL #3    Title 5/5 right shoulder strength.   Time 4   Period Weeks   Status On-going   PT LONG TERM GOAL #4   Title Perform ADL's with pain not > 3/10.   Time 4   Period Weeks   Status On-going               Plan - 04/24/15 0945    Clinical Impression Statement tolerated treatment well. Complained of soreness during PROM even though it was done slow and gentle. patient protocol calls for no ER.  PROM flexion was 120 today.   Pt will benefit from skilled therapeutic intervention in order to improve on the following deficits Decreased activity tolerance;Pain   Rehab Potential Good   PT Frequency 3x / week   PT Duration 4 weeks   PT Treatment/Interventions ADLs/Self Care Home Management;Cryotherapy;Electrical Stimulation;Moist Heat;Ultrasound;Patient/family education;Therapeutic exercise;Vasopneumatic Device;Passive range of motion   PT Next Visit Plan Continue PROM/AAROM of R shoulder and modalites per MPT and MD POC. Mainly elevation until MD F/U. NO ER as per MD   Consulted and Agree with Plan of  Care Patient        Problem List Patient Active Problem List   Diagnosis Date Noted  . Vitamin D deficiency 08/19/2014  . Medication management 08/19/2014  . Tobacco chew use 08/19/2014  . ANXIETY STATE, UNSPECIFIED 07/15/2010  . OTHER NONSPECIFIC FINDING EXAMINATION OF URINE 06/10/2009  . DYSLIPIDEMIA 01/21/2009  . ERECTILE DYSFUNCTION 01/21/2009  . ESSENTIAL HYPERTENSION, BENIGN 01/21/2009    Cristal Ford P,PTA 04/24/2015, 9:52 AM  [facility] [address] [place], [place], [address] Phone: [phone]   Fax:  [phone]

## 2015-04-29 ENCOUNTER — Encounter: Payer: Self-pay | Admitting: Physical Therapy

## 2015-04-29 ENCOUNTER — Ambulatory Visit: Payer: 59 | Admitting: Physical Therapy

## 2015-04-29 DIAGNOSIS — M25511 Pain in right shoulder: Secondary | ICD-10-CM

## 2015-04-29 DIAGNOSIS — M25611 Stiffness of right shoulder, not elsewhere classified: Secondary | ICD-10-CM

## 2015-04-29 NOTE — Therapy (Signed)
[facility] [address] [place], [place], [address] Phone: [phone]   Fax:  [phone]  Physical Therapy Treatment  Patient Details  Name: Walter Wilkinson MRN: [id number] Date of Birth: 02/22/1971 Referring Provider:  Lucky CowboyMcKeown, William, MD  Encounter Date: 04/29/2015      PT End of Session - 04/29/15 0948    Visit Number 6   Number of Visits 12   Date for PT Re-Evaluation 05/22/15   PT Start Time 0911   PT Stop Time 0951   PT Time Calculation (min) 40 min   Activity Tolerance Patient tolerated treatment well   Behavior During Therapy [facility] for tasks assessed/performed      Past Medical History  Diagnosis Date  . Dyslipidemia   . HTN (hypertension)     Past Surgical History  Procedure Laterality Date  . Vasectomy      There were no vitals filed for this visit.  Visit Diagnosis:  Right shoulder pain  Shoulder stiffness, right      Subjective Assessment - 04/29/15 0937    Subjective Ambulated into therapy without sling donned due to irritation. 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 States that he can move his arm a little but tries to stay above whatever he is working on. Is going at look at a job following therapy that is waist level that he states may use only a screwdriver to complete.   Limitations Lifting   Patient Stated Goals Get back to work.   Currently in Pain? Yes   Pain Score 6    Pain Location Shoulder   Pain Orientation Right            [facility] PT Assessment - 04/29/15 0001    Assessment   Medical Diagnosis S/p right rotator cuff surgery.   Onset Date/Surgical Date 02/13/15   PROM   Overall PROM  Deficits   Overall PROM Comments PROM of R shoulder flexion: 120 deg                     [facility] Adult PT Treatment/Exercise - 04/29/15 0001    Modalities   Modalities Electrical Stimulation;Cryotherapy   Cryotherapy   Number Minutes  Cryotherapy 15 Minutes   Cryotherapy Location Shoulder   Type of Cryotherapy Other (comment)  Med vasopneumatic   Electrical Stimulation   Electrical Stimulation Location Right shoulder.   Electrical Stimulation Action IFC   Electrical Stimulation Parameters 1-10 hz x3115min   Electrical Stimulation Goals Pain   Manual Therapy   Manual Therapy Passive ROM   Passive ROM PROM/AAROM of R shoulder into flexion with gentle holds at end range                      PT Long Term Goals - 04/14/15 0941    PT LONG TERM GOAL #1   Title Ind with HEP.   Time 4   Period Weeks   Status On-going   PT LONG TERM GOAL #2   Title Full AROM of right shoulder   Time 4   Period Weeks   Status On-going   PT LONG TERM GOAL #3   Title 5/5 right shoulder strength.   Time 4   Period Weeks   Status On-going   PT LONG TERM GOAL #4   Title Perform ADL's with pain not > 3/10.   Time 4   Period Weeks  Status On-going               Plan - 04/29/15 1308    Clinical Impression Statement Patient tolerated treatment well today with only pin prick pain at end range in flexion. PROM/AAROM of R shoulder was completed slow and gentle for flexion only per MD. Normal modalties response noted following removal of the modalties. Experienced 4/10 pain following treatment.   Pt will benefit from skilled therapeutic intervention in order to improve on the following deficits Decreased activity tolerance;Pain   Rehab Potential Good   Clinical Impairments Affecting Rehab Potential (surgury Apr 07, 2015) 2 weeks current 04-21-15    PT Frequency 3x / week   PT Duration 4 weeks   PT Treatment/Interventions ADLs/Self Care Home Management;Cryotherapy;Electrical Stimulation;Moist Heat;Ultrasound;Patient/family education;Therapeutic exercise;Vasopneumatic Device;Passive range of motion   PT Next Visit Plan Continue PROM/AAROM of R shoulder and modalites per MPT and MD POC. Mainly elevation until MD F/U. NO ER as  per MD   Consulted and Agree with Plan of Care Patient        Problem List Patient Active Problem List   Diagnosis Date Noted  . Vitamin D deficiency 08/19/2014  . Medication management 08/19/2014  . Tobacco chew use 08/19/2014  . ANXIETY STATE, UNSPECIFIED 07/15/2010  . OTHER NONSPECIFIC FINDING EXAMINATION OF URINE 06/10/2009  . DYSLIPIDEMIA 01/21/2009  . ERECTILE DYSFUNCTION 01/21/2009  . ESSENTIAL HYPERTENSION, BENIGN 01/21/2009    Evelene Croon, PTA 04/29/2015, 9:57 AM  [facility] [address] [place], [place], [address] Phone: [phone]   Fax:  [phone]

## 2015-05-01 ENCOUNTER — Encounter: Payer: Self-pay | Admitting: Physical Therapy

## 2015-05-01 ENCOUNTER — Ambulatory Visit: Payer: 59 | Admitting: Physical Therapy

## 2015-05-01 DIAGNOSIS — M25511 Pain in right shoulder: Secondary | ICD-10-CM | POA: Diagnosis not present

## 2015-05-01 DIAGNOSIS — M25611 Stiffness of right shoulder, not elsewhere classified: Secondary | ICD-10-CM

## 2015-05-01 NOTE — Therapy (Signed)
[facility] [address] [place], [place], [address] Phone: [phone]   Fax:  [phone]  Physical Therapy Treatment  Patient Details  Name: Walter Wilkinson MRN: [id number] Date of Birth: 11-Jun-1971 Referring Provider:  [name], MD  Encounter Date: 05/01/2015      PT End of Session - 05/01/15 0901    Visit Number 7   Number of Visits 12   Date for PT Re-Evaluation 05/22/15   PT Start Time 0900   PT Stop Time 0948   PT Time Calculation (min) 48 min   Activity Tolerance Patient tolerated treatment well   Behavior During Therapy [facility] for tasks assessed/performed      Past Medical History  Diagnosis Date  . Dyslipidemia   . HTN (hypertension)     Past Surgical History  Procedure Laterality Date  . Vasectomy      There were no vitals filed for this visit.  Visit Diagnosis:  Right shoulder pain  Shoulder stiffness, right      Subjective Assessment - 05/01/15 0901    Subjective Reports a little achey feeling which he reports may be how he slept on it. Was riding in truck and caught himself resting his R arm on the side of the window while riding and was sore afterwards.   Limitations Lifting   Patient Stated Goals Get back to work.   Currently in Pain? Yes   Pain Score 6    Pain Location Shoulder   Pain Orientation Right   Pain Descriptors / Indicators Sore;Aching   Pain Onset 1 to 4 weeks ago            [facility] PT Assessment - 05/01/15 0001    Assessment   Medical Diagnosis S/p right rotator cuff surgery.   Onset Date/Surgical Date 02/13/15                     [facility] Adult PT Treatment/Exercise - 05/01/15 0001    Modalities   Modalities Electrical Stimulation   Electrical Stimulation   Electrical Stimulation Location Right shoulder.   Electrical Stimulation Action IFC   Electrical Stimulation Parameters 80-150 Hz x25min   Electrical Stimulation Goals Pain   Manual Therapy   Manual  Therapy Passive ROM   Passive ROM PROM/AAROM of R shoulder into flexion with gentle holds at end range                      PT Long Term Goals - 04/14/15 0941    PT LONG TERM GOAL #1   Title Ind with HEP.   Time 4   Period Weeks   Status On-going   PT LONG TERM GOAL #2   Title Full AROM of right shoulder   Time 4   Period Weeks   Status On-going   PT LONG TERM GOAL #3   Title 5/5 right shoulder strength.   Time 4   Period Weeks   Status On-going   PT LONG TERM GOAL #4   Title Perform ADL's with pain not > 3/10.   Time 4   Period Weeks   Status On-going               Plan - 05/01/15 0955    Clinical Impression Statement Patient tolerated treatment well today but pain only at end range in fleion. PROM/AAROM of R shoulder was completed slow and gentle for flexion only per Dr. Ranell Patrick. Normal modlaites response noted following removal of  the modalties. Denied pain following treatment.   Pt will benefit from skilled therapeutic intervention in order to improve on the following deficits Decreased activity tolerance;Pain   Rehab Potential Good   Clinical Impairments Affecting Rehab Potential (surgury Apr 07, 2015) 2 weeks current 04-21-15    PT Frequency 3x / week   PT Duration 4 weeks   PT Treatment/Interventions ADLs/Self Care Home Management;Cryotherapy;Electrical Stimulation;Moist Heat;Ultrasound;Patient/family education;Therapeutic exercise;Vasopneumatic Device;Passive range of motion   PT Next Visit Plan Continue PROM/AAROM of R shoulder and modalites per MPT and MD POC. Mainly elevation until MD F/U. NO ER as per MD   Consulted and Agree with Plan of Care Patient        Problem List Patient Active Problem List   Diagnosis Date Noted  . Vitamin D deficiency 08/19/2014  . Medication management 08/19/2014  . Tobacco chew use 08/19/2014  . ANXIETY STATE, UNSPECIFIED 07/15/2010  . OTHER NONSPECIFIC FINDING EXAMINATION OF URINE 06/10/2009  .  DYSLIPIDEMIA 01/21/2009  . ERECTILE DYSFUNCTION 01/21/2009  . ESSENTIAL HYPERTENSION, BENIGN 01/21/2009    Evelene Croon, PTA 05/01/2015, 9:56 AM  [facility] [address] [place], [place], [address] Phone: [phone]   Fax:  [phone]

## 2015-05-06 ENCOUNTER — Ambulatory Visit: Payer: 59 | Admitting: Physical Therapy

## 2015-05-06 ENCOUNTER — Encounter: Payer: Self-pay | Admitting: Physical Therapy

## 2015-05-06 DIAGNOSIS — M25511 Pain in right shoulder: Secondary | ICD-10-CM | POA: Diagnosis not present

## 2015-05-06 DIAGNOSIS — M25611 Stiffness of right shoulder, not elsewhere classified: Secondary | ICD-10-CM

## 2015-05-06 NOTE — Therapy (Signed)
[facility] [address] [place], [place], [address] Phone: [phone]   Fax:  [phone]  Physical Therapy Treatment  Patient Details  Name: Walter Wilkinson MRN: [id number] Date of Birth: [birth year] Referring Provider:  [name], MD  Encounter Date: 05/06/2015      PT End of Session - 05/06/15 0932    Visit Number 8   Number of Visits 12   Date for PT Re-Evaluation 05/22/15   PT Start Time 0900   PT Stop Time 0944   PT Time Calculation (min) 44 min   Activity Tolerance Patient tolerated treatment well   Behavior During Therapy [facility] for tasks assessed/performed      Past Medical History  Diagnosis Date  . Dyslipidemia   . HTN (hypertension)     Past Surgical History  Procedure Laterality Date  . Vasectomy      There were no vitals filed for this visit.  Visit Diagnosis:  Right shoulder pain  Shoulder stiffness, right      Subjective Assessment - 05/06/15 0907    Subjective very sore today. wearing sling hurts arm worse and has not been wearing it at all times, not sleeping with sling on and sleeping on left side in bed.   Limitations Lifting   Patient Stated Goals Get back to work.   Currently in Pain? Yes   Pain Score 7    Pain Location Shoulder   Pain Orientation Right   Pain Descriptors / Indicators Aching;Sore   Pain Type Surgical pain   Pain Onset 1 to 4 weeks ago   Pain Frequency Constant   Aggravating Factors  sling or movement   Pain Relieving Factors rest            [facility] PT Assessment - 05/06/15 0001    PROM   Overall PROM  Deficits   Overall PROM Comments P/AAROM 120 degrees flexion                     [facility] Adult PT Treatment/Exercise - 05/06/15 0001    Cryotherapy   Number Minutes Cryotherapy 15 Minutes   Cryotherapy Location Shoulder   Type of Cryotherapy --  vasopnumatic   Electrical Stimulation   Electrical Stimulation Location Right shoulder.   Electrical  Stimulation Action IFC   Electrical Stimulation Parameters 1-10hz    Electrical Stimulation Goals Pain   Manual Therapy   Manual Therapy Passive ROM   Passive ROM PROM/AAROM of Right shoulder into flexion with gentle holds at end range                      PT Long Term Goals - 04/14/15 0941    PT LONG TERM GOAL #1   Title Ind with HEP.   Time 4   Period Weeks   Status On-going   PT LONG TERM GOAL #2   Title Full AROM of right shoulder   Time 4   Period Weeks   Status On-going   PT LONG TERM GOAL #3   Title 5/5 right shoulder strength.   Time 4   Period Weeks   Status On-going   PT LONG TERM GOAL #4   Title Perform ADL's with pain not > 3/10.   Time 4   Period Weeks   Status On-going               Plan - 05/06/15 0934    Clinical Impression Statement Patient had increased pain in right  shoulder today. Has not been wearing sling due to sharp pain and has been sleeping without sling due to uncomfort. Has been moving arm to work muscle per patient. Advised patient to follow MD orders to allow healing and avoid re-injury. Goals ongoig due to protocol limitations.    Pt will benefit from skilled therapeutic intervention in order to improve on the following deficits Decreased activity tolerance;Pain   Rehab Potential Good   Clinical Impairments Affecting Rehab Potential (surgury Apr 07, 2015) 4 weeks current 05-05-15    PT Frequency 3x / week   PT Duration 4 weeks   PT Treatment/Interventions ADLs/Self Care Home Management;Cryotherapy;Electrical Stimulation;Moist Heat;Ultrasound;Patient/family education;Therapeutic exercise;Vasopneumatic Device;Passive range of motion   PT Next Visit Plan Continue PROM/AAROM of R shoulder and modalites per MPT and MD POC. Mainly elevation until MD F/U. NO ER as per MD        Problem List Patient Active Problem List   Diagnosis Date Noted  . Vitamin D deficiency 08/19/2014  . Medication management 08/19/2014  . Tobacco  chew use 08/19/2014  . ANXIETY STATE, UNSPECIFIED 07/15/2010  . OTHER NONSPECIFIC FINDING EXAMINATION OF URINE 06/10/2009  . DYSLIPIDEMIA 01/21/2009  . ERECTILE DYSFUNCTION 01/21/2009  . ESSENTIAL HYPERTENSION, BENIGN 01/21/2009    Alecxis Baltzell P, PTA 05/06/2015, 9:47 AM  [facility] [address] [place], [place], [address] Phone: [phone]   Fax:  [phone]

## 2015-05-08 ENCOUNTER — Ambulatory Visit: Payer: 59 | Admitting: Physical Therapy

## 2015-05-08 DIAGNOSIS — M25511 Pain in right shoulder: Secondary | ICD-10-CM

## 2015-05-08 DIAGNOSIS — M25611 Stiffness of right shoulder, not elsewhere classified: Secondary | ICD-10-CM

## 2015-05-08 NOTE — Therapy (Signed)
[facility] [address] [place], [place], [address] Phone: [phone]   Fax:  [phone]  Physical Therapy Treatment  Patient Details  Name: Walter Wilkinson MRN: [id number] Date of Birth: 1971/07/12 Referring Provider:  [name], MD  Encounter Date: 05/08/2015      PT End of Session - 05/08/15 1238    Visit Number 9   Number of Visits 12   Date for PT Re-Evaluation 05/22/15   PT Start Time 0900   PT Stop Time 0946   PT Time Calculation (min) 46 min   Activity Tolerance Patient tolerated treatment well   Behavior During Therapy [facility] for tasks assessed/performed      Past Medical History  Diagnosis Date  . Dyslipidemia   . HTN (hypertension)     Past Surgical History  Procedure Laterality Date  . Vasectomy      There were no vitals filed for this visit.  Visit Diagnosis:  Right shoulder pain  Shoulder stiffness, right      Subjective Assessment - 05/08/15 1158    Subjective Slept wrong last night.  Increased pain and still get some numbness in my right fingers.   Limitations Lifting   Patient Stated Goals Get back to work.   Pain Score 8    Pain Location Shoulder   Pain Orientation Right   Pain Descriptors / Indicators Aching;Sore   Pain Type Surgical pain   Pain Onset 1 to 4 weeks ago   Pain Frequency Constant                                      PT Long Term Goals - 04/14/15 0941    PT LONG TERM GOAL #1   Title Ind with HEP.   Time 4   Period Weeks   Status On-going   PT LONG TERM GOAL #2   Title Full AROM of right shoulder   Time 4   Period Weeks   Status On-going   PT LONG TERM GOAL #3   Title 5/5 right shoulder strength.   Time 4   Period Weeks   Status On-going   PT LONG TERM GOAL #4   Title Perform ADL's with pain not > 3/10.   Time 4   Period Weeks   Status On-going      Treatment:  1-1 passive-assistive left shoulder range of motion into right  shoulder flexion and ER x 24 minutes.  Instructed patient in supine passive-assistive flexion with patient grasping right wrist with left hand and also instructed him in supine cane exercise to increase ER.  IFC x 15 minutes to patient's right shoulder.         Problem List Patient Active Problem List   Diagnosis Date Noted  . Vitamin D deficiency 08/19/2014  . Medication management 08/19/2014  . Tobacco chew use 08/19/2014  . ANXIETY STATE, UNSPECIFIED 07/15/2010  . OTHER NONSPECIFIC FINDING EXAMINATION OF URINE 06/10/2009  . DYSLIPIDEMIA 01/21/2009  . ERECTILE DYSFUNCTION 01/21/2009  . ESSENTIAL HYPERTENSION, BENIGN 01/21/2009    Roniya Tetro, [place] MPT 05/08/2015, 12:39 PM  [facility] [address] [place], [place], [address] Phone: [phone]   Fax:  [phone]

## 2015-05-12 ENCOUNTER — Encounter: Payer: Self-pay | Admitting: Physician Assistant

## 2015-05-13 ENCOUNTER — Encounter: Payer: Self-pay | Admitting: Physical Therapy

## 2015-05-13 ENCOUNTER — Ambulatory Visit: Payer: 59 | Admitting: Physical Therapy

## 2015-05-13 DIAGNOSIS — M25611 Stiffness of right shoulder, not elsewhere classified: Secondary | ICD-10-CM

## 2015-05-13 DIAGNOSIS — M25511 Pain in right shoulder: Secondary | ICD-10-CM

## 2015-05-13 NOTE — Therapy (Signed)
[facility] [place], [place], [address] Phone: [phone]   Fax:  [phone]  Physical Therapy Treatment  Patient Details  Name: Walter Wilkinson MRN: [id number] Date of Birth: January 06, 1971 Referring Provider:  Unk Pinto, MD  Encounter Date: 05/13/2015      PT End of Session - 05/13/15 0929    Visit Number 10   Number of Visits 12   Date for PT Re-Evaluation 05/22/15   PT Start Time 0901   PT Stop Time 0944   PT Time Calculation (min) 43 min   Activity Tolerance Patient tolerated treatment well   Behavior During Therapy [facility] for tasks assessed/performed      Past Medical History  Diagnosis Date  . Dyslipidemia   . HTN (hypertension)     Past Surgical History  Procedure Laterality Date  . Vasectomy      There were no vitals filed for this visit.  Visit Diagnosis:  Right shoulder pain  Shoulder stiffness, right      Subjective Assessment - 05/13/15 0903    Subjective Patient continues to complain of numbness all the way down to fingers in right LE/ 5-6/10 pain today in right shoulder. Patient reported lifting a 50# bag of dogfood and using Right UE for different tasks at home.   Limitations Lifting   Patient Stated Goals Get back to work.   Currently in Pain? Yes   Pain Score 6    Pain Location Shoulder   Pain Orientation Right   Pain Descriptors / Indicators Aching;Sore   Pain Type Surgical pain   Pain Onset More than a month ago   Pain Frequency Constant   Aggravating Factors  sling or certain movements   Pain Relieving Factors rest            [facility] PT Assessment - 05/13/15 0001    ROM / Strength   AROM / PROM / Strength PROM   PROM   Overall PROM  Deficits   Overall PROM Comments P/AAROM  120 degrees flexion                     [facility] Adult PT Treatment/Exercise - 05/13/15 0001    Cryotherapy   Number Minutes Cryotherapy 15 Minutes   Cryotherapy Location Shoulder   Type of Cryotherapy --  vasopnumatic   Electrical Stimulation   Electrical Stimulation Location Right shoulder.   Electrical Stimulation Action IFC   Electrical Stimulation Parameters 1-10HZ    Electrical Stimulation Goals Pain   Manual Therapy   Manual Therapy Passive ROM   Passive ROM PROM/AAROM of Right shoulder into flexion / elevation with gentle range                     PT Long Term Goals - 05/13/15 7262    PT LONG TERM GOAL #1   Title Ind with HEP.   Time 4   Period Weeks   Status On-going   PT LONG TERM GOAL #2   Title Full AROM of right shoulder   Time 4   Period Weeks   Status --  P/AAROM 120 degrees   PT LONG TERM GOAL #3   Title 5/5 right shoulder strength.   Time 4   Period Weeks   Status On-going  NT   PT LONG TERM GOAL #4   Title Perform ADL's with pain not > 3/10.   Time 4   Period Weeks   Status On-going  5-6/10 pain /  unable to perform ADL's at this time               Plan - 05/13/15 0930    Clinical Impression Statement Patient continues to have complaints of pain and numbness in Right shoulder. Patient reported picking up a 50# bag of dog food using both arms and also reported sleeping without a sling to relieve pain. Paient has not improved with P/AAROM today. Has not met any further goals today due to protocol limitations per MD. Veverly Fells to allow healing. Patient was educated to follow protocol to avoid injury.   Pt will benefit from skilled therapeutic intervention in order to improve on the following deficits Decreased activity tolerance;Pain   Rehab Potential Good   Clinical Impairments Affecting Rehab Potential (surgery Apr 07, 2015) 5weeks current 05/12/15    PT Frequency 3x / week   PT Duration 4 weeks   PT Treatment/Interventions ADLs/Self Care Home Management;Cryotherapy;Electrical Stimulation;Moist Heat;Ultrasound;Patient/family education;Therapeutic exercise;Vasopneumatic Device;Passive range of motion   PT Next Visit  Plan Continue PROM/AAROM of R shoulder and modalites per MPT and MD POC. Mainly elevation until MD F/U. NO ER as per MD (MD with Dr. Veverly Fells 05/13/15)   Consulted and Agree with Plan of Care Patient        Problem List Patient Active Problem List   Diagnosis Date Noted  . Vitamin D deficiency 08/19/2014  . Medication management 08/19/2014  . Tobacco chew use 08/19/2014  . ANXIETY STATE, UNSPECIFIED 07/15/2010  . OTHER NONSPECIFIC FINDING EXAMINATION OF URINE 06/10/2009  . DYSLIPIDEMIA 01/21/2009  . ERECTILE DYSFUNCTION 01/21/2009  . ESSENTIAL HYPERTENSION, BENIGN 01/21/2009   Ladean Raya, PTA 05/13/2015 9:55 AM  Shanaiya Bene P 05/13/2015, 9:55 AM  [facility] [address] [place], [place], [address] Phone: [phone]   Fax:  [phone]

## 2015-05-15 ENCOUNTER — Encounter: Payer: Self-pay | Admitting: Physical Therapy

## 2015-05-15 ENCOUNTER — Ambulatory Visit: Payer: 59 | Admitting: Physical Therapy

## 2015-05-15 DIAGNOSIS — M25611 Stiffness of right shoulder, not elsewhere classified: Secondary | ICD-10-CM

## 2015-05-15 DIAGNOSIS — M25511 Pain in right shoulder: Secondary | ICD-10-CM | POA: Diagnosis not present

## 2015-05-15 NOTE — Therapy (Signed)
[facility] [address] [place], [place], [address] Phone: [phone]   Fax:  [phone]  Physical Therapy Treatment  Patient Details  Name: Walter Wilkinson MRN: [id number] Date of Birth: 28-Dec-1970 Referring Provider:  [name], MD  Encounter Date: 05/15/2015      PT End of Session - 05/15/15 0923    Visit Number 11   Number of Visits 18   Date for PT Re-Evaluation 06/11/15   PT Start Time 0900   PT Stop Time 0958   PT Time Calculation (min) 58 min   Activity Tolerance Patient tolerated treatment well   Behavior During Therapy [facility] for tasks assessed/performed      Past Medical History  Diagnosis Date  . Dyslipidemia   . HTN (hypertension)     Past Surgical History  Procedure Laterality Date  . Vasectomy      There were no vitals filed for this visit.  Visit Diagnosis:  Right shoulder pain  Shoulder stiffness, right      Subjective Assessment - 05/15/15 0904    Subjective New Order per MD.Norris 2xweek for 4weeks AAROM to full, start gentle IR, gentle strengthening close elbow. 4-5/10 pain in right shoulder today.   Limitations Lifting   Patient Stated Goals Get back to work.   Currently in Pain? Yes   Pain Score 5    Pain Location Shoulder   Pain Orientation Right   Pain Descriptors / Indicators Sore   Pain Type Surgical pain   Pain Onset More than a month ago   Pain Frequency Constant   Aggravating Factors  certain movements   Pain Relieving Factors rest            [facility] PT Assessment - 05/15/15 0001    PROM   Overall PROM  Deficits   Overall PROM Comments P/AAROM  135 degrees flexion                     [facility] Adult PT Treatment/Exercise - 05/15/15 0001    Exercises   Exercises Shoulder   Shoulder Exercises: Supine   Other Supine Exercises Cane for AAROM flexion 2x10   Shoulder Exercises: Standing   Other Standing Exercises UE ranger for elevation and circles 2x10    Other Standing Exercises wall ladder x10   Shoulder Exercises: Pulleys   Flexion --    Electrical Stimulation   Electrical Stimulation Location Right shoulder.   Electrical Stimulation Action IFC   Electrical Stimulation Parameters 1-10HZ    Electrical Stimulation Goals Pain   Manual Therapy   Manual Therapy Passive ROM   Passive ROM PROM/AAROM of Right shoulder into flexion / elevation with gentle range, then rythmic stabilization for flex/ext  and IR/ER with close elbow                     PT Long Term Goals - 05/15/15 0944    PT LONG TERM GOAL #1   Title Ind with HEP.   Time 4   Period Weeks   Status On-going   PT LONG TERM GOAL #2   Title Full AROM of right shoulder   Time 4   Period Weeks   Status On-going  P/AAROM 135 degrees   PT LONG TERM GOAL #3   Title 5/5 right shoulder strength.   Time 4   Period Weeks   Status On-going   PT LONG TERM GOAL #4   Title Perform ADL's with pain not > 3/10.  Time 4   Period Weeks   Status On-going               Plan - 05/15/15 0925    Clinical Impression Statement Patient progressing with all activities, has no more reports of numbness and no more sling per MD. Ranell Patrick. Has improved with Flexion AAROM today. Goals ongoing due to protocaol limitations with ROM and strength.   Pt will benefit from skilled therapeutic intervention in order to improve on the following deficits Decreased activity tolerance;Pain   Rehab Potential Good   Clinical Impairments Affecting Rehab Potential (surgery Apr 07, 2015) 5weeks current 05/12/15    PT Frequency 3x / week   PT Duration 4 weeks   PT Treatment/Interventions ADLs/Self Care Home Management;Cryotherapy;Electrical Stimulation;Moist Heat;Ultrasound;Patient/family education;Therapeutic exercise;Vasopneumatic Device;Passive range of motion   PT Next Visit Plan Cont with POC per new MD order   Consulted and Agree with Plan of Care Patient        Problem  List Patient Active Problem List   Diagnosis Date Noted  . Vitamin D deficiency 08/19/2014  . Medication management 08/19/2014  . Tobacco chew use 08/19/2014  . ANXIETY STATE, UNSPECIFIED 07/15/2010  . OTHER NONSPECIFIC FINDING EXAMINATION OF URINE 06/10/2009  . DYSLIPIDEMIA 01/21/2009  . ERECTILE DYSFUNCTION 01/21/2009  . ESSENTIAL HYPERTENSION, BENIGN 01/21/2009    Cassandra Harbold P, PTA 05/15/2015, 10:01 AM  [facility] [address] [place], [place], [address] Phone: [phone]   Fax:  [phone]

## 2015-05-20 ENCOUNTER — Encounter: Payer: Self-pay | Admitting: Physical Therapy

## 2015-05-20 ENCOUNTER — Ambulatory Visit: Payer: 59 | Admitting: Physical Therapy

## 2015-05-20 DIAGNOSIS — M25611 Stiffness of right shoulder, not elsewhere classified: Secondary | ICD-10-CM

## 2015-05-20 DIAGNOSIS — M25511 Pain in right shoulder: Secondary | ICD-10-CM

## 2015-05-20 NOTE — Therapy (Signed)
[facility] [address] [place], [place], [address] Phone: [phone]   Fax:  [phone]  Physical Therapy Treatment  Patient Details  Name: Walter Wilkinson MRN: [id number] Date of Birth: [birth year] Referring Provider:  [name], MD  Encounter Date: 05/20/2015      PT End of Session - 05/20/15 0905    Visit Number 12   Number of Visits 18   Date for PT Re-Evaluation 06/11/15   PT Start Time 0901   PT Stop Time 0952   PT Time Calculation (min) 51 min   Activity Tolerance Patient tolerated treatment well   Behavior During Therapy [facility] for tasks assessed/performed      Past Medical History  Diagnosis Date  . Dyslipidemia   . HTN (hypertension)     Past Surgical History  Procedure Laterality Date  . Vasectomy      There were no vitals filed for this visit.  Visit Diagnosis:  Right shoulder pain  Shoulder stiffness, right      Subjective Assessment - 05/20/15 0903    Subjective R shoulder feels "like a football." Dr. Ranell Patrick told patient that shoulder was healing normally. States that in activites like putting arm on armrest of couch and then pulls arm to his side he feels something snapping over something and can be as bad as to take his breath.    Limitations Lifting   Patient Stated Goals Get back to work.   Currently in Pain? Yes   Pain Score 5    Pain Location Shoulder   Pain Orientation Right   Pain Descriptors / Indicators Aching;Sore   Pain Type Surgical pain   Pain Onset More than a month ago            [facility] PT Assessment - 05/20/15 0001    Assessment   Medical Diagnosis S/p right rotator cuff surgery.   Onset Date/Surgical Date 02/13/15   Next MD Visit 05/2015                     [facility] Adult PT Treatment/Exercise - 05/20/15 0001    Shoulder Exercises: Supine   Flexion AAROM;Other (comment)  x30 reps   Shoulder Exercises: Pulleys   Flexion 3 minutes   ABduction 3 minutes   in scaption; no abduction   Other Pulley Exercises UE ranger flex/circles x30 reps ea   Other Pulley Exercises Wall slides x2 min RUE   Modalities   Modalities Electrical Stimulation;Cryotherapy   Cryotherapy   Number Minutes Cryotherapy 10 Minutes   Cryotherapy Location Shoulder   Type of Cryotherapy Other (comment)  Vasopneumatic   Electrical Stimulation   Electrical Stimulation Location Right shoulder.   Electrical Stimulation Action IFC   Electrical Stimulation Parameters 1-10 Hz x15 min   Electrical Stimulation Goals Pain   Manual Therapy   Manual Therapy Passive ROM   Passive ROM PROM/AAROM R shoulder into flexion in supine                     PT Long Term Goals - 05/15/15 0944    PT LONG TERM GOAL #1   Title Ind with HEP.   Time 4   Period Weeks   Status On-going   PT LONG TERM GOAL #2   Title Full AROM of right shoulder   Time 4   Period Weeks   Status On-going  P/AAROM 135 degrees   PT LONG TERM GOAL #3   Title 5/5 right shoulder  strength.   Time 4   Period Weeks   Status On-going   PT LONG TERM GOAL #4   Title Perform ADL's with pain not > 3/10.   Time 4   Period Weeks   Status On-going               Plan - 05/20/15 0941    Clinical Impression Statement Patient tolerated treatment well today without complaint of increased pain during exercises or PROM. Firm end feels noted during R shoulder PROM. Normal modalties response noted following removal of the modalties. Denied pain following treatment.   Pt will benefit from skilled therapeutic intervention in order to improve on the following deficits Decreased activity tolerance;Pain   Rehab Potential Good   Clinical Impairments Affecting Rehab Potential (surgery Apr 07, 2015) 5weeks current 05/12/15    PT Frequency 3x / week   PT Duration 4 weeks   PT Treatment/Interventions ADLs/Self Care Home Management;Cryotherapy;Electrical Stimulation;Moist Heat;Ultrasound;Patient/family  education;Therapeutic exercise;Vasopneumatic Device;Passive range of motion   PT Next Visit Plan Cont with POC per new MD order   Consulted and Agree with Plan of Care Patient        Problem List Patient Active Problem List   Diagnosis Date Noted  . Vitamin D deficiency 08/19/2014  . Medication management 08/19/2014  . Tobacco chew use 08/19/2014  . ANXIETY STATE, UNSPECIFIED 07/15/2010  . OTHER NONSPECIFIC FINDING EXAMINATION OF URINE 06/10/2009  . DYSLIPIDEMIA 01/21/2009  . ERECTILE DYSFUNCTION 01/21/2009  . ESSENTIAL HYPERTENSION, BENIGN 01/21/2009    Evelene CroonKelsey M Parsons, PTA 05/20/2015, 9:56 AM  [facility] [address] [place], [place], [address] Phone: [phone]   Fax:  [phone]

## 2015-05-22 ENCOUNTER — Encounter: Payer: Self-pay | Admitting: Physical Therapy

## 2015-05-22 ENCOUNTER — Ambulatory Visit: Payer: 59 | Admitting: Physical Therapy

## 2015-05-22 DIAGNOSIS — M25511 Pain in right shoulder: Secondary | ICD-10-CM

## 2015-05-22 DIAGNOSIS — M25611 Stiffness of right shoulder, not elsewhere classified: Secondary | ICD-10-CM

## 2015-05-22 NOTE — Therapy (Signed)
[facility] [address] [place], [place], [address] Phone: [phone]   Fax:  [phone]  Physical Therapy Treatment  Patient Details  Name: Walter SicklesHoward L Chopra MRN: [id number] Date of Birth: 08-May-1971 Referring Provider:  Lucky CowboyMcKeown, William, MD  Encounter Date: 05/22/2015      PT End of Session - 05/22/15 0907    Visit Number 13   Number of Visits 18   Date for PT Re-Evaluation 06/11/15   PT Start Time 0859   PT Stop Time 0949   PT Time Calculation (min) 50 min   Activity Tolerance Patient tolerated treatment well   Behavior During Therapy [facility] for tasks assessed/performed      Past Medical History  Diagnosis Date  . Dyslipidemia   . HTN (hypertension)     Past Surgical History  Procedure Laterality Date  . Vasectomy      There were no vitals filed for this visit.  Visit Diagnosis:  Right shoulder pain  Shoulder stiffness, right      Subjective Assessment - 05/22/15 0906    Subjective States that his shoulder continues to feel like a football, but that it has become a normal feeling. Worse in the morning and does not take pain meds except in increased pain.   Limitations Lifting   Patient Stated Goals Get back to work.   Pain Score 5    Pain Location Shoulder   Pain Orientation Right   Pain Descriptors / Indicators Aching;Sore   Pain Type Surgical pain   Pain Onset More than a month ago   Pain Frequency Constant            [facility] PT Assessment - 05/22/15 0001    Assessment   Medical Diagnosis S/p right rotator cuff surgery.   Onset Date/Surgical Date 02/13/15   Next MD Visit 05/2015   ROM / Strength   AROM / PROM / Strength PROM   PROM   Overall PROM  Deficits   PROM Assessment Site Shoulder   Right/Left Shoulder Right   Right Shoulder Flexion 144 Degrees                     [facility] Adult PT Treatment/Exercise - 05/22/15 0001    Exercises   Exercises Shoulder   Shoulder Exercises: Supine    Flexion AAROM;Other (comment)  x30 reps   Shoulder Exercises: Pulleys   Flexion Other (comment)  4 min   ABduction 3 minutes  in Scaption   Other Pulley Exercises UE ranger flex/circles x30 reps ea   Other Pulley Exercises Wall slides x2 min RUE   Shoulder Exercises: ROM/Strengthening   Rhythmic Stabilization, Supine Flex/ext x4 reps 30 sec each   Modalities   Modalities Electrical Stimulation;Cryotherapy   Cryotherapy   Number Minutes Cryotherapy 15 Minutes   Cryotherapy Location Shoulder   Type of Cryotherapy Other (comment)  vasopneumatci   Electrical Stimulation   Electrical Stimulation Location Right shoulder.   Electrical Stimulation Action IFC   Electrical Stimulation Parameters 80-150 Hz x15 min   Electrical Stimulation Goals Pain   Manual Therapy   Manual Therapy Passive ROM   Passive ROM PROM/AAROM R shoulder into flexion in supine                PT Education - 05/22/15 0944    Education provided Yes   Education Details HEP- wall slides, wand flexion   Person(s) Educated Patient   Methods Explanation;Handout;Demonstration   Comprehension Verbalized understanding;Returned demonstration  PT Long Term Goals - 05/15/15 0944    PT LONG TERM GOAL #1   Title Ind with HEP.   Time 4   Period Weeks   Status On-going   PT LONG TERM GOAL #2   Title Full AROM of right shoulder   Time 4   Period Weeks   Status On-going  P/AAROM 135 degrees   PT LONG TERM GOAL #3   Title 5/5 right shoulder strength.   Time 4   Period Weeks   Status On-going   PT LONG TERM GOAL #4   Title Perform ADL's with pain not > 3/10.   Time 4   Period Weeks   Status On-going               Plan - 05/22/15 0939    Clinical Impression Statement Patient tolerated treatment well today without complaint of pain during exercises or PROM. Firm end feels noted during PROM of the R shoulder. Continues to experience rubbing sensation during lowering of shoulder.  Accpeted new ROM flexion HEP without questions today in clinic. PROM of R shoulder progressed to 144 deg of flexion today; a gain of 9 deg since last measurement. Normal modaltieis response noted followiing removal of the modalities. Experienced numbness in R shoulder following treatment today but did not report pain.   Pt will benefit from skilled therapeutic intervention in order to improve on the following deficits Decreased activity tolerance;Pain   Rehab Potential Good   Clinical Impairments Affecting Rehab Potential (surgery Apr 07, 2015) 5weeks current 05/12/15    PT Frequency 3x / week   PT Duration 4 weeks   PT Treatment/Interventions ADLs/Self Care Home Management;Cryotherapy;Electrical Stimulation;Moist Heat;Ultrasound;Patient/family education;Therapeutic exercise;Vasopneumatic Device;Passive range of motion   PT Next Visit Plan Cont with POC per new MD order   Consulted and Agree with Plan of Care Patient        Problem List Patient Active Problem List   Diagnosis Date Noted  . Vitamin D deficiency 08/19/2014  . Medication management 08/19/2014  . Tobacco chew use 08/19/2014  . ANXIETY STATE, UNSPECIFIED 07/15/2010  . OTHER NONSPECIFIC FINDING EXAMINATION OF URINE 06/10/2009  . DYSLIPIDEMIA 01/21/2009  . ERECTILE DYSFUNCTION 01/21/2009  . ESSENTIAL HYPERTENSION, BENIGN 01/21/2009    Evelene Croon, PTA 05/22/2015, 9:59 AM  [facility] [address] [place], [place], [address] Phone: [phone]   Fax:  [phone]

## 2015-05-22 NOTE — Patient Instructions (Signed)
ROM: Flexion (Alternate)   Slide right arm up wall, with palm out, by leaning toward wall. Hold __3__ seconds. Repeat __10__ times per set. Do __2-3__ sets per session. Do _2-3___ sessions per day.  http://orth.exer.us/758   Copyright  VHI. All rights reserved.  ROM: Flexion - Wand   Bring wand directly over head, leading with right side. Reach back until stretch is felt. Hold _3___ seconds. Repeat __10__ times per set. Do __2-3__ sets per session. Do __2-3__ sessions per day.  http://orth.exer.us/744   Copyright  VHI. All rights reserved.

## 2015-06-03 ENCOUNTER — Encounter: Payer: Self-pay | Admitting: Physical Therapy

## 2015-06-03 ENCOUNTER — Ambulatory Visit: Payer: 59 | Attending: Orthopedic Surgery | Admitting: Physical Therapy

## 2015-06-03 DIAGNOSIS — M25511 Pain in right shoulder: Secondary | ICD-10-CM | POA: Insufficient documentation

## 2015-06-03 DIAGNOSIS — M25611 Stiffness of right shoulder, not elsewhere classified: Secondary | ICD-10-CM | POA: Diagnosis present

## 2015-06-03 NOTE — Therapy (Signed)
[facility] [address] [place], [place], [address] Phone: [phone]   Fax:  [phone]  Physical Therapy Treatment  Patient Details  Name: Walter SicklesHoward L Chachere MRN: [id number] Date of Birth: 01/01/1971 Referring Provider:  Lucky CowboyMcKeown, William, MD  Encounter Date: 06/03/2015      PT End of Session - 06/03/15 0902    Visit Number 14   Number of Visits 18   Date for PT Re-Evaluation 06/11/15   PT Start Time 0900   PT Stop Time 0951   PT Time Calculation (min) 51 min   Activity Tolerance Patient tolerated treatment well   Behavior During Therapy [facility] for tasks assessed/performed      Past Medical History  Diagnosis Date  . Dyslipidemia   . HTN (hypertension)     Past Surgical History  Procedure Laterality Date  . Vasectomy      There were no vitals filed for this visit.  Visit Diagnosis:  Right shoulder pain  Shoulder stiffness, right      Subjective Assessment - 06/03/15 0901    Subjective States that his shoulder is better but continues to have a little achey when waking. Reports that after moving shoulder around after waking then it is fine. Just reports achiness today but no pain.   Limitations Lifting   Patient Stated Goals Get back to work.   Currently in Pain? No/denies            [facility] PT Assessment - 06/03/15 0001    Assessment   Medical Diagnosis S/p right rotator cuff surgery.   Onset Date/Surgical Date 02/13/15   Next MD Visit 06/11/2015   PROM   Overall PROM  Deficits   PROM Assessment Site Shoulder   Right/Left Shoulder Right   Right Shoulder Flexion 154 Degrees                     [facility] Adult PT Treatment/Exercise - 06/03/15 0001    Shoulder Exercises: Supine   Flexion AAROM;Other (comment)  x30 reps   Shoulder Exercises: Pulleys   Flexion 3 minutes   ABduction 3 minutes  In abduction   Other Pulley Exercises UE ranger flex/circles x30 reps ea   Other Pulley Exercises Wall  slides RUE x3 min   Shoulder Exercises: ROM/Strengthening   Rhythmic Stabilization, Supine Flex/ext x4 reps 30 sec each   Modalities   Modalities Electrical Stimulation;Vasopneumatic   Electrical Stimulation   Electrical Stimulation Location Right shoulder.   Electrical Stimulation Action IFC   Electrical Stimulation Parameters 1-10 Hz x15 min   Electrical Stimulation Goals Pain   Vasopneumatic   Number Minutes Vasopneumatic  15 minutes   Vasopnuematic Location  Shoulder   Vasopneumatic Pressure Medium   Manual Therapy   Manual Therapy Passive ROM;Soft tissue mobilization   Soft tissue mobilization R shoulder scar mobilizations around all incisions    Passive ROM PROM/AAROM R shoulder into flexion in supine                     PT Long Term Goals - 06/03/15 0913    PT LONG TERM GOAL #1   Title Ind with HEP.   Time 4   Period Weeks   Status On-going   PT LONG TERM GOAL #2   Title Full AROM of right shoulder   Time 4   Period Weeks   Status On-going  P/AAROM 154 degrees   PT LONG TERM GOAL #3   Title 5/5 right shoulder  strength.   Time 4   Period Weeks   Status On-going   PT LONG TERM GOAL #4   Title Perform ADL's with pain not > 3/10.   Time 4   Period Weeks   Status Achieved               Plan - 06/03/15 0942    Clinical Impression Statement Patient tolerated treatment well today without complaint of pain during exercises or PROM. Firm end feels noted during PROM of the R shoulder. No issues or pain with lowering of the R shoulder. Good R shoulder stability during rhythmic stabilization exercise in supine. Achieved LT ADLs goal today in clinic. Good scar mobility observed in R shoulder incisions with minimal tightness in the inferior aspect of the longer anteriolateral scar. Normal modalities response noted following removal of the modalties. Denied pain following treatment.   Pt will benefit from skilled therapeutic intervention in order to improve  on the following deficits Decreased activity tolerance;Pain   Rehab Potential Good   Clinical Impairments Affecting Rehab Potential (surgery Apr 07, 2015) 8 weeks current 06/02/2015   PT Frequency 3x / week   PT Duration 4 weeks   PT Treatment/Interventions ADLs/Self Care Home Management;Cryotherapy;Electrical Stimulation;Moist Heat;Ultrasound;Patient/family education;Therapeutic exercise;Vasopneumatic Device;Passive range of motion   PT Next Visit Plan Cont with POC per new MD order   Consulted and Agree with Plan of Care Patient        Problem List Patient Active Problem List   Diagnosis Date Noted  . Vitamin D deficiency 08/19/2014  . Medication management 08/19/2014  . Tobacco chew use 08/19/2014  . ANXIETY STATE, UNSPECIFIED 07/15/2010  . OTHER NONSPECIFIC FINDING EXAMINATION OF URINE 06/10/2009  . DYSLIPIDEMIA 01/21/2009  . ERECTILE DYSFUNCTION 01/21/2009  . ESSENTIAL HYPERTENSION, BENIGN 01/21/2009    Evelene Croon, PTA 06/03/2015, 10:07 AM  [facility] [address] [place], [place], [address] Phone: [phone]   Fax:  [phone]

## 2015-06-05 ENCOUNTER — Encounter: Payer: Self-pay | Admitting: Physical Therapy

## 2015-06-05 ENCOUNTER — Ambulatory Visit: Payer: 59 | Admitting: Physical Therapy

## 2015-06-05 DIAGNOSIS — M25511 Pain in right shoulder: Secondary | ICD-10-CM | POA: Diagnosis not present

## 2015-06-05 DIAGNOSIS — M25611 Stiffness of right shoulder, not elsewhere classified: Secondary | ICD-10-CM

## 2015-06-05 NOTE — Therapy (Signed)
[facility] [address] [place], [place], [address] Phone: [phone]   Fax:  [phone]  Physical Therapy Treatment  Patient Details  Name: Walter Wilkinson MRN: [id number] Date of Birth: Apr 18, 1971 Referring Provider:  [name], MD  Encounter Date: 06/05/2015      PT End of Session - 06/05/15 0845    Visit Number 15   Number of Visits 18   Date for PT Re-Evaluation 06/11/15   PT Start Time 0843   PT Stop Time 0930   PT Time Calculation (min) 47 min   Activity Tolerance Patient tolerated treatment well   Behavior During Therapy [facility] for tasks assessed/performed      Past Medical History  Diagnosis Date  . Dyslipidemia   . HTN (hypertension)     Past Surgical History  Procedure Laterality Date  . Vasectomy      There were no vitals filed for this visit.  Visit Diagnosis:  Right shoulder pain  Shoulder stiffness, right      Subjective Assessment - 06/05/15 0843    Subjective States that his shoulder is stiff this morning following being able to sleep on shoulder now. Also states that he has been doing whatever he wants but not pushing his limits with his shoulder without his shoulder giving him trouble.   Limitations Lifting   Patient Stated Goals Get back to work.   Currently in Pain? No/denies            [facility] PT Assessment - 06/05/15 0001    Assessment   Medical Diagnosis S/p right rotator cuff surgery.   Onset Date/Surgical Date 02/13/15   Next MD Visit 06/11/2015   ROM / Strength   AROM / PROM / Strength PROM   PROM   Overall PROM  Deficits   PROM Assessment Site Shoulder   Right/Left Shoulder Right   Right Shoulder Flexion 162 Degrees                     [facility] Adult PT Treatment/Exercise - 06/05/15 0001    Shoulder Exercises: Supine   Protraction AAROM;Right;Other (comment)  3 x10 reps   Flexion AAROM;Other (comment)  3 x10 reps   Shoulder Exercises: Seated   Other Seated  Exercises ABCs with ball x 1 rep   Shoulder Exercises: Pulleys   Flexion 3 minutes   ABduction 3 minutes;Other (comment)  in Scaption   Other Pulley Exercises UE ranger flex/circles x30 reps ea   Other Pulley Exercises Wall slides RUE x3 min   Shoulder Exercises: ROM/Strengthening   Rhythmic Stabilization, Supine RUE flex/ext 5 x30 sec each   Modalities   Modalities Electrical Stimulation;Vasopneumatic   Electrical Stimulation   Electrical Stimulation Location Right shoulder.   Electrical Stimulation Action IFC   Electrical Stimulation Parameters 1-10 Hz x15 min   Electrical Stimulation Goals Pain   Vasopneumatic   Number Minutes Vasopneumatic  15 minutes   Vasopnuematic Location  Shoulder   Vasopneumatic Pressure Medium   Vasopneumatic Temperature  46   Manual Therapy   Manual Therapy Passive ROM   Passive ROM PROM/AAROM R shoulder into flexion in supine                     PT Long Term Goals - 06/05/15 6962    PT LONG TERM GOAL #1   Title Ind with HEP.   Time 4   Period Weeks   Status On-going   PT LONG TERM GOAL #2  Title Full AROM of right shoulder   Time 4   Period Weeks   Status On-going  P/AAROM 162 degrees 06/05/2015   PT LONG TERM GOAL #3   Title 5/5 right shoulder strength.   Time 4   Period Weeks   Status On-going   PT LONG TERM GOAL #4   Title Perform ADL's with pain not > 3/10.   Time 4   Period Weeks   Status Achieved               Plan - 06/05/15 0916    Clinical Impression Statement Patient continues to progress durinng physical therapy without complaint of pain. Firm end feels noted during PROM of the R shoulder. Demonstrates good R shoulder stability during rhythmic stabilizations into flex/ext in supine. Normal modalties response noted following removal fo the modalties. Denied pain following treatment.   Pt will benefit from skilled therapeutic intervention in order to improve on the following deficits Decreased activity  tolerance;Pain   Rehab Potential Good   Clinical Impairments Affecting Rehab Potential (surgery Apr 07, 2015) 8 weeks current 06/02/2015   PT Frequency 3x / week   PT Duration 4 weeks   PT Treatment/Interventions ADLs/Self Care Home Management;Cryotherapy;Electrical Stimulation;Moist Heat;Ultrasound;Patient/family education;Therapeutic exercise;Vasopneumatic Device;Passive range of motion   PT Next Visit Plan Cont with POC per new MD order   Consulted and Agree with Plan of Care Patient        Problem List Patient Active Problem List   Diagnosis Date Noted  . Vitamin D deficiency 08/19/2014  . Medication management 08/19/2014  . Tobacco chew use 08/19/2014  . ANXIETY STATE, UNSPECIFIED 07/15/2010  . OTHER NONSPECIFIC FINDING EXAMINATION OF URINE 06/10/2009  . DYSLIPIDEMIA 01/21/2009  . ERECTILE DYSFUNCTION 01/21/2009  . ESSENTIAL HYPERTENSION, BENIGN 01/21/2009    Evelene CroonKelsey M Parsons, PTA 06/05/2015, 9:33 AM  [facility] [address] [place], [place], [address] Phone: [phone]   Fax:  [phone]

## 2015-06-10 ENCOUNTER — Encounter: Payer: Self-pay | Admitting: Physical Therapy

## 2015-06-10 ENCOUNTER — Ambulatory Visit: Payer: 59 | Admitting: Physical Therapy

## 2015-06-10 DIAGNOSIS — M25511 Pain in right shoulder: Secondary | ICD-10-CM

## 2015-06-10 DIAGNOSIS — M25611 Stiffness of right shoulder, not elsewhere classified: Secondary | ICD-10-CM

## 2015-06-10 NOTE — Therapy (Signed)
[facility] [address] [place], [place], [address] Phone: [phone]   Fax:  [phone]  Physical Therapy Treatment  Patient Details  Name: Walter SicklesHoward L Wilkinson MRN: [id number] Date of Birth: [birth year] Referring Provider:  Lucky CowboyMcKeown, William, MD  Encounter Date: 06/10/2015      PT End of Session - 06/10/15 0902    Visit Number 16   Number of Visits 18   Date for PT Re-Evaluation 06/11/15   PT Start Time 0900   PT Stop Time 0950   PT Time Calculation (min) 50 min   Activity Tolerance Patient tolerated treatment well   Behavior [facility] Therapy [facility] for tasks assessed/performed      Past Medical History  Diagnosis Date  . Dyslipidemia   . HTN (hypertension)     Past Surgical History  Procedure Laterality Date  . Vasectomy      There were no vitals filed for this visit.  Visit Diagnosis:  Right shoulder pain  Shoulder stiffness, right      Subjective Assessment - 06/10/15 0901    Subjective Reports soreness for 10 minutes following waking but he states that soreness goes away. Sees Dr. Ranell PatrickNorris tomorrow. Continues to report that he has been doing whatever he wants to activity wise with no issues in R shoulder.   Limitations Lifting   Patient Stated Goals Get back to work.   Currently in Pain? No/denies            [facility] Assessment - 06/10/15 0001    Assessment   Medical Diagnosis S/p right rotator cuff surgery.   Onset Date/Surgical Date 02/13/15   Next MD Visit 06/11/2015   ROM / Strength   AROM / PROM / Strength PROM   PROM   Overall PROM  Deficits   PROM Assessment Site Shoulder   Right/Left Shoulder Right   Right Shoulder Flexion 163 Degrees                     [facility] Adult PT Treatment/Exercise - 06/10/15 0001    Shoulder Exercises: Supine   Protraction AAROM;Right;Other (comment)  3x10 reps   Flexion AAROM;Other (comment)  3x10 reps   Shoulder Exercises: Seated   Other Seated Exercises  ABCs with ball x 3 rep   Shoulder Exercises: Pulleys   Flexion 3 minutes   ABduction 3 minutes;Other (comment)  Scaption   Other Pulley Exercises UE ranger flex/circles x30 reps ea   Other Pulley Exercises Wall slides RUE x3 min   Shoulder Exercises: ROM/Strengthening   Rhythmic Stabilization, Supine RUE flex/ext 5 x30 sec each   Modalities   Modalities Electrical Stimulation;Vasopneumatic   Electrical Stimulation   Electrical Stimulation Location Right shoulder.   Electrical Stimulation Action IFC   Electrical Stimulation Parameters 1-10 Hz x15 min   Electrical Stimulation Goals Pain   Vasopneumatic   Number Minutes Vasopneumatic  15 minutes   Vasopnuematic Location  Shoulder   Vasopneumatic Pressure Low   Vasopneumatic Temperature  39   Manual Therapy   Manual Therapy Passive ROM   Passive ROM PROM/AAROM R shoulder into flexion in supine                     PT Long Term Goals - 06/05/15 [id number]    PT LONG TERM GOAL #1   Title Ind with HEP.   Time 4   Period Weeks   Status On-going   PT LONG TERM GOAL #2   Title Full AROM  of right shoulder   Time 4   Period Weeks   Status On-going  P/AAROM 162 degrees 06/05/2015   PT LONG TERM GOAL #3   Title 5/5 right shoulder strength.   Time 4   Period Weeks   Status On-going   PT LONG TERM GOAL #4   Title Perform ADL's with pain not > 3/10.   Time 4   Period Weeks   Status Achieved               Plan - 06/10/15 9604    Clinical Impression Statement Patient continues to do very well in physical therapy without any complaints of pain. Firm end feel noted during PROM of the R shoulder into flexion. Continues to demonstrate good R shoulder stability during rhythmic stabilization into flex/ext in supine. Normal modalties response noted following removal of the modalties. AROM of R shoulder into flexion measured as 163 deg today in supine. Denied pain following treatment.   Pt will benefit from skilled therapeutic  intervention in order to improve on the following deficits Decreased activity tolerance;Pain   Rehab Potential Good   Clinical Impairments Affecting Rehab Potential (surgery Apr 07, 2015) 9 weeks current 06/09/2015   PT Frequency 3x / week   PT Duration 4 weeks   PT Treatment/Interventions ADLs/Self Care Home Management;Cryotherapy;Electrical Stimulation;Moist Heat;Ultrasound;Patient/family education;Therapeutic exercise;Vasopneumatic Device;Passive range of motion   PT Next Visit Plan Cont with POC per new MD order   Consulted and Agree with Plan of Care Patient        Problem List Patient Active Problem List   Diagnosis Date Noted  . Vitamin D deficiency 08/19/2014  . Medication management 08/19/2014  . Tobacco chew use 08/19/2014  . ANXIETY STATE, UNSPECIFIED 07/15/2010  . OTHER NONSPECIFIC FINDING EXAMINATION OF URINE 06/10/2009  . DYSLIPIDEMIA 01/21/2009  . ERECTILE DYSFUNCTION 01/21/2009  . ESSENTIAL HYPERTENSION, BENIGN 01/21/2009    Florence Canner, PTA 06/10/2015 9:56 AM  [facility] [address] [place], [place], [address] Phone: [phone]   Fax:  [phone]

## 2015-06-12 ENCOUNTER — Encounter: Payer: 59 | Admitting: Physical Therapy

## 2015-06-12 NOTE — Therapy (Addendum)
[facility] [place], [place], [address] Phone: [phone]   Fax:  [phone]  Physical Therapy Treatment  Patient Details  Name: Walter Wilkinson MRN: [id number] Date of Birth: [birth year] Referring Provider:  Unk Pinto, MD  Encounter Date: 06/10/2015    Past Medical History  Diagnosis Date  . Dyslipidemia   . HTN (hypertension)     Past Surgical History  Procedure Laterality Date  . Vasectomy      There were no vitals filed for this visit.  Visit Diagnosis:  Right shoulder pain  Shoulder stiffness, right                                    PT Long Term Goals - 06/05/15 5638    PT LONG TERM GOAL #1   Title Ind with HEP.   Time 4   Period Weeks   Status On-going   PT LONG TERM GOAL #2   Title Full AROM of right shoulder   Time 4   Period Weeks   Status On-going  P/AAROM 162 degrees 06/05/2015   PT LONG TERM GOAL #3   Title 5/5 right shoulder strength.   Time 4   Period Weeks   Status On-going   PT LONG TERM GOAL #4   Title Perform ADL's with pain not > 3/10.   Time 4   Period Weeks   Status Achieved               Problem List Patient Active Problem List   Diagnosis Date Noted  . Vitamin D deficiency 08/19/2014  . Medication management 08/19/2014  . Tobacco chew use 08/19/2014  . ANXIETY STATE, UNSPECIFIED 07/15/2010  . OTHER NONSPECIFIC FINDING EXAMINATION OF URINE 06/10/2009  . DYSLIPIDEMIA 01/21/2009  . ERECTILE DYSFUNCTION 01/21/2009  . ESSENTIAL HYPERTENSION, BENIGN 01/21/2009   PHYSICAL THERAPY DISCHARGE SUMMARY  Visits from Start of Care:   Current functional level related to goals / functional outcomes:  Please see above.   Remaining deficits: Please see unmet goals above.   Education / Equipment: HEP. Plan: Patient agrees to discharge.  Patient goals were partially met. Patient is being discharged due to the physician's  request.  ?????       Octa Uplinger, [place] MPT 06/12/2015, 2:40 PM  [facility] [place], [place], [address] Phone: [phone]   Fax:  [phone]

## 2015-08-12 ENCOUNTER — Ambulatory Visit (INDEPENDENT_AMBULATORY_CARE_PROVIDER_SITE_OTHER): Payer: 59 | Admitting: Physician Assistant

## 2015-08-12 ENCOUNTER — Encounter: Payer: Self-pay | Admitting: Physician Assistant

## 2015-08-12 VITALS — BP 132/80 | HR 100 | Temp 97.9°F | Resp 16 | Ht 69.0 in | Wt 216.0 lb

## 2015-08-12 DIAGNOSIS — E291 Testicular hypofunction: Secondary | ICD-10-CM

## 2015-08-12 DIAGNOSIS — E669 Obesity, unspecified: Secondary | ICD-10-CM

## 2015-08-12 DIAGNOSIS — Z79899 Other long term (current) drug therapy: Secondary | ICD-10-CM

## 2015-08-12 DIAGNOSIS — I10 Essential (primary) hypertension: Secondary | ICD-10-CM

## 2015-08-12 DIAGNOSIS — E8881 Metabolic syndrome: Secondary | ICD-10-CM

## 2015-08-12 DIAGNOSIS — F411 Generalized anxiety disorder: Secondary | ICD-10-CM

## 2015-08-12 DIAGNOSIS — Z720 Tobacco use: Secondary | ICD-10-CM

## 2015-08-12 DIAGNOSIS — Z0001 Encounter for general adult medical examination with abnormal findings: Secondary | ICD-10-CM

## 2015-08-12 DIAGNOSIS — E785 Hyperlipidemia, unspecified: Secondary | ICD-10-CM

## 2015-08-12 DIAGNOSIS — F528 Other sexual dysfunction not due to a substance or known physiological condition: Secondary | ICD-10-CM

## 2015-08-12 DIAGNOSIS — E559 Vitamin D deficiency, unspecified: Secondary | ICD-10-CM

## 2015-08-12 DIAGNOSIS — Z0000 Encounter for general adult medical examination without abnormal findings: Secondary | ICD-10-CM

## 2015-08-12 HISTORY — DX: Testicular hypofunction: E29.1

## 2015-08-12 LAB — CBC WITH DIFFERENTIAL/PLATELET
Basophils Absolute: 0.1 10*3/uL (ref 0.0–0.1)
Basophils Relative: 1 % (ref 0–1)
EOS ABS: 0.1 10*3/uL (ref 0.0–0.7)
EOS PCT: 1 % (ref 0–5)
HEMATOCRIT: 48.2 % (ref 39.0–52.0)
Hemoglobin: 17.2 g/dL — ABNORMAL HIGH (ref 13.0–17.0)
LYMPHS ABS: 2.2 10*3/uL (ref 0.7–4.0)
LYMPHS PCT: 28 % (ref 12–46)
MCH: 32.5 pg (ref 26.0–34.0)
MCHC: 35.7 g/dL (ref 30.0–36.0)
MCV: 90.9 fL (ref 78.0–100.0)
MPV: 10.1 fL (ref 8.6–12.4)
Monocytes Absolute: 0.7 10*3/uL (ref 0.1–1.0)
Monocytes Relative: 9 % (ref 3–12)
Neutro Abs: 4.8 10*3/uL (ref 1.7–7.7)
Neutrophils Relative %: 61 % (ref 43–77)
Platelets: 264 10*3/uL (ref 150–400)
RBC: 5.3 MIL/uL (ref 4.22–5.81)
RDW: 13.6 % (ref 11.5–15.5)
WBC: 7.8 10*3/uL (ref 4.0–10.5)

## 2015-08-12 MED ORDER — ALPRAZOLAM 0.5 MG PO TABS: 0.5000 mg | ORAL_TABLET | Freq: Two times a day (BID) | ORAL | Status: AC | PRN

## 2015-08-12 MED ORDER — FENOFIBRATE 160 MG PO TABS: 160.0000 mg | ORAL_TABLET | Freq: Every day | ORAL | Status: DC

## 2015-08-12 NOTE — Progress Notes (Signed)
Complete Physical  Assessment and Plan: 1. Essential hypertension, benign - CBC with Differential/Platelet - BASIC METABOLIC PANEL WITH GFR - Hepatic function panel - TSH - Urinalysis, Routine w reflex microscopic (not at [facility]) - Microalbumin / creatinine urine ratio - EKG 12-Lead  2. Hyperlipidemia -continue medications, check lipids, decrease fatty foods, increase activity.  - Lipid panel  3. Hypogonadism in male Declines treatment, will not check.   4. Insulin resistance - Hemoglobin A1c - Insulin, fasting  5. Vitamin D deficiency - Vit D  25 hydroxy (rtn osteoporosis monitoring)  6. Medication management - Magnesium  7. Tobacco chew use Advised to stop chewing, see dentist.   8. ERECTILE DYSFUNCTION  9. Anxiety state continue medications, stress management techniques discussed, increase water, good sleep hygiene discussed, increase exercise, and increase veggies.   10. Encounter for general adult medical examination with abnormal findings - CBC with Differential/Platelet - BASIC METABOLIC PANEL WITH GFR - Hepatic function panel - TSH - Lipid panel - Hemoglobin A1c - Insulin, fasting - Magnesium - Vit D  25 hydroxy (rtn osteoporosis monitoring) - Urinalysis, Routine w reflex microscopic (not at [facility]) - Microalbumin / creatinine urine ratio - EKG 12-Lead  11. Obesity Obesity with co morbidities- long discussion about weight loss, diet, and exercise   Discussed med's effects and SE's. Screening labs and tests as requested with regular follow-up as recommended.  HPI 44 y.o. male  presents for a complete physical. His blood pressure has been controlled at home, today their BP is BP: 132/80 mmHg He does workout. He denies chest pain, shortness of breath, dizziness.  Still doing his own business, [employer].  He is not on cholesterol medication, but suppose to be on fenofibrate but has been out for about 6 months. His cholesterol is not at goal. The  cholesterol last visit was:   Lab Results  Component Value Date   CHOL 180 04/16/2015   HDL 27* 04/16/2015   LDLCALC 75 04/16/2015   TRIG 391* 04/16/2015   CHOLHDL 6.7 04/16/2015   He has been working on diet and exercise for prediabetes and has decreased drinking, and denies paresthesia of the feet and polydipsia. Last A1C in the office was:  Lab Results  Component Value Date   HGBA1C 5.4 05/08/2014   Last testosterone was low but he is not on treatment.  Lab Results  Component Value Date   TESTOSTERONE 198* 05/08/2014   Patient is on Vitamin D supplement. Lab Results  Component Value Date   VD25OH 36 03/17/2015   BMI is Body mass index is 31.88 kg/(m^2)., he is working on diet and exercise. Wt Readings from Last 3 Encounters:  08/12/15 216 lb (97.977 kg)  03/17/15 209 lb (94.802 kg)  11/21/14 210 lb (95.255 kg)    Current Medications:  Current Outpatient Prescriptions on File Prior to Visit  Medication Sig Dispense Refill  . benzonatate (TESSALON PERLES) 100 MG capsule Take 2 capsules (200 mg total) by mouth 3 (three) times daily as needed for cough (Max:  per day (6 capsules per day)). (Patient not taking: Reported on 04/10/2015) 120 capsule 0  . Cholecalciferol (VITAMIN D-3) 5000 UNITS TABS Take by mouth 2 (two) times daily.    Marland Kitchen lisinopril (PRINIVIL,ZESTRIL) 20 MG tablet TAKE ONE TABLET BY MOUTH ONCE DAILY FOR BLOOD PRESSURE 90 tablet 1  . meloxicam (MOBIC) 15 MG tablet TAKE ONE TABLET BY MOUTH AS NEEDED ONCE DAILY WITH FOOD 90 tablet 0   No current facility-administered medications on file  prior to visit.   Health Maintenance:  Immunization History  Administered Date(s) Administered  . Tdap 10/22/2013   Tetanus: 2014 Pneumovax: N/A Prevnar 13: due 65 Flu vaccine:declines Zostavax:N/A DEXA:N/A Colonoscopy: N/A EGD: N/A  Allergies: No Known Allergies Medical History:  Past Medical History  Diagnosis Date  . Dyslipidemia   . HTN (hypertension)     Surgical History:  Past Surgical History  Procedure Laterality Date  . Vasectomy    . Shoulder arthroscopy w/ rotator cuff repair Right 04/2015   Family History:  Family History  Problem Relation Age of Onset  . Heart attack Father     in his 69s  . Heart disease Father    Social History:   Social History  Substance Use Topics  . Smoking status: Never Smoker   . Smokeless tobacco: Current User    Types: Chew     Comment: no cigarettes; occasionally uses smokeless tobacco  . Alcohol Use: 12.6 oz/week    21 Cans of beer per week   Review of Systems  Constitutional: Negative.   HENT: Negative.   Eyes: Negative.   Respiratory: Negative.   Cardiovascular: Negative.   Gastrointestinal: Negative.   Genitourinary: Positive for frequency. Negative for dysuria, urgency, hematuria and flank pain.  Musculoskeletal: Positive for myalgias (bilateral heel pain, worse in the AM) and back pain. Negative for joint pain, falls and neck pain.  Skin: Negative.   Neurological: Negative.   Psychiatric/Behavioral: Negative.     Physical Exam: Estimated body mass index is 31.88 kg/(m^2) as calculated from the following:   Height as of this encounter:  (1.753 m).   Weight as of this encounter: 216 lb (97.977 kg). BP 132/80 mmHg  Pulse 100  Temp(Src) 97.9 F (36.6 C)  Resp 16  Ht  (1.753 m)  Wt 216 lb (97.977 kg)  BMI 31.88 kg/m2 General Appearance: Well nourished, in no apparent distress. Eyes: PERRLA, EOMs, conjunctiva no swelling or erythema, normal fundi and vessels. Sinuses: No Frontal/maxillary tenderness ENT/Mouth: Ext aud canals clear, normal light reflex with TMs without erythema, bulging. Good dentition. No erythema, swelling, or exudate on post pharynx. Tonsils not swollen or erythematous. Hearing normal.  Neck: Supple, thyroid normal. No bruits Respiratory: Respiratory effort normal, BS equal bilaterally without rales, rhonchi, wheezing or stridor. Cardio: RRR  without murmurs, rubs or gallops. Brisk peripheral pulses without edema.  Chest: symmetric, with normal excursions and percussion. Abdomen: Soft, +BS. Non tender, no guarding, rebound, hernias, masses, or organomegaly. .  Lymphatics: Non tender without lymphadenopathy.  Genitourinary: defer Musculoskeletal: Full ROM all peripheral extremities,5/5 strength, and normal gait. Skin: Warm, dry without rashes, lesions, ecchymosis.  Neuro: Cranial nerves intact, reflexes equal bilaterally. Normal muscle tone, no cerebellar symptoms. Sensation intact.  Psych: Awake and oriented X 3, normal affect, Insight and Judgment appropriate.   EKG: WNL no changes.  Quentin Mulling 2:05 PM

## 2015-08-12 NOTE — Patient Instructions (Addendum)
Plantar Fasciitis Plantar fasciitis is a common condition that causes foot pain. It is soreness (inflammation) of the band of tough fibrous tissue on the bottom of the foot that runs from the heel bone (calcaneus) to the ball of the foot. The cause of this soreness may be from excessive standing, poor fitting shoes, running on hard surfaces, being overweight, having an abnormal walk, or overuse (this is common in runners) of the painful foot or feet. It is also common in aerobic exercise dancers and ballet dancers. SYMPTOMS  Most people with plantar fasciitis complain of:  Severe pain in the morning on the bottom of their foot especially when taking the first steps out of bed. This pain recedes after a few minutes of walking.  Severe pain is experienced also during walking following a long period of inactivity.  Pain is worse when walking barefoot or up stairs DIAGNOSIS   Your caregiver will diagnose this condition by examining and feeling your foot.  Special tests such as X-rays of your foot, are usually not needed. PREVENTION   Consult a sports medicine professional before beginning a new exercise program.  Walking programs offer a good workout. With walking there is a lower chance of overuse injuries common to runners. There is less impact and less jarring of the joints.  Begin all new exercise programs slowly. If problems or pain develop, decrease the amount of time or distance until you are at a comfortable level.  Wear good shoes and replace them regularly.  Stretch your foot and the heel cords at the back of the ankle (Achilles tendon) both before and after exercise.  Run or exercise on even surfaces that are not hard. For example, asphalt is better than pavement.  Do not run barefoot on hard surfaces.  If using a treadmill, vary the incline.  Do not continue to workout if you have foot or joint problems. Seek professional help if they do not improve. HOME CARE INSTRUCTIONS     Avoid activities that cause you pain until you recover.  Use ice or cold packs on the problem or painful areas after working out.  Only take over-the-counter or prescription medicines for pain, discomfort, or fever as directed by your caregiver.  Soft shoe inserts or athletic shoes with air or gel sole cushions may be helpful.  If problems continue or become more severe, consult a sports medicine caregiver or your own health care provider. Cortisone is a potent anti-inflammatory medication that may be injected into the painful area. You can discuss this treatment with your caregiver. MAKE SURE YOU:   Understand these instructions.  Will watch your condition.  Will get help right away if you are not doing well or get worse. Document Released: 08/03/2001 Document Revised: 01/31/2012 Document Reviewed: 10/02/2008 [facility] Patient Information 2015 [place], [place]. This information is not intended to replace advice given to you by your health care provider. Make sure you discuss any questions you have with your health care provider.  9 Ways to Naturally Increase Testosterone Levels  1.   Lose Weight If you're overweight, shedding the excess pounds may increase your testosterone levels, according to research presented at the Endocrine Society's 2012 meeting. Overweight men are more likely to have low testosterone levels to begin with, so this is an important trick to increase your body's testosterone production when you need it most.  2.   High-Intensity Exercise like Peak Fitness  Short intense exercise has a proven positive effect on increasing testosterone levels and  preventing its decline. That's unlike aerobics or prolonged moderate exercise, which have shown to have negative or no effect on testosterone levels. Having a whey protein meal after exercise can further enhance the satiety/testosterone-boosting impact (hunger hormones cause the opposite effect on your testosterone and  libido). Here's a summary of what a typical high-intensity Peak Fitness routine might look like: " Warm up for three minutes  " Exercise as hard and fast as you can for 30 seconds. You should feel like you couldn't possibly go on another few seconds  " Recover at a slow to moderate pace for 90 seconds  " Repeat the high intensity exercise and recovery 7 more times .  3.   Consume Plenty of Zinc The mineral zinc is important for testosterone production, and supplementing your diet for as little as six weeks has been shown to cause a marked improvement in testosterone among men with low levels.1 Likewise, research has shown that restricting dietary sources of zinc leads to a significant decrease in testosterone, while zinc supplementation increases it2 -- and even protects men from exercised-induced reductions in testosterone levels.3 It's estimated that up to 45 percent of adults over the age of 60 may have lower than recommended zinc intakes; even when dietary supplements were added in, an estimated 20-25 percent of older adults still had inadequate zinc intakes, according to a [employer] and Nutrition Examination Survey.4 Your diet is the best source of zinc; along with protein-rich foods like meats and fish, other good dietary sources of zinc include raw milk, raw cheese, beans, and yogurt or kefir made from raw milk. It can be difficult to obtain enough dietary zinc if you're a vegetarian, and also for meat-eaters as well, largely because of conventional farming methods that rely heavily on chemical fertilizers and pesticides. These chemicals deplete the soil of nutrients ... nutrients like zinc that must be absorbed by plants in order to be passed on to you. In many cases, you may further deplete the nutrients in your food by the way you prepare it. For most food, cooking it will drastically reduce its levels of nutrients like zinc . particularly over-cooking, which many people do. If you decide  to use a zinc supplement, stick to a dosage of less than 40 mg a day, as this is the recommended adult upper limit. Taking too much zinc can interfere with your body's ability to absorb other minerals, especially copper, and may cause nausea as a side effect.  4.   Strength Training In addition to Peak Fitness, strength training is also known to boost testosterone levels, provided you are doing so intensely enough. When strength training to boost testosterone, you'll want to increase the weight and lower your number of reps, and then focus on exercises that work a large number of muscles, such as dead lifts or squats.  You can "turbo-charge" your weight training by going slower. By slowing down your movement, you're actually turning it into a high-intensity exercise. Super Slow movement allows your muscle, at the microscopic level, to access the maximum number of cross-bridges between the protein filaments that produce movement in the muscle.   5.   Optimize Your Vitamin D Levels Vitamin D, a steroid hormone, is essential for the healthy development of the nucleus of the sperm cell, and helps maintain semen quality and sperm count. Vitamin D also increases levels of testosterone, which may boost libido. In one study, overweight men who were given vitamin D supplements had a significant increase  in testosterone levels after one year.5   6.   Reduce Stress When you're under a lot of stress, your body releases high levels of the stress hormone cortisol. This hormone actually blocks the effects of testosterone,6 presumably because, from a biological standpoint, testosterone-associated behaviors (mating, competing, aggression) may have lowered your chances of survival in an emergency (hence, the "fight or flight" response is dominant, courtesy of cortisol).  7.   Limit or Eliminate Sugar from Your Diet Testosterone levels decrease after you eat sugar, which is likely because the sugar leads to a high insulin  level, another factor leading to low testosterone.7 Based on USDA estimates, the average American consumes 12 teaspoons of sugar a day, which equates to about TWO TONS of sugar during a lifetime.  8.   Eat Healthy Fats By healthy, this means not only mon- and polyunsaturated fats, like that found in avocadoes and nuts, but also saturated, as these are essential for building testosterone. Research shows that a diet with less than 40 percent of energy as fat (and that mainly from animal sources, i.e. saturated) lead to a decrease in testosterone levels.8 My personal diet is about 60-70 percent healthy fat, and other experts agree that the ideal diet includes somewhere between 50-70 percent fat.  It's important to understand that your body requires saturated fats from animal and vegetable sources (such as meat, dairy, certain oils, and tropical plants like coconut) for optimal functioning, and if you neglect this important food group in favor of sugar, grains and other starchy carbs, your health and weight are almost guaranteed to suffer. Examples of healthy fats you can eat more of to give your testosterone levels a boost include: Olives and Olive oil  Coconuts and coconut oil Butter made from raw grass-fed organic milk Raw nuts, such as, almonds or pecans Organic pastured egg yolks Avocados Grass-fed meats Palm oil Unheated organic nut oils   9.   Boost Your Intake of Branch Chain Amino Acids (BCAA) from Foods Like Whey Protein Research suggests that BCAAs result in higher testosterone levels, particularly when taken along with resistance training.9 While BCAAs are available in supplement form, you'll find the highest concentrations of BCAAs like leucine in dairy products - especially quality cheeses and whey protein. Even when getting leucine from your natural food supply, it's often wasted or used as a building block instead of an anabolic agent. So to create the correct anabolic environment, you need  to boost leucine consumption way beyond mere maintenance levels. That said, keep in mind that using leucine as a free form amino acid can be highly counterproductive as when free form amino acids are artificially administrated, they rapidly enter your circulation while disrupting insulin function, and impairing your body's glycemic control. Food-based leucine is really the ideal form that can benefit your muscles without side effects.

## 2015-08-13 LAB — BASIC METABOLIC PANEL WITH GFR
BUN: 14 mg/dL (ref 7–25)
CALCIUM: 10 mg/dL (ref 8.6–10.3)
CO2: 28 mmol/L (ref 20–31)
Chloride: 100 mmol/L (ref 98–110)
Creat: 0.92 mg/dL (ref 0.60–1.35)
GFR, Est Non African American: >89 mL/min (ref >=60)
Glucose, Bld: 85 mg/dL (ref 65–99)
Potassium: 4.1 mmol/L (ref 3.5–5.3)
Sodium: 139 mmol/L (ref 135–146)

## 2015-08-13 LAB — URINALYSIS, ROUTINE W REFLEX MICROSCOPIC
Bilirubin Urine: NEGATIVE
Glucose, UA: NEGATIVE
HGB URINE DIPSTICK: NEGATIVE
KETONES UR: NEGATIVE
Leukocytes, UA: NEGATIVE
NITRITE: NEGATIVE
PH: 5.5 (ref 5.0–8.0)
Protein, ur: NEGATIVE
Specific Gravity, Urine: 1.023 (ref 1.001–1.035)

## 2015-08-13 LAB — LIPID PANEL
CHOLESTEROL: 198 mg/dL (ref 125–200)
HDL: 25 mg/dL — ABNORMAL LOW (ref >=40)
TRIGLYCERIDES: 651 mg/dL — AB (ref <150)
Total CHOL/HDL Ratio: 7.9 Ratio — ABNORMAL HIGH (ref <=5.0)

## 2015-08-13 LAB — HEPATIC FUNCTION PANEL
ALBUMIN: 4.7 g/dL (ref 3.6–5.1)
ALT: 36 U/L (ref 9–46)
AST: 34 U/L (ref 10–40)
Alkaline Phosphatase: 79 U/L (ref 40–115)
Bilirubin, Direct: 0.2 mg/dL (ref <=0.2)
Indirect Bilirubin: 1 mg/dL (ref 0.2–1.2)
Total Bilirubin: 1.2 mg/dL (ref 0.2–1.2)
Total Protein: 7.6 g/dL (ref 6.1–8.1)

## 2015-08-13 LAB — INSULIN, FASTING: INSULIN FASTING, SERUM: 15.1 u[IU]/mL (ref 2.0–19.6)

## 2015-08-13 LAB — VITAMIN D 25 HYDROXY (VIT D DEFICIENCY, FRACTURES): Vit D, 25-Hydroxy: 29 ng/mL — ABNORMAL LOW (ref 30–100)

## 2015-08-13 LAB — TSH: TSH: 1.815 u[IU]/mL (ref 0.350–4.500)

## 2015-08-13 LAB — MICROALBUMIN / CREATININE URINE RATIO
CREATININE, URINE: 220.5 mg/dL
Microalb Creat Ratio: 2.7 mg/g (ref 0.0–30.0)
Microalb, Ur: 0.6 mg/dL (ref <2.0)

## 2015-08-13 LAB — HEMOGLOBIN A1C
Hgb A1c MFr Bld: 5.4 % (ref <5.7)
Mean Plasma Glucose: 108 mg/dL (ref <117)

## 2015-08-13 LAB — MAGNESIUM: Magnesium: 2.1 mg/dL (ref 1.5–2.5)

## 2015-09-04 ENCOUNTER — Other Ambulatory Visit: Payer: Self-pay | Admitting: Physician Assistant

## 2015-09-04 ENCOUNTER — Other Ambulatory Visit: Payer: Self-pay | Admitting: Internal Medicine

## 2015-09-16 ENCOUNTER — Ambulatory Visit: Payer: Self-pay | Admitting: Physician Assistant

## 2016-01-10 ENCOUNTER — Other Ambulatory Visit: Payer: Self-pay | Admitting: Internal Medicine

## 2016-01-10 ENCOUNTER — Other Ambulatory Visit: Payer: Self-pay | Admitting: Physician Assistant

## 2016-02-18 ENCOUNTER — Ambulatory Visit: Payer: Self-pay | Admitting: Internal Medicine

## 2016-02-27 ENCOUNTER — Ambulatory Visit (INDEPENDENT_AMBULATORY_CARE_PROVIDER_SITE_OTHER): Payer: 59 | Admitting: Physician Assistant

## 2016-02-27 ENCOUNTER — Encounter: Payer: Self-pay | Admitting: Physician Assistant

## 2016-02-27 VITALS — BP 140/90 | HR 83 | Temp 97.7°F | Resp 16 | Ht 69.0 in | Wt 205.0 lb

## 2016-02-27 DIAGNOSIS — M7711 Lateral epicondylitis, right elbow: Secondary | ICD-10-CM | POA: Diagnosis not present

## 2016-02-27 MED ORDER — DEXAMETHASONE SODIUM PHOSPHATE 100 MG/10ML IJ SOLN
10.0000 mg | Freq: Once | INTRAMUSCULAR | Status: AC
  Administered 2016-02-27: 10 mg via INTRAMUSCULAR

## 2016-02-27 MED ORDER — CELECOXIB 200 MG PO CAPS: 200.0000 mg | ORAL_CAPSULE | Freq: Two times a day (BID) | ORAL | Status: DC

## 2016-02-27 NOTE — Patient Instructions (Signed)
Lateral Epicondylitis With Rehab Lateral epicondylitis involves inflammation and pain around the outer portion of the elbow. The pain is caused by inflammation of the tendons in the forearm that bring back (extend) the wrist. Lateral epicondylitis is also called tennis elbow, because it is very common in tennis players. However, it may occur in any individual who extends the wrist repetitively. If lateral epicondylitis is left untreated, it may become a chronic problem. SYMPTOMS   Pain, tenderness, and inflammation on the outer (lateral) side of the elbow.  Pain or weakness with gripping activities.  Pain that increases with wrist-twisting motions (playing tennis, using a screwdriver, opening a door or a jar).  Pain with lifting objects, including a coffee cup. CAUSES  Lateral epicondylitis is caused by inflammation of the tendons that extend the wrist. Causes of injury may include:  Repetitive stress and strain on the muscles and tendons that extend the wrist.  Sudden change in activity level or intensity.  Incorrect grip in racquet sports.  Incorrect grip size of racquet (often too large).  Incorrect hitting position or technique (usually backhand, leading with the elbow).  Using a racket that is too heavy. RISK INCREASES WITH:  Sports or occupations that require repetitive and/or strenuous forearm and wrist movements (tennis, squash, racquetball, carpentry).  Poor wrist and forearm strength and flexibility.  Failure to warm up properly before activity.  Resuming activity before healing, rehabilitation, and conditioning are complete. PREVENTION   Warm up and stretch properly before activity.  Maintain physical fitness:  Strength, flexibility, and endurance.  Cardiovascular fitness.  Wear and use properly fitted equipment.  Learn and use proper technique and have a coach correct improper technique.  Wear a tennis elbow (counterforce) brace. PROGNOSIS  The course of  this condition depends on the degree of the injury. If treated properly, acute cases (symptoms lasting less than 4 weeks) are often resolved in 2 to 6 weeks. Chronic (longer lasting cases) often resolve in 3 to 6 months but may require physical therapy. RELATED COMPLICATIONS   Frequently recurring symptoms, resulting in a chronic problem. Properly treating the problem the first time decreases frequency of recurrence.  Chronic inflammation, scarring tendon degeneration, and partial tendon tear, requiring surgery.  Delayed healing or resolution of symptoms. TREATMENT  Treatment first involves the use of ice and medicine to reduce pain and inflammation. Strengthening and stretching exercises may help reduce discomfort if performed regularly. These exercises may be performed at home if the condition is an acute injury. Chronic cases may require a referral to a physical therapist for evaluation and treatment. Your caregiver may advise a corticosteroid injection to help reduce inflammation. Rarely, surgery is needed. MEDICATION  If pain medicine is needed, nonsteroidal anti-inflammatory medicines (aspirin and ibuprofen), or other minor pain relievers (acetaminophen), are often advised.  Do not take pain medicine for 7 days before surgery.  Prescription pain relievers may be given, if your caregiver thinks they are needed. Use only as directed and only as much as you need.  Corticosteroid injections may be recommended. These injections should be reserved only for the most severe cases, because they can only be given a certain number of times. HEAT AND COLD  Cold treatment (icing) should be applied for 10 to 15 minutes every 2 to 3 hours for inflammation and pain, and immediately after activity that aggravates your symptoms. Use ice packs or an ice massage.  Heat treatment may be used before performing stretching and strengthening activities prescribed by your caregiver, physical therapist,   or  athletic trainer. Use a heat pack or a warm water soak. SEEK MEDICAL CARE IF: Symptoms get worse or do not improve in 2 weeks, despite treatment. EXERCISES  RANGE OF MOTION (ROM) AND STRETCHING EXERCISES - Epicondylitis, Lateral (Tennis Elbow) These exercises may help you when beginning to rehabilitate your injury. Your symptoms may go away with or without further involvement from your physician, physical therapist, or athletic trainer. While completing these exercises, remember:   Restoring tissue flexibility helps normal motion to return to the joints. This allows healthier, less painful movement and activity.  An effective stretch should be held for at least 30 seconds.  A stretch should never be painful. You should only feel a gentle lengthening or release in the stretched tissue. RANGE OF MOTION - Wrist Flexion, Active-Assisted  Extend your right / left elbow with your fingers pointing down.*  Gently pull the back of your hand towards you, until you feel a gentle stretch on the top of your forearm.  Hold this position for __________ seconds. Repeat __________ times. Complete this exercise __________ times per day.  *If directed by your physician, physical therapist or athletic trainer, complete this stretch with your elbow bent, rather than extended. RANGE OF MOTION - Wrist Extension, Active-Assisted  Extend your right / left elbow and turn your palm upwards.*  Gently pull your palm and fingertips back, so your wrist extends and your fingers point more toward the ground.  You should feel a gentle stretch on the inside of your forearm.  Hold this position for __________ seconds. Repeat __________ times. Complete this exercise __________ times per day. *If directed by your physician, physical therapist or athletic trainer, complete this stretch with your elbow bent, rather than extended. STRETCH - Wrist Flexion  Place the back of your right / left hand on a tabletop, leaving your  elbow slightly bent. Your fingers should point away from your body.  Gently press the back of your hand down onto the table by straightening your elbow. You should feel a stretch on the top of your forearm.  Hold this position for __________ seconds. Repeat __________ times. Complete this stretch __________ times per day.  STRETCH - Wrist Extension   Place your right / left fingertips on a tabletop, leaving your elbow slightly bent. Your fingers should point backwards.  Gently press your fingers and palm down onto the table by straightening your elbow. You should feel a stretch on the inside of your forearm.  Hold this position for __________ seconds. Repeat __________ times. Complete this stretch __________ times per day.  STRENGTHENING EXERCISES - Epicondylitis, Lateral (Tennis Elbow) These exercises may help you when beginning to rehabilitate your injury. They may resolve your symptoms with or without further involvement from your physician, physical therapist, or athletic trainer. While completing these exercises, remember:   Muscles can gain both the endurance and the strength needed for everyday activities through controlled exercises.  Complete these exercises as instructed by your physician, physical therapist or athletic trainer. Increase the resistance and repetitions only as guided.  You may experience muscle soreness or fatigue, but the pain or discomfort you are trying to eliminate should never worsen during these exercises. If this pain does get worse, stop and make sure you are following the directions exactly. If the pain is still present after adjustments, discontinue the exercise until you can discuss the trouble with your caregiver. STRENGTH - Wrist Flexors  Sit with your right / left forearm palm-up and fully supported   on a table or countertop. Your elbow should be resting below the height of your shoulder. Allow your wrist to extend over the edge of the  surface.  Loosely holding a __________ weight, or a piece of rubber exercise band or tubing, slowly curl your hand up toward your forearm.  Hold this position for __________ seconds. Slowly lower the wrist back to the starting position in a controlled manner. Repeat __________ times. Complete this exercise __________ times per day.  STRENGTH - Wrist Extensors  Sit with your right / left forearm palm-down and fully supported on a table or countertop. Your elbow should be resting below the height of your shoulder. Allow your wrist to extend over the edge of the surface.  Loosely holding a __________ weight, or a piece of rubber exercise band or tubing, slowly curl your hand up toward your forearm.  Hold this position for __________ seconds. Slowly lower the wrist back to the starting position in a controlled manner. Repeat __________ times. Complete this exercise __________ times per day.  STRENGTH - Ulnar Deviators  Stand with a ____________________ weight in your right / left hand, or sit while holding a rubber exercise band or tubing, with your healthy arm supported on a table or countertop.  Move your wrist, so that your pinkie travels toward your forearm and your thumb moves away from your forearm.  Hold this position for __________ seconds and then slowly lower the wrist back to the starting position. Repeat __________ times. Complete this exercise __________ times per day STRENGTH - Radial Deviators  Stand with a ____________________ weight in your right / left hand, or sit while holding a rubber exercise band or tubing, with your injured arm supported on a table or countertop.  Raise your hand upward in front of you or pull up on the rubber tubing.  Hold this position for __________ seconds and then slowly lower the wrist back to the starting position. Repeat __________ times. Complete this exercise __________ times per day. STRENGTH - Forearm Supinators   Sit with your right /  left forearm supported on a table, keeping your elbow below shoulder height. Rest your hand over the edge, palm down.  Gently grip a hammer or a soup ladle.  Without moving your elbow, slowly turn your palm and hand upward to a "thumbs-up" position.  Hold this position for __________ seconds. Slowly return to the starting position. Repeat __________ times. Complete this exercise __________ times per day.  STRENGTH - Forearm Pronators   Sit with your right / left forearm supported on a table, keeping your elbow below shoulder height. Rest your hand over the edge, palm up.  Gently grip a hammer or a soup ladle.  Without moving your elbow, slowly turn your palm and hand upward to a "thumbs-up" position.  Hold this position for __________ seconds. Slowly return to the starting position. Repeat __________ times. Complete this exercise __________ times per day.  STRENGTH - Grip  Grasp a tennis ball, a dense sponge, or a large, rolled sock in your hand.  Squeeze as hard as you can, without increasing any pain.  Hold this position for __________ seconds. Release your grip slowly. Repeat __________ times. Complete this exercise __________ times per day.  STRENGTH - Elbow Extensors, Isometric  Stand or sit upright, on a firm surface. Place your right / left arm so that your palm faces your stomach, and it is at the height of your waist.  Place your opposite hand on the underside   of your forearm. Gently push up as your right / left arm resists. Push as hard as you can with both arms, without causing any pain or movement at your right / left elbow. Hold this stationary position for __________ seconds. Gradually release the tension in both arms. Allow your muscles to relax completely before repeating.   This information is not intended to replace advice given to you by your health care provider. Make sure you discuss any questions you have with your health care provider.   Document Released:  11/08/2005 Document Revised: 11/29/2014 Document Reviewed: 02/20/2009 Elsevier Interactive Patient Education 2016 [employer]  

## 2016-02-27 NOTE — Progress Notes (Signed)
   Subjective:    Patient ID: Walter Wilkinson, male    DOB: 08/19/1971, 45 y.o.   MRN: [id number]  HPI 45 y.o. right handed WM presents for right elbow pain x several months. No injury, but states started after cutting truck full of wood. Worse with grip. Has tried icy hot, ice bag, ibuprofen 2-3 times a week and on meloxicam daily, helps some.   Blood pressure 140/90, pulse 83, temperature 97.7 F (36.5 C), temperature source Temporal, resp. rate 16, height 5\' 9"  (1.753 m), weight 205 lb (92.987 kg), SpO2 98 %.  Past Medical History  Diagnosis Date  . Dyslipidemia   . HTN (hypertension)    Current Outpatient Prescriptions on File Prior to Visit  Medication Sig Dispense Refill  . ALPRAZolam (XANAX) 0.5 MG tablet Take 1 tablet (0.5 mg total) by mouth 2 (two) times daily as needed for anxiety or sleep. 60 tablet 0  . Cholecalciferol (VITAMIN D-3) 5000 UNITS TABS Take by mouth 2 (two) times daily.    . citalopram (CELEXA) 40 MG tablet TAKE ONE TABLET BY MOUTH ONCE DAILY 90 tablet 0  . fenofibrate 160 MG tablet Take 1 tablet (160 mg total) by mouth daily. 90 tablet 0  . lisinopril (PRINIVIL,ZESTRIL) 20 MG tablet TAKE ONE TABLET BY MOUTH ONCE DAILY FOR BLOOD PRESSURE 90 tablet 0  . meloxicam (MOBIC) 15 MG tablet TAKE ONE TABLET BY MOUTH ONCE DAILY WITH FOOD 90 tablet 0   No current facility-administered medications on file prior to visit.    Review of Systems  Constitutional: Negative.   HENT: Negative.   Respiratory: Negative.   Cardiovascular: Negative.   Gastrointestinal: Negative.   Genitourinary: Negative.   Musculoskeletal: Positive for joint swelling and arthralgias. Negative for myalgias, back pain, gait problem, neck pain and neck stiffness.  Skin: Negative.   Neurological: Negative.   Psychiatric/Behavioral: Negative.        Objective:   Physical Exam  Constitutional: He appears well-developed and well-nourished.  Cardiovascular: Normal rate and regular rhythm.    Pulmonary/Chest: Effort normal and breath sounds normal.  Musculoskeletal:  Right shoulder and right wrist full ROM and nontender.right elbow without erythema, warmth. with minor swelling, with tenderness at lateral epicondyle with decreased grip. Pain with supination against resistance. Good distal pulses, cap refill, and sensation intact.  ROM Intact bilaterally in upper extremities.       Assessment & Plan:  Right lateral epicondylitis-  Injection- area cleaned with alcohol, Dexamethasone 10mg  and 1 CC lidocaine injected into lateral epicondyle, tolerated well with immediate relief.  Start celebrex, stop mobic for now, RICE, and exercises given, can get brace If not better with refer to orthopedics.   Future Appointments Date Time Provider Department Center  08/17/2016 2:00 PM Quentin MullingAmanda Chelci [place], PA-C [facility] None

## 2016-04-30 ENCOUNTER — Ambulatory Visit: Payer: Self-pay | Admitting: Physician Assistant

## 2016-05-12 ENCOUNTER — Other Ambulatory Visit: Payer: Self-pay | Admitting: Physician Assistant

## 2016-05-14 ENCOUNTER — Other Ambulatory Visit: Payer: Self-pay | Admitting: Physician Assistant

## 2016-05-17 ENCOUNTER — Encounter: Payer: Self-pay | Admitting: Physician Assistant

## 2016-08-17 ENCOUNTER — Encounter: Payer: Self-pay | Admitting: Physician Assistant

## 2016-08-17 ENCOUNTER — Ambulatory Visit (INDEPENDENT_AMBULATORY_CARE_PROVIDER_SITE_OTHER): Payer: 59 | Admitting: Physician Assistant

## 2016-08-17 VITALS — BP 128/68 | HR 73 | Temp 97.3°F | Resp 16 | Ht 68.5 in | Wt 209.0 lb

## 2016-08-17 DIAGNOSIS — F411 Generalized anxiety disorder: Secondary | ICD-10-CM

## 2016-08-17 DIAGNOSIS — R6889 Other general symptoms and signs: Secondary | ICD-10-CM | POA: Diagnosis not present

## 2016-08-17 DIAGNOSIS — E8881 Metabolic syndrome: Secondary | ICD-10-CM | POA: Diagnosis not present

## 2016-08-17 DIAGNOSIS — Z720 Tobacco use: Secondary | ICD-10-CM | POA: Diagnosis not present

## 2016-08-17 DIAGNOSIS — E785 Hyperlipidemia, unspecified: Secondary | ICD-10-CM | POA: Diagnosis not present

## 2016-08-17 DIAGNOSIS — E669 Obesity, unspecified: Secondary | ICD-10-CM | POA: Diagnosis not present

## 2016-08-17 DIAGNOSIS — E559 Vitamin D deficiency, unspecified: Secondary | ICD-10-CM | POA: Diagnosis not present

## 2016-08-17 DIAGNOSIS — I10 Essential (primary) hypertension: Secondary | ICD-10-CM | POA: Diagnosis not present

## 2016-08-17 DIAGNOSIS — F528 Other sexual dysfunction not due to a substance or known physiological condition: Secondary | ICD-10-CM

## 2016-08-17 DIAGNOSIS — E291 Testicular hypofunction: Secondary | ICD-10-CM

## 2016-08-17 DIAGNOSIS — Z79899 Other long term (current) drug therapy: Secondary | ICD-10-CM

## 2016-08-17 DIAGNOSIS — E88819 Insulin resistance, unspecified: Secondary | ICD-10-CM

## 2016-08-17 DIAGNOSIS — Z0001 Encounter for general adult medical examination with abnormal findings: Secondary | ICD-10-CM

## 2016-08-17 DIAGNOSIS — Z1159 Encounter for screening for other viral diseases: Secondary | ICD-10-CM | POA: Diagnosis not present

## 2016-08-17 DIAGNOSIS — M7711 Lateral epicondylitis, right elbow: Secondary | ICD-10-CM

## 2016-08-17 LAB — CBC WITH DIFFERENTIAL/PLATELET
BASOS ABS: 84 {cells}/uL (ref 0–200)
Basophils Relative: 1 %
EOS ABS: 168 {cells}/uL (ref 15–500)
Eosinophils Relative: 2 %
HEMATOCRIT: 46.6 % (ref 38.5–50.0)
HEMOGLOBIN: 15.8 g/dL (ref 13.2–17.1)
LYMPHS ABS: 2352 {cells}/uL (ref 850–3900)
LYMPHS PCT: 28 %
MCH: 31.8 pg (ref 27.0–33.0)
MCHC: 33.9 g/dL (ref 32.0–36.0)
MCV: 93.8 fL (ref 80.0–100.0)
MONO ABS: 588 {cells}/uL (ref 200–950)
MPV: 10.2 fL (ref 7.5–12.5)
Monocytes Relative: 7 %
NEUTROS PCT: 62 %
Neutro Abs: 5208 cells/uL (ref 1500–7800)
Platelets: 252 10*3/uL (ref 140–400)
RBC: 4.97 MIL/uL (ref 4.20–5.80)
RDW: 13.4 % (ref 11.0–15.0)
WBC: 8.4 10*3/uL (ref 3.8–10.8)

## 2016-08-17 LAB — TSH: TSH: 2.54 mIU/L (ref 0.40–4.50)

## 2016-08-17 MED ORDER — FENOFIBRATE 160 MG PO TABS: 160.0000 mg | ORAL_TABLET | Freq: Every day | ORAL | 0 refills | Status: DC

## 2016-08-17 MED ORDER — CITALOPRAM HYDROBROMIDE 40 MG PO TABS: 40.0000 mg | ORAL_TABLET | Freq: Every day | ORAL | 0 refills | Status: DC

## 2016-08-17 MED ORDER — LISINOPRIL 20 MG PO TABS: ORAL_TABLET | ORAL | 0 refills | Status: DC

## 2016-08-17 MED ORDER — MELOXICAM 15 MG PO TABS: ORAL_TABLET | ORAL | 0 refills | Status: DC

## 2016-08-17 NOTE — Patient Instructions (Addendum)
Please call Dr. Ophelia CharterYates' group and make an appointment for your elbow Phone: [phone] Get brace for your elbow, try to continue to ice/rest it  Get on zantac 300mg  once at night to see if it helps with drinking water in the AM   Your trigs are not in range,  Triglycerides are simple sugars in blood that are converted into a storage form. I recommend you avoid fried/greasy foods, sweets/candy, white rice , white potatoes,  anything made from white flour, sweet tea, soda, fruit juices and avoid alcohol in excess. Sweet potatoes, brown/wild rice/Quinoa, Vegetarian, spinach, or wheat pasta, Multi-grain bread - like multi-grain flat bread or sandwich thins are okay. This is elevated enough to cause acute pancreatitis which can put you in the hospital and kill you. VERY VERY important to be strict with diet.  STAY ON FENOFIBRATE!!!!! And STRICT DIET, NO SODA!  Tumeric with black pepper extract is a great natural antiinflammatory that helps with arthritis and aches and pain. Can get from [employer] or any health food store. Need to take at least 800mg  twice a day with food.   Fish oil can be obtained from eating fish or by taking supplements. Fish that are especially rich in the beneficial oils known as omega-3 fatty acids include mackerel, tuna, salmon, sturgeon, mullet, bluefish, anchovy, sardines, herring, trout, and menhaden. They provide about 1 gram of omega-3 fatty acids in about 3.5 ounces of fish.  Fish oil supplements are usually made from mackerel, herring, tuna, halibut, salmon, cod liver, whale blubber, or seal blubber. Fish oil supplements often contain small amounts of vitamin E to prevent spoilage. They might also be combined with calcium, iron, or vitamins A, B1, B2, B3, C, or D.  Fish oil is used for a wide range of conditions. It is most often used for conditions related to the heart and blood system. Some people use fish oil to lower blood pressure or triglyceride levels (fats related to  cholesterol). Fish oil has also been tried for preventing heart disease or stroke. The scientific evidence suggests that fish oil really does lower high triglycerides, and it also seems to help prevent heart disease and stroke when taken in the recommended amounts. Ironically, taking too much fish oil can actually increase the risk of stroke.  Fish may have earned its reputation as "brain food" because some people eat fish to help with depression, psychosis, attention deficit-hyperactivity disorder (ADHD), Alzheimer's disease, and other thinking disorders.  Some people use fish oil for dry eyes, glaucoma, and age-related macular degeneration (AMD), a very common condition in older people that can lead to serious sight problems.  Women sometimes take fish oil to prevent painful periods; breast pain; and complications associated with pregnancy such as miscarriage, high blood pressure late in pregnancy, and early delivery.  Fish oil is also used for diabetes, asthma, developmental coordination disorders, movement disorders, dyslexia, obesity, kidney disease, weak bones (osteoporosis), certain diseases related to pain and swelling such as psoriasis, and preventing weight loss caused by some cancer drugs.  Fish oil is sometimes used after heart transplant surgery to prevent high blood pressure and kidney damage that can be caused by the surgery itself or by drugs used to reduce the chances that the body will reject the new heart. Fish oil is sometimes used after coronary artery bypass surgery. It seems to help keep the blood vessel that has been rerouted from closing up.  When fish oil is obtained by eating fish, the way the fish is  prepared seems to make a difference. Eating broiled or baked fish appears to reduce the risk of heart disease, but eating fried fish or fish sandwiches not only cancels out the benefits of fish oil, but may actually increase heart disease risk.  Two of the most important omega-3  fatty acids contained in fish oil are eicosapentaenoic acid (EPA) and docosahexaenoic acid (DHA).  How does it work? A lot of the benefit of fish oil seems to come from the omega-3 fatty acids that it contains. Interestingly, the body does not produce its own omega-3 fatty acids. Nor can the body make omega-3 fatty acids from omega-6 fatty acids, which are common in the Western diet. A lot of research has been done on EPA and DHA, two types of omega-3 acids that are often included in fish oil supplements.  Omega-3 fatty acids reduce pain and swelling. This may explain why fish oil is likely effective for psoriasis and dry eyes. These fatty acids also prevent the blood from clotting easily. this might make fish oil helpful for some heart conditions.

## 2016-08-17 NOTE — Progress Notes (Signed)
Complete Physical  Assessment and Plan: Essential hypertension, benign - CBC with Differential/Platelet - BASIC METABOLIC PANEL WITH GFR - Hepatic function panel - TSH - Urinalysis, Routine w reflex microscopic (not at [facility]) - Microalbumin / creatinine urine ratio - EKG 12-Lead  Hyperlipidemia -continue medications, check lipids, decrease fatty foods, increase activity.  - Lipid panel   Hypogonadism in male Declines treatment, will not check.    Insulin resistance - Hemoglobin A1c - Insulin, fasting  Vitamin D deficiency - Vit D  25 hydroxy (rtn osteoporosis monitoring)  Medication management - Magnesium  Tobacco chew use Advised to stop chewing, see dentist.   ERECTILE DYSFUNCTION  Anxiety state continue medications, stress management techniques discussed, increase water, good sleep hygiene discussed, increase exercise, and increase veggies.   Encounter for general adult medical examination with abnormal findings - CBC with Differential/Platelet - BASIC METABOLIC PANEL WITH GFR - Hepatic function panel - TSH - Lipid panel - Hemoglobin A1c - Insulin, fasting - Magnesium - Vit D  25 hydroxy (rtn osteoporosis monitoring) - Urinalysis, Routine w reflex microscopic (not at [facility]) - Microalbumin / creatinine urine ratio - EKG 12-Lead  Obesity Obesity with co morbidities- long discussion about weight loss, diet, and exercise  Right elbow pain likely right lateral epicondylitis/arthritis Has tried conservative treatment, get brace, will refer to ortho.   Discussed med's effects and SE's. Screening labs and tests as requested with regular follow-up as recommended.  HPI 45 y.o. male  presents for a complete physical. His blood pressure has been controlled at home, today their BP is BP: 128/68 He does workout. He denies chest pain, shortness of breath, dizziness.  Still doing his own business, [employer].  Right handed, continues to have right lateral elbow pain,  worse with gripping, has iced it, has had injection, has been on celebrex without help, has not gotten brace.  He is not on cholesterol medication, but suppose to be on fenofibrate but has been out. His cholesterol is not at goal. Not fasting today, drinks 1-2 pepsi a day. The cholesterol last visit was:   Lab Results  Component Value Date   CHOL 198 08/12/2015   HDL 25 (L) 08/12/2015   LDLCALC NOT CALC 08/12/2015   TRIG 651 (H) 08/12/2015   CHOLHDL 7.9 (H) 08/12/2015   He has been working on diet and exercise for prediabetes and has decreased drinking, and denies paresthesia of the feet and polydipsia. Last A1C in the office was:  Lab Results  Component Value Date   HGBA1C 5.4 08/12/2015   Last testosterone was low but he is not on treatment.  Lab Results  Component Value Date   TESTOSTERONE 198 (L) 05/08/2014   Patient is on Vitamin D supplement, takes sporacidly. Lab Results  Component Value Date   VD25OH 29 (L) 08/12/2015   BMI is Body mass index is 31.32 kg/m., he is working on diet and exercise. Wt Readings from Last 3 Encounters:  08/17/16 209 lb (94.8 kg)  02/27/16 205 lb (93 kg)  08/12/15 216 lb (98 kg)    Current Medications:  Current Outpatient Prescriptions on File Prior to Visit  Medication Sig Dispense Refill  . celecoxib (CELEBREX) 200 MG capsule Take 1 capsule (200 mg total) by mouth 2 (two) times daily. 60 capsule 2  . Cholecalciferol (VITAMIN D-3) 5000 UNITS TABS Take by mouth 2 (two) times daily.    . citalopram (CELEXA) 40 MG tablet TAKE ONE TABLET BY MOUTH ONCE DAILY 90 tablet 0  .  fenofibrate 160 MG tablet Take 1 tablet (160 mg total) by mouth daily. 90 tablet 0  . lisinopril (PRINIVIL,ZESTRIL) 20 MG tablet TAKE ONE TABLET BY MOUTH ONCE DAILY FOR BLOOD PRESSURE 90 tablet 0  . meloxicam (MOBIC) 15 MG tablet TAKE ONE TABLET BY MOUTH ONCE DAILY WITH FOOD 90 tablet 0   No current facility-administered medications on file prior to visit.    Health  Maintenance:  Immunization History  Administered Date(s) Administered  . Tdap 10/22/2013   Tetanus: 2014 Pneumovax: N/A Prevnar 13: due 65 Flu vaccine:declines Zostavax:N/A DEXA:N/A Colonoscopy: N/A EGD: N/A  Medical History:  Past Medical History:  Diagnosis Date  . Dyslipidemia   . HTN (hypertension)    Allergies No Known Allergies  SURGICAL HISTORY He  has a past surgical history that includes Vasectomy and Shoulder arthroscopy w/ rotator cuff repair (Right, 04/2015). FAMILY HISTORY His family history includes Heart attack in his father; Heart disease in his father. SOCIAL HISTORY He  reports that he has never smoked. His smokeless tobacco use includes Chew. He reports that he drinks about 12.6 oz of alcohol per week . He reports that he does not use drugs.   Review of Systems  Constitutional: Negative.   HENT: Negative.   Eyes: Negative.   Respiratory: Negative.   Cardiovascular: Negative.   Gastrointestinal: Negative.   Genitourinary: Negative for dysuria, flank pain, frequency, hematuria and urgency.  Musculoskeletal: Positive for joint pain (right elbow). Negative for back pain, falls, myalgias (bilateral heel pain, worse in the AM) and neck pain.  Skin: Negative.   Neurological: Negative.   Psychiatric/Behavioral: Negative.     Physical Exam: Estimated body mass index is 31.32 kg/m as calculated from the following:   Height as of this encounter: 5' 8.5" (1.74 m).   Weight as of this encounter: 209 lb (94.8 kg). BP 128/68   Pulse 73   Temp 97.3 F (36.3 C)   Resp 16   Ht 5' 8.5" (1.74 m)   Wt 209 lb (94.8 kg)   SpO2 96%   BMI 31.32 kg/m  General Appearance: Well nourished, in no apparent distress. Eyes: PERRLA, EOMs, conjunctiva no swelling or erythema, normal fundi and vessels. Sinuses: No Frontal/maxillary tenderness ENT/Mouth: Ext aud canals clear, normal light reflex with TMs without erythema, bulging. Good dentition. No erythema, swelling, or  exudate on post pharynx. Tonsils not swollen or erythematous. Hearing normal.  Neck: Supple, thyroid normal. No bruits Respiratory: Respiratory effort normal, BS equal bilaterally without rales, rhonchi, wheezing or stridor. Cardio: RRR without murmurs, rubs or gallops. Brisk peripheral pulses without edema.  Chest: symmetric, with normal excursions and percussion. Abdomen: Soft, +BS. Non tender, no guarding, rebound, hernias, masses, or organomegaly. .  Lymphatics: Non tender without lymphadenopathy.  Genitourinary: defer Musculoskeletal: Full ROM all peripheral extremities,5/5 strength, and normal gait. Right shoulder and right wrist FROM, nontender, right elbow with mild swelling and tenderness at lateral epicondyle, with decreased grip due to pain, pain with supination, normal distal neurovascular exam.  Skin: Warm, dry without rashes, lesions, ecchymosis.  Neuro: Cranial nerves intact, reflexes equal bilaterally. Normal muscle tone, no cerebellar symptoms. Sensation intact.  Psych: Awake and oriented X 3, normal affect, Insight and Judgment appropriate.   EKG: WNL no changes.  Quentin MullingAmanda Collier 1:47 PM

## 2016-08-18 LAB — LIPID PANEL
CHOLESTEROL: 208 mg/dL — AB (ref 125–200)
HDL: 27 mg/dL — ABNORMAL LOW (ref >=40)
Total CHOL/HDL Ratio: 7.7 Ratio — ABNORMAL HIGH (ref <=5.0)
Triglycerides: 582 mg/dL — ABNORMAL HIGH (ref <150)

## 2016-08-18 LAB — HEPATIC FUNCTION PANEL
ALBUMIN: 4.5 g/dL (ref 3.6–5.1)
ALK PHOS: 64 U/L (ref 40–115)
ALT: 18 U/L (ref 9–46)
AST: 23 U/L (ref 10–40)
BILIRUBIN INDIRECT: 0.8 mg/dL (ref 0.2–1.2)
Bilirubin, Direct: 0.2 mg/dL (ref <=0.2)
TOTAL PROTEIN: 7.2 g/dL (ref 6.1–8.1)
Total Bilirubin: 1 mg/dL (ref 0.2–1.2)

## 2016-08-18 LAB — BASIC METABOLIC PANEL WITH GFR
BUN: 12 mg/dL (ref 7–25)
CALCIUM: 9.4 mg/dL (ref 8.6–10.3)
CO2: 26 mmol/L (ref 20–31)
Chloride: 102 mmol/L (ref 98–110)
Creat: 1.03 mg/dL (ref 0.60–1.35)
GFR, EST NON AFRICAN AMERICAN: 88 mL/min (ref >=60)
GLUCOSE: 86 mg/dL (ref 65–99)
Potassium: 4.1 mmol/L (ref 3.5–5.3)
Sodium: 140 mmol/L (ref 135–146)

## 2016-08-18 LAB — URINALYSIS, ROUTINE W REFLEX MICROSCOPIC
BILIRUBIN URINE: NEGATIVE
Glucose, UA: NEGATIVE
Hgb urine dipstick: NEGATIVE
KETONES UR: NEGATIVE
Leukocytes, UA: NEGATIVE
NITRITE: NEGATIVE
PROTEIN: NEGATIVE
Specific Gravity, Urine: 1.03 (ref 1.001–1.035)
pH: 6.5 (ref 5.0–8.0)

## 2016-08-18 LAB — MICROALBUMIN / CREATININE URINE RATIO
CREATININE, URINE: 385 mg/dL — AB (ref 20–370)
MICROALB UR: 0.9 mg/dL
Microalb Creat Ratio: 2 mcg/mg creat (ref <30)

## 2016-08-18 LAB — MAGNESIUM: Magnesium: 1.9 mg/dL (ref 1.5–2.5)

## 2016-08-18 LAB — HIV ANTIBODY (ROUTINE TESTING W REFLEX): HIV 1&2 Ab, 4th Generation: NONREACTIVE

## 2016-08-18 LAB — VITAMIN D 25 HYDROXY (VIT D DEFICIENCY, FRACTURES): VIT D 25 HYDROXY: 36 ng/mL (ref 30–100)

## 2017-03-16 ENCOUNTER — Other Ambulatory Visit: Payer: Self-pay | Admitting: Physician Assistant

## 2017-03-16 DIAGNOSIS — I10 Essential (primary) hypertension: Secondary | ICD-10-CM

## 2017-03-16 DIAGNOSIS — M7711 Lateral epicondylitis, right elbow: Secondary | ICD-10-CM

## 2017-03-17 ENCOUNTER — Other Ambulatory Visit: Payer: Self-pay

## 2017-03-17 DIAGNOSIS — I10 Essential (primary) hypertension: Secondary | ICD-10-CM

## 2017-03-17 MED ORDER — LISINOPRIL 20 MG PO TABS: ORAL_TABLET | ORAL | 0 refills | Status: DC

## 2017-07-22 ENCOUNTER — Other Ambulatory Visit: Payer: Self-pay | Admitting: Physician Assistant

## 2017-07-22 DIAGNOSIS — I10 Essential (primary) hypertension: Secondary | ICD-10-CM

## 2017-09-08 ENCOUNTER — Encounter: Payer: Self-pay | Admitting: Physician Assistant

## 2017-09-08 NOTE — Progress Notes (Deleted)
Complete Physical  Assessment and Plan: Essential hypertension, benign - CBC with Differential/Platelet - BASIC METABOLIC PANEL WITH GFR - Hepatic function panel - TSH - Urinalysis, Routine w reflex microscopic (not at [facility]) - Microalbumin / creatinine urine ratio - EKG 12-Lead  Hyperlipidemia -continue medications, check lipids, decrease fatty foods, increase activity.  - Lipid panel   Hypogonadism in male Declines treatment, will not check.    Insulin resistance - Hemoglobin A1c - Insulin, fasting  Vitamin D deficiency - Vit D  25 hydroxy (rtn osteoporosis monitoring)  Medication management - Magnesium  Tobacco chew use Advised to stop chewing, see dentist.   ERECTILE DYSFUNCTION  Anxiety state continue medications, stress management techniques discussed, increase water, good sleep hygiene discussed, increase exercise, and increase veggies.   Encounter for general adult medical examination with abnormal findings  Obesity Obesity with co morbidities- long discussion about weight loss, diet, and exercise  Right elbow pain likely right lateral epicondylitis/arthritis Has tried conservative treatment, get brace, will refer to ortho.   Discussed med's effects and SE's. Screening labs and tests as requested with regular follow-up as recommended.  HPI 46 y.o. male  presents for a complete physical. His blood pressure has been controlled at home, today their BP is   He does workout. He denies chest pain, shortness of breath, dizziness.  Still doing his own business, [employer].  He is not on cholesterol medication, but suppose to be on fenofibrate but has been out. His cholesterol is not at goal. Not fasting today, drinks 1-2 pepsi a day. The cholesterol last visit was:   Lab Results  Component Value Date   CHOL 208 (H) 08/17/2016   HDL 27 (L) 08/17/2016   LDLCALC NOT CALC 08/17/2016   TRIG 582 (H) 08/17/2016   CHOLHDL 7.7 (H) 08/17/2016   He has been working on  diet and exercise for prediabetes and has decreased drinking, and denies paresthesia of the feet and polydipsia. Last A1C in the office was:  Lab Results  Component Value Date   HGBA1C 5.4 08/12/2015   Last testosterone was low but he is not on treatment.  Lab Results  Component Value Date   TESTOSTERONE 198 (L) 05/08/2014   Patient is on Vitamin D supplement, takes sporacidly. Lab Results  Component Value Date   VD25OH 36 08/17/2016   BMI is There is no height or weight on file to calculate BMI., he is working on diet and exercise. Wt Readings from Last 3 Encounters:  08/17/16 209 lb (94.8 kg)  02/27/16 205 lb (93 kg)  08/12/15 216 lb (98 kg)    Current Medications:  Current Outpatient Prescriptions on File Prior to Visit  Medication Sig Dispense Refill  . celecoxib (CELEBREX) 200 MG capsule TAKE ONE CAPSULE BY MOUTH TWICE DAILY 60 capsule 2  . Cholecalciferol (VITAMIN D-3) 5000 UNITS TABS Take by mouth 2 (two) times daily.    . citalopram (CELEXA) 40 MG tablet Take 1 tablet (40 mg total) by mouth daily. 90 tablet 0  . fenofibrate 160 MG tablet Take 1 tablet (160 mg total) by mouth daily. 90 tablet 0  . lisinopril (PRINIVIL,ZESTRIL) 20 MG tablet TAKE 1 TABLET BY MOUTH ONCE DAILY FOR BLOOD PRESSURE 90 tablet 0  . meloxicam (MOBIC) 15 MG tablet TAKE ONE TABLET BY MOUTH ONCE DAILY WITH FOOD 90 tablet 0   No current facility-administered medications on file prior to visit.    Health Maintenance:  Immunization History  Administered Date(s) Administered  . Tdap  10/22/2013   Tetanus: 2014 Pneumovax: N/A Prevnar 13: due 65 Flu vaccine:declines Zostavax:N/A DEXA:N/A Colonoscopy: N/A EGD: N/A  Medical History:  Past Medical History:  Diagnosis Date  . Dyslipidemia   . HTN (hypertension)    Allergies No Known Allergies  SURGICAL HISTORY He  has a past surgical history that includes Vasectomy and Shoulder arthroscopy w/ rotator cuff repair (Right, 04/2015). FAMILY  HISTORY His family history includes Heart attack in his father; Heart disease in his father. SOCIAL HISTORY He  reports that he has never smoked. His smokeless tobacco use includes Chew. He reports that he drinks about 12.6 oz of alcohol per week . He reports that he does not use drugs.   Review of Systems  Constitutional: Negative.   HENT: Negative.   Eyes: Negative.   Respiratory: Negative.   Cardiovascular: Negative.   Gastrointestinal: Negative.   Genitourinary: Negative for dysuria, flank pain, frequency, hematuria and urgency.  Musculoskeletal: Negative for back pain, falls, joint pain, myalgias and neck pain.  Skin: Negative.   Neurological: Negative.   Psychiatric/Behavioral: Negative.     Physical Exam: Estimated body mass index is 31.32 kg/m as calculated from the following:   Height as of 08/17/16: 5' 8.5" (1.74 m).   Weight as of 08/17/16: 209 lb (94.8 kg). There were no vitals taken for this visit. General Appearance: Well nourished, in no apparent distress. Eyes: PERRLA, EOMs, conjunctiva no swelling or erythema, normal fundi and vessels. Sinuses: No Frontal/maxillary tenderness ENT/Mouth: Ext aud canals clear, normal light reflex with TMs without erythema, bulging. Good dentition. No erythema, swelling, or exudate on post pharynx. Tonsils not swollen or erythematous. Hearing normal.  Neck: Supple, thyroid normal. No bruits Respiratory: Respiratory effort normal, BS equal bilaterally without rales, rhonchi, wheezing or stridor. Cardio: RRR without murmurs, rubs or gallops. Brisk peripheral pulses without edema.  Chest: symmetric, with normal excursions and percussion. Abdomen: Soft, +BS. Non tender, no guarding, rebound, hernias, masses, or organomegaly. .  Lymphatics: Non tender without lymphadenopathy.  Genitourinary: defer Musculoskeletal: Full ROM all peripheral extremities,5/5 strength, and normal gait.  Neuro: Cranial nerves intact, reflexes equal bilaterally.  Normal muscle tone, no cerebellar symptoms. Sensation intact.  Psych: Awake and oriented X 3, normal affect, Insight and Judgment appropriate.   EKG: WNL no changes.  Quentin MullingAmanda Camelia Stelzner 7:38 AM

## 2017-09-28 ENCOUNTER — Other Ambulatory Visit: Payer: Self-pay | Admitting: Physician Assistant

## 2017-09-28 DIAGNOSIS — M7711 Lateral epicondylitis, right elbow: Secondary | ICD-10-CM

## 2017-11-16 NOTE — Progress Notes (Signed)
Complete Physical  Assessment and Plan: Essential hypertension, benign - CBC with Differential/Platelet - BASIC METABOLIC PANEL WITH GFR - Hepatic function panel - TSH - Urinalysis, Routine w reflex microscopic ([facility]) - Microalbumin / creatinine urine ratio  Hyperlipidemia -continue medications, check lipids, decrease fatty foods, increase activity.  - Lipid panel   Hypogonadism in male Will check  Vitamin D deficiency - Vit D  25 hydroxy (rtn osteoporosis monitoring)  Medication management - Magnesium  Tobacco chew use Advised to stop chewing, see dentist.   ERECTILE DYSFUNCTION  Anxiety state continue medications, stress management techniques discussed, increase water, good sleep hygiene discussed, increase exercise, and increase veggies.   Encounter for general adult medical examination with abnormal findings  Obesity Obesity with co morbidities- long discussion about weight loss, diet, and exercise  Discussed med's effects and SE's. Screening labs and tests as requested with regular follow-up as recommended.  HPI 46 y.o. male  presents for a complete physical. His blood pressure has been controlled at home, today their BP is BP: 126/88 He does workout. He denies chest pain, shortness of breath, dizziness.  Still doing his own business, [employer].  He is not on cholesterol medication, but suppose to be on fenofibrate but has been out. His cholesterol is not at goal. Not fasting today. Drinking 1-2 pepsi a day.  The cholesterol last visit was:  Lab Results  Component Value Date   CHOL 208 (H) 08/17/2016   HDL 27 (L) 08/17/2016   LDLCALC NOT CALC 08/17/2016   TRIG 582 (H) 08/17/2016   CHOLHDL 7.7 (H) 08/17/2016   He has been working on diet and exercise for prediabetes and has decreased drinking, and denies paresthesia of the feet and polydipsia. Last A1C in the office was:  Lab Results  Component Value Date   HGBA1C 5.4 08/12/2015   Last  testosterone was low but he is not on treatment.  Lab Results  Component Value Date   TESTOSTERONE 198 (L) 05/08/2014   Patient is on Vitamin D supplement, takes sporacidly. Lab Results  Component Value Date   VD25OH 36 08/17/2016   BMI is Body mass index is 32.05 kg/m., he is working on diet and exercise. He stopped drinking any alcohol.  Wt Readings from Last 3 Encounters:  11/17/17 217 lb (98.4 kg)  08/17/16 209 lb (94.8 kg)  02/27/16 205 lb (93 kg)    Current Medications:  Current Outpatient Medications on File Prior to Visit  Medication Sig Dispense Refill  . celecoxib (CELEBREX) 200 MG capsule TAKE ONE CAPSULE BY MOUTH TWICE DAILY 60 capsule 2  . Cholecalciferol (VITAMIN D-3) 5000 UNITS TABS Take by mouth 2 (two) times daily.    . citalopram (CELEXA) 40 MG tablet Take 1 tablet (40 mg total) by mouth daily. 90 tablet 0  . fenofibrate 160 MG tablet Take 1 tablet (160 mg total) by mouth daily. 90 tablet 0  . lisinopril (PRINIVIL,ZESTRIL) 20 MG tablet TAKE 1 TABLET BY MOUTH ONCE DAILY FOR BLOOD PRESSURE 90 tablet 0  . meloxicam (MOBIC) 15 MG tablet TAKE 1 TABLET BY MOUTH ONCE DAILY WITH FOOD 90 tablet 0   No current facility-administered medications on file prior to visit.    Health Maintenance:  Immunization History  Administered Date(s) Administered  . Tdap 10/22/2013   Tetanus: 2014 Pneumovax: N/A Prevnar 13: due 65 Flu vaccine:declines Zostavax:N/A DEXA:N/A Colonoscopy: DUE age 46.  EGD: N/A  Medical History:  Past Medical History:  Diagnosis Date  . Dyslipidemia   .  HTN (hypertension)    Allergies No Known Allergies  SURGICAL HISTORY He  has a past surgical history that includes Vasectomy and Shoulder arthroscopy w/ rotator cuff repair (Right, 04/2015). FAMILY HISTORY His family history includes Heart attack in his father; Heart disease in his father. SOCIAL HISTORY He  reports that  has never smoked. His smokeless tobacco use includes chew. He reports  that he drinks about 12.6 oz of alcohol per week. He reports that he does not use drugs.   Review of Systems  Constitutional: Negative.   HENT: Negative.   Eyes: Negative.   Respiratory: Negative.   Cardiovascular: Negative.   Gastrointestinal: Negative.   Genitourinary: Negative for dysuria, flank pain, frequency, hematuria and urgency.  Musculoskeletal: Negative for back pain, falls, joint pain, myalgias (bilateral heel pain, worse in the AM) and neck pain.  Skin: Negative.   Neurological: Negative.   Psychiatric/Behavioral: Negative.     Physical Exam: Estimated body mass index is 32.05 kg/m as calculated from the following:   Height as of this encounter: 5\' 9"  (1.753 m).   Weight as of this encounter: 217 lb (98.4 kg). BP 126/88   Pulse 88   Temp (!) 97.5 F (36.4 C)   Resp 16   Ht 5\' 9"  (1.753 m)   Wt 217 lb (98.4 kg)   SpO2 96%   BMI 32.05 kg/m  General Appearance: Well nourished, in no apparent distress. Eyes: PERRLA, EOMs, conjunctiva no swelling or erythema, normal fundi and vessels. Sinuses: No Frontal/maxillary tenderness ENT/Mouth: Ext aud canals clear, normal light reflex with TMs without erythema, bulging. Good dentition. No erythema, swelling, or exudate on post pharynx. Tonsils not swollen or erythematous. Hearing normal.  Neck: Supple, thyroid normal. No bruits Respiratory: Respiratory effort normal, BS equal bilaterally without rales, rhonchi, wheezing or stridor. Cardio: RRR without murmurs, rubs or gallops. Brisk peripheral pulses without edema.  Chest: symmetric, with normal excursions and percussion. Abdomen: Soft, +BS. Non tender, no guarding, rebound, hernias, masses, or organomegaly. .  Lymphatics: Non tender without lymphadenopathy.  Genitourinary: defer Musculoskeletal: Full ROM all peripheral extremities,5/5 strength, and normal gait.  Skin: Warm, dry without rashes, lesions, ecchymosis.  Neuro: Cranial nerves intact, reflexes equal  bilaterally. Normal muscle tone, no cerebellar symptoms. Sensation intact.  Psych: Awake and oriented X 3, normal affect, Insight and Judgment appropriate.   EKG: defer next year  Quentin Mullingmanda Kellen Hover 11:19 AM

## 2017-11-17 ENCOUNTER — Ambulatory Visit: Payer: 59 | Admitting: Physician Assistant

## 2017-11-17 ENCOUNTER — Encounter: Payer: Self-pay | Admitting: Physician Assistant

## 2017-11-17 VITALS — BP 126/88 | HR 88 | Temp 97.5°F | Resp 16 | Ht 69.0 in | Wt 217.0 lb

## 2017-11-17 DIAGNOSIS — Z1211 Encounter for screening for malignant neoplasm of colon: Secondary | ICD-10-CM

## 2017-11-17 DIAGNOSIS — E8881 Metabolic syndrome: Secondary | ICD-10-CM

## 2017-11-17 DIAGNOSIS — Z6832 Body mass index (BMI) 32.0-32.9, adult: Secondary | ICD-10-CM

## 2017-11-17 DIAGNOSIS — E785 Hyperlipidemia, unspecified: Secondary | ICD-10-CM

## 2017-11-17 DIAGNOSIS — Z720 Tobacco use: Secondary | ICD-10-CM

## 2017-11-17 DIAGNOSIS — E291 Testicular hypofunction: Secondary | ICD-10-CM

## 2017-11-17 DIAGNOSIS — Z0000 Encounter for general adult medical examination without abnormal findings: Secondary | ICD-10-CM | POA: Diagnosis not present

## 2017-11-17 DIAGNOSIS — Z79899 Other long term (current) drug therapy: Secondary | ICD-10-CM

## 2017-11-17 DIAGNOSIS — I10 Essential (primary) hypertension: Secondary | ICD-10-CM

## 2017-11-17 DIAGNOSIS — F528 Other sexual dysfunction not due to a substance or known physiological condition: Secondary | ICD-10-CM

## 2017-11-17 DIAGNOSIS — E559 Vitamin D deficiency, unspecified: Secondary | ICD-10-CM

## 2017-11-17 DIAGNOSIS — F411 Generalized anxiety disorder: Secondary | ICD-10-CM

## 2017-11-17 NOTE — Patient Instructions (Addendum)
Your ears and sinuses are connected by the eustachian tube. When your sinuses are inflamed, this can close off the tube and cause fluid to collect in your middle ear. This can then cause dizziness, popping, clicking, ringing, and echoing in your ears. This is often NOT an infection and does NOT require antibiotics, it is caused by inflammation so the treatments help the inflammation. This can take a long time to get better so please be patient.  Here are things you can do to help with this: - Try the Flonase or Nasonex. Remember to spray each nostril twice towards the outer part of your eye.  Do not sniff but instead pinch your nose and tilt your head back to help the medicine get into your sinuses.  The best time to do this is at bedtime.Stop if you get blurred vision or nose bleeds.  -While drinking fluids, pinch and hold nose close and swallow, to help open eustachian tubes to drain fluid behind ear drums. -Please pick one of the over the counter allergy medications below and take it once daily for allergies.  It will also help with fluid behind ear drums. Claritin or loratadine cheapest but likely the weakest  Zyrtec or certizine at night because it can make you sleepy The strongest is allegra or fexafinadine  Cheapest at [employer], [employer], [employer] -can use decongestant over the counter, please do not use if you have high blood pressure or certain heart conditions.   if worsening HA, changes vision/speech, imbalance, weakness go to the ER  Stop the mobic, stay on the celebrex  celebrex is an antiinflammatory It helps pain, can not take with aleve, or ibuprofen or mobic You can take tylenol (500mg ) or tylenol arthritis (650mg ) with the meloxicam/antiinflammatories. The max you can take of tylenol a day is 3000mg  daily, this is a max of 6 pills a day of the regular tyelnol (500mg ) or a max of 4 a day of the tylenol arthritis (650mg ) as long as no other medications you are taking contain tylenol.    celebrex can cause inflammation in your stomach and can cause ulcers or bleeding, this will look like black tarry stools Make sure you take your celebrex with food Try not to take it daily, take AS needed Can take with zantac      Bad carbs also include fruit juice, alcohol, and sweet tea. These are empty calories that do not signal to your brain that you are full.   Please remember the good carbs are still carbs which convert into sugar. So please measure them out no more than 1/2-1 cup of rice, oatmeal, pasta, and beans  Veggies are however free foods! Pile them on.   Not all fruit is created equal. Please see the list below, the fruit at the bottom is higher in sugars than the fruit at the top. Please avoid all dried fruits.

## 2017-11-18 LAB — CBC WITH DIFFERENTIAL/PLATELET
BASOS PCT: 1.4 %
Basophils Absolute: 90 cells/uL (ref 0–200)
EOS PCT: 1.3 %
Eosinophils Absolute: 83 cells/uL (ref 15–500)
HEMATOCRIT: 47.7 % (ref 38.5–50.0)
HEMOGLOBIN: 16.6 g/dL (ref 13.2–17.1)
LYMPHS ABS: 1664 {cells}/uL (ref 850–3900)
MCH: 31 pg (ref 27.0–33.0)
MCHC: 34.8 g/dL (ref 32.0–36.0)
MCV: 89.2 fL (ref 80.0–100.0)
MONOS PCT: 8 %
MPV: 10.6 fL (ref 7.5–12.5)
NEUTROS ABS: 4051 {cells}/uL (ref 1500–7800)
Neutrophils Relative %: 63.3 %
Platelets: 279 10*3/uL (ref 140–400)
RBC: 5.35 10*6/uL (ref 4.20–5.80)
RDW: 12.4 % (ref 11.0–15.0)
Total Lymphocyte: 26 %
WBC mixed population: 512 cells/uL (ref 200–950)
WBC: 6.4 10*3/uL (ref 3.8–10.8)

## 2017-11-18 LAB — LIPID PANEL
Cholesterol: 169 mg/dL (ref <200)
HDL: 31 mg/dL — ABNORMAL LOW (ref >40)
LDL CHOLESTEROL (CALC): 101 mg/dL — AB
NON-HDL CHOLESTEROL (CALC): 138 mg/dL — AB (ref <130)
TRIGLYCERIDES: 260 mg/dL — AB (ref <150)
Total CHOL/HDL Ratio: 5.5 (calc) — ABNORMAL HIGH (ref <5.0)

## 2017-11-18 LAB — URINALYSIS, ROUTINE W REFLEX MICROSCOPIC
Bilirubin Urine: NEGATIVE
GLUCOSE, UA: NEGATIVE
HGB URINE DIPSTICK: NEGATIVE
KETONES UR: NEGATIVE
LEUKOCYTES UA: NEGATIVE
NITRITE: NEGATIVE
PH: 7.5 (ref 5.0–8.0)
PROTEIN: NEGATIVE
Specific Gravity, Urine: 1.018 (ref 1.001–1.03)

## 2017-11-18 LAB — HEPATIC FUNCTION PANEL
AG RATIO: 1.8 (calc) (ref 1.0–2.5)
ALBUMIN MSPROF: 4.6 g/dL (ref 3.6–5.1)
ALKALINE PHOSPHATASE (APISO): 68 U/L (ref 40–115)
ALT: 19 U/L (ref 9–46)
AST: 22 U/L (ref 10–40)
Bilirubin, Direct: 0.2 mg/dL (ref 0.0–0.2)
GLOBULIN: 2.5 g/dL (ref 1.9–3.7)
Indirect Bilirubin: 1.1 mg/dL (calc) (ref 0.2–1.2)
TOTAL PROTEIN: 7.1 g/dL (ref 6.1–8.1)
Total Bilirubin: 1.3 mg/dL — ABNORMAL HIGH (ref 0.2–1.2)

## 2017-11-18 LAB — MICROALBUMIN / CREATININE URINE RATIO
Creatinine, Urine: 168 mg/dL (ref 20–320)
Microalb Creat Ratio: 5 mcg/mg creat (ref <30)
Microalb, Ur: 0.8 mg/dL

## 2017-11-18 LAB — BASIC METABOLIC PANEL WITH GFR
BUN: 11 mg/dL (ref 7–25)
CALCIUM: 9.7 mg/dL (ref 8.6–10.3)
CO2: 31 mmol/L (ref 20–32)
CREATININE: 0.92 mg/dL (ref 0.60–1.35)
Chloride: 99 mmol/L (ref 98–110)
GFR, EST NON AFRICAN AMERICAN: 99 mL/min/{1.73_m2} (ref >=60)
GFR, Est African American: 115 mL/min/{1.73_m2} (ref >=60)
GLUCOSE: 82 mg/dL (ref 65–99)
Potassium: 3.8 mmol/L (ref 3.5–5.3)
SODIUM: 138 mmol/L (ref 135–146)

## 2017-11-18 LAB — VITAMIN D 25 HYDROXY (VIT D DEFICIENCY, FRACTURES): VIT D 25 HYDROXY: 34 ng/mL (ref 30–100)

## 2017-11-18 LAB — TESTOSTERONE: Testosterone: 230 ng/dL — ABNORMAL LOW (ref 250–827)

## 2017-11-18 LAB — MAGNESIUM: MAGNESIUM: 1.9 mg/dL (ref 1.5–2.5)

## 2017-11-18 LAB — TSH: TSH: 1.78 mIU/L (ref 0.40–4.50)

## 2017-11-22 ENCOUNTER — Other Ambulatory Visit: Payer: Self-pay | Admitting: Internal Medicine

## 2017-11-22 DIAGNOSIS — I10 Essential (primary) hypertension: Secondary | ICD-10-CM

## 2018-04-13 ENCOUNTER — Other Ambulatory Visit: Payer: Self-pay | Admitting: Internal Medicine

## 2018-04-13 DIAGNOSIS — M7711 Lateral epicondylitis, right elbow: Secondary | ICD-10-CM

## 2018-09-12 ENCOUNTER — Encounter: Payer: Self-pay | Admitting: Physician Assistant

## 2018-11-30 ENCOUNTER — Ambulatory Visit: Payer: 59 | Admitting: Physician Assistant

## 2018-11-30 ENCOUNTER — Encounter: Payer: Self-pay | Admitting: Physician Assistant

## 2018-11-30 VITALS — BP 132/80 | HR 62 | Temp 97.4°F | Ht 69.0 in | Wt 220.4 lb

## 2018-11-30 DIAGNOSIS — F528 Other sexual dysfunction not due to a substance or known physiological condition: Secondary | ICD-10-CM

## 2018-11-30 DIAGNOSIS — I10 Essential (primary) hypertension: Secondary | ICD-10-CM

## 2018-11-30 DIAGNOSIS — Z720 Tobacco use: Secondary | ICD-10-CM

## 2018-11-30 DIAGNOSIS — Z79899 Other long term (current) drug therapy: Secondary | ICD-10-CM

## 2018-11-30 DIAGNOSIS — E559 Vitamin D deficiency, unspecified: Secondary | ICD-10-CM

## 2018-11-30 DIAGNOSIS — E785 Hyperlipidemia, unspecified: Secondary | ICD-10-CM

## 2018-11-30 DIAGNOSIS — Z136 Encounter for screening for cardiovascular disorders: Secondary | ICD-10-CM | POA: Diagnosis not present

## 2018-11-30 DIAGNOSIS — E291 Testicular hypofunction: Secondary | ICD-10-CM

## 2018-11-30 DIAGNOSIS — F411 Generalized anxiety disorder: Secondary | ICD-10-CM

## 2018-11-30 DIAGNOSIS — Z0000 Encounter for general adult medical examination without abnormal findings: Secondary | ICD-10-CM | POA: Diagnosis not present

## 2018-11-30 DIAGNOSIS — E8881 Metabolic syndrome: Secondary | ICD-10-CM

## 2018-11-30 NOTE — Patient Instructions (Addendum)
VITAMIN D IS IMPORTANT  Vitamin D goal is between 60-80  Please make sure that you are taking your Vitamin D as directed.   It is very important as a natural anti-inflammatory   helping hair, skin, and nails, as well as reducing stroke and heart attack risk.   It helps your bones and helps with mood.  We want you on at least 5000 IU daily  It also decreases numerous cancer risks so please take it as directed.   Low Vit D is associated with a 200-300% higher risk for CANCER   and 200-300% higher risk for HEART   ATTACK  &  STROKE.    .....................................Marland Kitchen  It is also associated with higher death rate at younger ages,   autoimmune diseases like Rheumatoid arthritis, Lupus, Multiple Sclerosis.     Also many other serious conditions, like depression, Alzheimer's  Dementia, infertility, muscle aches, fatigue, fibromyalgia - just to name a few.  +++++++++++++++++++  Can get liquid vitamin D from [place]  OR here in [place] at  [facility] alternatives [address] [place], [place] [address] Or you can try earth fare   Can do zinc 40-50 mg a day Can try shake that is whey protein, almond milk, avocado oil, and spinach and strawberries in the morning  9 Ways to Naturally Increase Testosterone Levels  1.   Lose Weight If you're overweight, shedding the excess pounds may increase your testosterone levels, according to research presented at the Endocrine Society's 2012 meeting. Overweight men are more likely to have low testosterone levels to begin with, so this is an important trick to increase your body's testosterone production when you need it most.  2.   High-Intensity Exercise like Peak Fitness  Short intense exercise has a proven positive effect on increasing testosterone levels and preventing its decline. That's unlike aerobics or prolonged moderate exercise, which have shown to have negative or no effect on testosterone levels. Having a whey protein meal  after exercise can further enhance the satiety/testosterone-boosting impact (hunger hormones cause the opposite effect on your testosterone and libido). Here's a summary of what a typical high-intensity Peak Fitness routine might look like: " Warm up for three minutes  " Exercise as hard and fast as you can for 30 seconds. You should feel like you couldn't possibly go on another few seconds  " Recover at a slow to moderate pace for 90 seconds  " Repeat the high intensity exercise and recovery 7 more times .  3.   Consume Plenty of Zinc The mineral zinc is important for testosterone production, and supplementing your diet for as little as six weeks has been shown to cause a marked improvement in testosterone among men with low levels.1 Likewise, research has shown that restricting dietary sources of zinc leads to a significant decrease in testosterone, while zinc supplementation increases it2 -- and even protects men from exercised-induced reductions in testosterone levels.3 It's estimated that up to 45 percent of adults over the age of 60 may have lower than recommended zinc intakes; even when dietary supplements were added in, an estimated 20-25 percent of older adults still had inadequate zinc intakes, according to a [employer] and Nutrition Examination Survey.4 Your diet is the best source of zinc; along with protein-rich foods like meats and fish, other good dietary sources of zinc include raw milk, raw cheese, beans, and yogurt or kefir made from raw milk. It can be difficult to obtain enough dietary zinc if you're a vegetarian, and  also for meat-eaters as well, largely because of conventional farming methods that rely heavily on chemical fertilizers and pesticides. These chemicals deplete the soil of nutrients ... nutrients like zinc that must be absorbed by plants in order to be passed on to you. In many cases, you may further deplete the nutrients in your food by the way you prepare it. For  most food, cooking it will drastically reduce its levels of nutrients like zinc ... particularly over-cooking, which many people do. If you decide to use a zinc supplement, stick to a dosage of less than 40 mg a day, as this is the recommended adult upper limit. Taking too much zinc can interfere with your body's ability to absorb other minerals, especially copper, and may cause nausea as a side effect.  4.   Strength Training In addition to Peak Fitness, strength training is also known to boost testosterone levels, provided you are doing so intensely enough. When strength training to boost testosterone, you'll want to increase the weight and lower your number of reps, and then focus on exercises that work a large number of muscles, such as dead lifts or squats.  You can "turbo-charge" your weight training by going slower. By slowing down your movement, you're actually turning it into a high-intensity exercise. Super Slow movement allows your muscle, at the microscopic level, to access the maximum number of cross-bridges between the protein filaments that produce movement in the muscle.   5.   Optimize Your Vitamin D Levels Vitamin D, a steroid hormone, is essential for the healthy development of the nucleus of the sperm cell, and helps maintain semen quality and sperm count. Vitamin D also increases levels of testosterone, which may boost libido. In one study, overweight men who were given vitamin D supplements had a significant increase in testosterone levels after one year.5   6.   Reduce Stress When you're under a lot of stress, your body releases high levels of the stress hormone cortisol. This hormone actually blocks the effects of testosterone,6 presumably because, from a biological standpoint, testosterone-associated behaviors (mating, competing, aggression) may have lowered your chances of survival in an emergency (hence, the "fight or flight" response is dominant, courtesy of cortisol).  7.    Limit or Eliminate Sugar from Your Diet Testosterone levels decrease after you eat sugar, which is likely because the sugar leads to a high insulin level, another factor leading to low testosterone.7 Based on USDA estimates, the average American consumes 12 teaspoons of sugar a day, which equates to about TWO TONS of sugar during a lifetime.  8.   Eat Healthy Fats By healthy, this means not only mon- and polyunsaturated fats, like that found in avocadoes and nuts, but also saturated, as these are essential for building testosterone. Research shows that a diet with less than 40 percent of energy as fat (and that mainly from animal sources, i.e. saturated) lead to a decrease in testosterone levels.8 My personal diet is about 60-70 percent healthy fat, and other experts agree that the ideal diet includes somewhere between 50-70 percent fat.  It's important to understand that your body requires saturated fats from animal and vegetable sources (such as meat, dairy, certain oils, and tropical plants like coconut) for optimal functioning, and if you neglect this important food group in favor of sugar, grains and other starchy carbs, your health and weight are almost guaranteed to suffer. Examples of healthy fats you can eat more of to give your testosterone levels a boost  include: Olives and Olive oil  Coconuts and coconut oil Butter made from raw grass-fed organic milk Raw nuts, such as, almonds or pecans Organic pastured egg yolks Avocados Grass-fed meats Palm oil Unheated organic nut oils   9.   Boost Your Intake of Branch Chain Amino Acids (BCAA) from Foods Like Whey Protein Research suggests that BCAAs result in higher testosterone levels, particularly when taken along with resistance training.9 While BCAAs are available in supplement form, you'll find the highest concentrations of BCAAs like leucine in dairy products - especially quality cheeses and whey protein. Even when getting leucine from your  natural food supply, it's often wasted or used as a building block instead of an anabolic agent. So to create the correct anabolic environment, you need to boost leucine consumption way beyond mere maintenance levels. That said, keep in mind that using leucine as a free form amino acid can be highly counterproductive as when free form amino acids are artificially administrated, they rapidly enter your circulation while disrupting insulin function, and impairing your body's glycemic control. Food-based leucine is really the ideal form that can benefit your muscles without side effects.      When it comes to diets, agreement about the perfect plan isn't easy to find, even among the experts. Experts at the [employer] developed an idea known as the Healthy Eating Plate. Just imagine a plate divided into logical, healthy portions.  The emphasis is on diet quality:  Load up on vegetables and fruits - one-half of your plate: Aim for color and variety, and remember that potatoes don't count.  Go for whole grains - one-quarter of your plate: Whole wheat, barley, wheat berries, quinoa, oats, brown rice, and foods made with them. If you want pasta, go with whole wheat pasta.  Protein power - one-quarter of your plate: Fish, chicken, beans, and nuts are all healthy, versatile protein sources. Limit red meat.  The diet, however, does go beyond the plate, offering a few other suggestions.  Use healthy plant oils, such as olive, canola, soy, corn, sunflower and peanut. Check the labels, and avoid partially hydrogenated oil, which have unhealthy trans fats.  If you're thirsty, drink water. Coffee and tea are good in moderation, but skip sugary drinks and limit milk and dairy products to one or two daily servings.  The type of carbohydrate in the diet is more important than the amount. Some sources of carbohydrates, such as vegetables, fruits, whole grains, and beans-are healthier than  others.  Finally, stay active.

## 2018-11-30 NOTE — Progress Notes (Signed)
Complete Physical  Assessment and Plan: Essential hypertension, benign - CBC with Differential/Platelet - BASIC METABOLIC PANEL WITH GFR - Hepatic function panel - TSH - Urinalysis, Routine w reflex microscopic ([facility]) - Microalbumin / creatinine urine ratio  Hyperlipidemia -continue medications, check lipids, decrease fatty foods, increase activity.  - Lipid panel   Hypogonadism in male Declines check at this time, weight loss advised  Vitamin D deficiency - Vit D  25 hydroxy (rtn osteoporosis monitoring)  Medication management - Magnesium  Tobacco chew use Advised to stop chewing, see dentist, not interesting  ERECTILE DYSFUNCTION States this has improved  Anxiety state continue medications, stress management techniques discussed, increase water, good sleep hygiene discussed, increase exercise, and increase veggies.   Encounter for general adult medical examination with abnormal findings  Obesity Obesity with co morbidities- long discussion about weight loss, diet, and exercise  Discussed med's effects and SE's. Screening labs and tests as requested with regular follow-up as recommended.  HPI 48 y.o. male  presents for a complete physical.  He was taking mobic and occ an ibuprofen, he stopped all his meds but bASA and lisinopril due to this and he is doing better.   He states he is still chewing and states that he thinks he will "not get cancer" has done since 48 years old and states since I don't have it now I won't get it.   His blood pressure has been controlled at home, today their BP is BP: 132/80 He does not workout, he works on well pumps. He denies chest pain, shortness of breath, dizziness.   He is not on cholesterol medication, but suppose to be on fenofibrate.  His cholesterol is not at goal. Not fasting today. Drinking 1 pepsi in the morning. The cholesterol last visit was:  Lab Results  Component Value Date   CHOL 169 11/17/2017   HDL 31 (L)  11/17/2017   LDLCALC 101 (H) 11/17/2017   TRIG 260 (H) 11/17/2017   CHOLHDL 5.5 (H) 11/17/2017   He has been working on diet and exercise for prediabetes and has decreased drinking, and denies paresthesia of the feet and polydipsia. Last A1C in the office was:  Lab Results  Component Value Date   HGBA1C 5.4 08/12/2015   Last testosterone was low but he is not on treatment.  Lab Results  Component Value Date   TESTOSTERONE 230 (L) 11/17/2017   Patient is on Vitamin D supplement, takes sporacidly. Lab Results  Component Value Date   VD25OH 34 11/17/2017   BMI is Body mass index is 32.55 kg/m., he is working on diet and exercise. He stopped drinking any alcohol.  Wt Readings from Last 3 Encounters:  11/30/18 220 lb 6.4 oz (100 kg)  11/17/17 217 lb (98.4 kg)  08/17/16 209 lb (94.8 kg)    Current Medications:  Current Outpatient Medications on File Prior to Visit  Medication Sig Dispense Refill  . celecoxib (CELEBREX) 200 MG capsule TAKE ONE CAPSULE BY MOUTH TWICE DAILY 60 capsule 2  . Cholecalciferol (VITAMIN D-3) 5000 UNITS TABS Take by mouth 2 (two) times daily.    . citalopram (CELEXA) 40 MG tablet Take 1 tablet (40 mg total) by mouth daily. 90 tablet 0  . fenofibrate 160 MG tablet Take 1 tablet (160 mg total) by mouth daily. 90 tablet 0  . lisinopril (PRINIVIL,ZESTRIL) 20 MG tablet TAKE 1 TABLET BY MOUTH ONCE DAILY FOR BLOOD PRESSURE 90 tablet 1  . meloxicam (MOBIC) 15 MG tablet TAKE  1 TABLET BY MOUTH ONCE DAILY 30 tablet 0   No current facility-administered medications on file prior to visit.    Health Maintenance:  Immunization History  Administered Date(s) Administered  . Tdap 10/22/2013   Tetanus: 2014 Pneumovax: N/A Prevnar 13: due 65 Flu vaccine:declines Zostavax:N/A DEXA:N/A Colonoscopy: DUE age 48, no family history EGD: N/A  Medical History:  Past Medical History:  Diagnosis Date  . Dyslipidemia   . HTN (hypertension)    Allergies No Known  Allergies  SURGICAL HISTORY He  has a past surgical history that includes Vasectomy and Shoulder arthroscopy w/ rotator cuff repair (Right, 04/2015). FAMILY HISTORY His family history includes Heart attack in his father; Heart disease in his father. SOCIAL HISTORY He  reports that he has never smoked. His smokeless tobacco use includes chew. He reports that he does not drink alcohol or use drugs.   Review of Systems  Constitutional: Negative.   HENT: Negative.   Eyes: Negative.   Respiratory: Negative.   Cardiovascular: Negative.   Gastrointestinal: Negative.   Genitourinary: Negative for dysuria, flank pain, frequency, hematuria and urgency.  Musculoskeletal: Negative for back pain, falls, joint pain, myalgias (bilateral heel pain, worse in the AM) and neck pain.  Skin: Negative.   Neurological: Negative.   Psychiatric/Behavioral: Negative.     Physical Exam: Estimated body mass index is 32.55 kg/m as calculated from the following:   Height as of this encounter: 5\' 9"  (1.753 m).   Weight as of this encounter: 220 lb 6.4 oz (100 kg). BP 132/80   Pulse 62   Temp (!) 97.4 F (36.3 C)   Ht 5\' 9"  (1.753 m)   Wt 220 lb 6.4 oz (100 kg)   SpO2 96%   BMI 32.55 kg/m  General Appearance: Well nourished, in no apparent distress. Eyes: PERRLA, EOMs, conjunctiva no swelling or erythema, normal fundi and vessels. Sinuses: No Frontal/maxillary tenderness ENT/Mouth: Ext aud canals clear, normal light reflex with TMs without erythema, bulging. Good dentition. No erythema, swelling, or exudate on post pharynx. Tonsils not swollen or erythematous. Hearing normal.  Neck: Supple, thyroid normal. No bruits Respiratory: Respiratory effort normal, BS equal bilaterally without rales, rhonchi, wheezing or stridor. Cardio: RRR without murmurs, rubs or gallops. Brisk peripheral pulses without edema.  Chest: symmetric, with normal excursions and percussion. Abdomen: Soft, +BS. Non tender, no  guarding, rebound, hernias, masses, or organomegaly. .  Lymphatics: Non tender without lymphadenopathy.  Genitourinary: defer Musculoskeletal: Full ROM all peripheral extremities,5/5 strength, and normal gait.  Skin: Warm, dry without rashes, lesions, ecchymosis.  Neuro: Cranial nerves intact, reflexes equal bilaterally. Normal muscle tone, no cerebellar symptoms. Sensation intact.  Psych: Awake and oriented X 3, normal affect, Insight and Judgment appropriate.   EKG: defer next year  Quentin MullingAmanda Lamaya Hyneman 10:06 AM

## 2018-12-01 LAB — CBC WITH DIFFERENTIAL/PLATELET
ABSOLUTE MONOCYTES: 561 {cells}/uL (ref 200–950)
Basophils Absolute: 83 cells/uL (ref 0–200)
Basophils Relative: 1.4 %
EOS ABS: 112 {cells}/uL (ref 15–500)
Eosinophils Relative: 1.9 %
HEMATOCRIT: 48.6 % (ref 38.5–50.0)
Hemoglobin: 16.6 g/dL (ref 13.2–17.1)
LYMPHS ABS: 1947 {cells}/uL (ref 850–3900)
MCH: 30.9 pg (ref 27.0–33.0)
MCHC: 34.2 g/dL (ref 32.0–36.0)
MCV: 90.5 fL (ref 80.0–100.0)
MPV: 10.8 fL (ref 7.5–12.5)
Monocytes Relative: 9.5 %
NEUTROS PCT: 54.2 %
Neutro Abs: 3198 cells/uL (ref 1500–7800)
Platelets: 261 10*3/uL (ref 140–400)
RBC: 5.37 10*6/uL (ref 4.20–5.80)
RDW: 12.9 % (ref 11.0–15.0)
TOTAL LYMPHOCYTE: 33 %
WBC: 5.9 10*3/uL (ref 3.8–10.8)

## 2018-12-01 LAB — HEMOGLOBIN A1C
Hgb A1c MFr Bld: 5.3 % of total Hgb (ref <5.7)
Mean Plasma Glucose: 105 (calc)
eAG (mmol/L): 5.8 (calc)

## 2018-12-01 LAB — URINALYSIS, ROUTINE W REFLEX MICROSCOPIC
BILIRUBIN URINE: NEGATIVE
Glucose, UA: NEGATIVE
Hgb urine dipstick: NEGATIVE
KETONES UR: NEGATIVE
Leukocytes, UA: NEGATIVE
Nitrite: NEGATIVE
Protein, ur: NEGATIVE
Specific Gravity, Urine: 1.027 (ref 1.001–1.03)
pH: 6.5 (ref 5.0–8.0)

## 2018-12-01 LAB — LIPID PANEL
CHOLESTEROL: 174 mg/dL (ref <200)
HDL: 33 mg/dL — ABNORMAL LOW (ref >40)
LDL Cholesterol (Calc): 118 mg/dL (calc) — ABNORMAL HIGH
Non-HDL Cholesterol (Calc): 141 mg/dL (calc) — ABNORMAL HIGH (ref <130)
Total CHOL/HDL Ratio: 5.3 (calc) — ABNORMAL HIGH (ref <5.0)
Triglycerides: 118 mg/dL (ref <150)

## 2018-12-01 LAB — COMPLETE METABOLIC PANEL WITH GFR
AG RATIO: 1.9 (calc) (ref 1.0–2.5)
ALBUMIN MSPROF: 4.8 g/dL (ref 3.6–5.1)
ALKALINE PHOSPHATASE (APISO): 65 U/L (ref 40–115)
ALT: 23 U/L (ref 9–46)
AST: 25 U/L (ref 10–40)
BILIRUBIN TOTAL: 1.7 mg/dL — AB (ref 0.2–1.2)
BUN: 11 mg/dL (ref 7–25)
CO2: 30 mmol/L (ref 20–32)
CREATININE: 0.97 mg/dL (ref 0.60–1.35)
Calcium: 9.6 mg/dL (ref 8.6–10.3)
Chloride: 102 mmol/L (ref 98–110)
GFR, Est African American: 107 mL/min/{1.73_m2} (ref >=60)
GFR, Est Non African American: 93 mL/min/{1.73_m2} (ref >=60)
GLOBULIN: 2.5 g/dL (ref 1.9–3.7)
Glucose, Bld: 90 mg/dL (ref 65–99)
POTASSIUM: 3.7 mmol/L (ref 3.5–5.3)
SODIUM: 141 mmol/L (ref 135–146)
Total Protein: 7.3 g/dL (ref 6.1–8.1)

## 2018-12-01 LAB — VITAMIN D 25 HYDROXY (VIT D DEFICIENCY, FRACTURES): Vit D, 25-Hydroxy: 23 ng/mL — ABNORMAL LOW (ref 30–100)

## 2018-12-01 LAB — MICROALBUMIN / CREATININE URINE RATIO
Creatinine, Urine: 264 mg/dL (ref 20–320)
Microalb Creat Ratio: 3 mcg/mg creat (ref <30)
Microalb, Ur: 0.8 mg/dL

## 2018-12-01 LAB — MAGNESIUM: Magnesium: 1.9 mg/dL (ref 1.5–2.5)

## 2018-12-01 LAB — TSH: TSH: 1.7 mIU/L (ref 0.40–4.50)

## 2018-12-31 ENCOUNTER — Other Ambulatory Visit: Payer: Self-pay | Admitting: Internal Medicine

## 2018-12-31 DIAGNOSIS — M7711 Lateral epicondylitis, right elbow: Secondary | ICD-10-CM

## 2019-02-07 ENCOUNTER — Other Ambulatory Visit: Payer: Self-pay | Admitting: Internal Medicine

## 2019-02-07 ENCOUNTER — Other Ambulatory Visit: Payer: Self-pay | Admitting: Adult Health

## 2019-02-07 DIAGNOSIS — M7711 Lateral epicondylitis, right elbow: Secondary | ICD-10-CM

## 2019-02-07 DIAGNOSIS — I10 Essential (primary) hypertension: Secondary | ICD-10-CM

## 2019-03-13 ENCOUNTER — Other Ambulatory Visit: Payer: Self-pay | Admitting: Physician Assistant

## 2019-03-13 DIAGNOSIS — M7711 Lateral epicondylitis, right elbow: Secondary | ICD-10-CM

## 2019-05-01 ENCOUNTER — Other Ambulatory Visit: Payer: Self-pay | Admitting: Physician Assistant

## 2019-05-01 DIAGNOSIS — M7711 Lateral epicondylitis, right elbow: Secondary | ICD-10-CM

## 2019-06-13 ENCOUNTER — Other Ambulatory Visit: Payer: Self-pay

## 2019-06-13 DIAGNOSIS — I10 Essential (primary) hypertension: Secondary | ICD-10-CM

## 2019-06-13 MED ORDER — LISINOPRIL 20 MG PO TABS: ORAL_TABLET | ORAL | 0 refills | Status: DC

## 2019-06-13 NOTE — Progress Notes (Signed)
Refill request for Lisinopril be sent to new pharmacy.

## 2019-09-10 ENCOUNTER — Other Ambulatory Visit: Payer: Self-pay | Admitting: Internal Medicine

## 2019-09-10 DIAGNOSIS — M7711 Lateral epicondylitis, right elbow: Secondary | ICD-10-CM

## 2019-09-10 MED ORDER — MELOXICAM 15 MG PO TABS: ORAL_TABLET | ORAL | 0 refills | Status: DC

## 2019-09-12 ENCOUNTER — Other Ambulatory Visit: Payer: Self-pay | Admitting: Internal Medicine

## 2019-09-12 DIAGNOSIS — M7711 Lateral epicondylitis, right elbow: Secondary | ICD-10-CM

## 2019-09-19 DIAGNOSIS — E669 Obesity, unspecified: Secondary | ICD-10-CM | POA: Insufficient documentation

## 2019-09-19 NOTE — Progress Notes (Deleted)
FOLLOW UP  Assessment and Plan:   Hypertension Well controlled with current medications  Monitor blood pressure at home; patient to call if consistently greater than 130/80 Continue DASH diet.   Reminder to go to the ER if any CP, SOB, nausea, dizziness, severe HA, changes vision/speech, left arm numbness and tingling and jaw pain.  Cholesterol Currently at goal for trigs; continue fenofibrate Mild LDL elevations managed by lifestyle *** Continue low cholesterol diet and exercise.  Check lipid panel.   Abnormal glucose Recent A1Cs at goal Discussed diet/exercise, weight management  Defer A1C; check CMP  Obesity with co morbidities Long discussion about weight loss, diet, and exercise Recommended diet heavy in fruits and veggies and low in animal meats, cheeses, and dairy products, appropriate calorie intake Discussed ideal weight for height (below ***) and initial weight goal (***) Patient will work on *** Will follow up in 3 months  Vitamin D Def Below goal at last visit;  continue to recommend supplementation for goal of 60-100 *** Vit D level  Chewing tobacco ***  Anxiety ***  Continue diet and meds as discussed. Further disposition pending results of labs. Discussed med's effects and SE's.   Over 30 minutes of exam, counseling, chart review, and critical decision making was performed.   Future Appointments  Date Time Provider [place]  09/24/2019  9:00 AM Liane Comber, NP [facility] None  12/04/2019 10:00 AM Vicie Mutters, PA-C [facility] None    ----------------------------------------------------------------------------------------------------------------------  HPI 48 y.o. male  presents for 6 month follow up on hypertension, cholesterol, weight and vitamin D deficiency.   Meloxicam, celebrex ***  He states he is still chewing and states that he thinks he will "not get cancer" has done since 48 years old and states since I don't have it now I  won't get it.   Hx of anxiety on celexa ***  BMI is There is no height or weight on file to calculate BMI., he {HAS HAS FBP:10258} been working on diet and exercise. Wt Readings from Last 3 Encounters:  11/30/18 220 lb 6.4 oz (100 kg)  11/17/17 217 lb (98.4 kg)  08/17/16 209 lb (94.8 kg)   His blood pressure {HAS HAS NOT:18834} been controlled at home, today their BP is    He {DOES_DOES NID:78242} workout. He denies chest pain, shortness of breath, dizziness.   He is on cholesterol medication Fenofibrate and denies myalgias. His cholesterol is not at goal. The cholesterol last visit was:   Lab Results  Component Value Date   CHOL 174 11/30/2018   HDL 33 (L) 11/30/2018   LDLCALC 118 (H) 11/30/2018   TRIG 118 11/30/2018   CHOLHDL 5.3 (H) 11/30/2018    He {Has/has not:18111} been working on diet and exercise for glucose management, and denies {Symptoms; diabetes w/o none:19199}. Last A1C in the office was:  Lab Results  Component Value Date   HGBA1C 5.3 11/30/2018   Patient is on Vitamin D supplement.   Lab Results  Component Value Date   VD25OH 23 (L) 11/30/2018        Current Medications:  Current Outpatient Medications on File Prior to Visit  Medication Sig  . celecoxib (CELEBREX) 200 MG capsule TAKE ONE CAPSULE BY MOUTH TWICE DAILY  . Cholecalciferol (VITAMIN D-3) 5000 UNITS TABS Take by mouth 2 (two) times daily.  . citalopram (CELEXA) 40 MG tablet Take 1 tablet (40 mg total) by mouth daily.  . fenofibrate 160 MG tablet Take 1 tablet (160 mg total) by mouth  daily.  . lisinopril (ZESTRIL) 20 MG tablet TAKE 1 TABLET ONCE DAILY FOR BLOOD PRESSURE  . meloxicam (MOBIC) 15 MG tablet TAKE 1/2 TO 1 TABLET ONCE A DAY WITH FOOD FOR PAIN OR INFLAMMATION   No current facility-administered medications on file prior to visit.      Allergies: No Known Allergies   Medical History:  Past Medical History:  Diagnosis Date  . Dyslipidemia   . HTN (hypertension)    Family  history- Reviewed and unchanged Social history- Reviewed and unchanged   Review of Systems:  Review of Systems  Constitutional: Negative for malaise/fatigue and weight loss.  HENT: Negative for hearing loss and tinnitus.   Eyes: Negative for blurred vision and double vision.  Respiratory: Negative for cough, shortness of breath and wheezing.   Cardiovascular: Negative for chest pain, palpitations, orthopnea, claudication and leg swelling.  Gastrointestinal: Negative for abdominal pain, blood in stool, constipation, diarrhea, heartburn, melena, nausea and vomiting.  Genitourinary: Negative.   Musculoskeletal: Negative for joint pain and myalgias.  Skin: Negative for rash.  Neurological: Negative for dizziness, tingling, sensory change, weakness and headaches.  Endo/Heme/Allergies: Negative for polydipsia.  Psychiatric/Behavioral: Negative.   All other systems reviewed and are negative.     Physical Exam: There were no vitals taken for this visit. Wt Readings from Last 3 Encounters:  11/30/18 220 lb 6.4 oz (100 kg)  11/17/17 217 lb (98.4 kg)  08/17/16 209 lb (94.8 kg)   General Appearance: Well nourished, in no apparent distress. Eyes: PERRLA, EOMs, conjunctiva no swelling or erythema Sinuses: No Frontal/maxillary tenderness ENT/Mouth: Ext aud canals clear, TMs without erythema, bulging. No erythema, swelling, or exudate on post pharynx.  Tonsils not swollen or erythematous. Hearing normal.  Neck: Supple, thyroid normal.  Respiratory: Respiratory effort normal, BS equal bilaterally without rales, rhonchi, wheezing or stridor.  Cardio: RRR with no MRGs. Brisk peripheral pulses without edema.  Abdomen: Soft, + BS.  Non tender, no guarding, rebound, hernias, masses. Lymphatics: Non tender without lymphadenopathy.  Musculoskeletal: Full ROM, 5/5 strength, {PSY - GAIT AND STATION:22860} gait Skin: Warm, dry without rashes, lesions, ecchymosis.  Neuro: Cranial nerves intact. No  cerebellar symptoms.  Psych: Awake and oriented X 3, normal affect, Insight and Judgment appropriate.    Dan Maker, NP 2:03 PM [facility]

## 2019-09-24 ENCOUNTER — Encounter: Payer: Self-pay | Admitting: Internal Medicine

## 2019-09-24 ENCOUNTER — Ambulatory Visit: Payer: 59 | Admitting: Adult Health

## 2019-09-24 DIAGNOSIS — I10 Essential (primary) hypertension: Secondary | ICD-10-CM

## 2019-09-24 NOTE — Progress Notes (Addendum)
History of Present Illness:      This very nice 48 y.o. MWM presents for 3 month follow up with HTN, HLD, Pre-Diabetes and Vitamin D Deficiency.        Today , patient relates ongoing complaints with  poor libido and erectile dysfunction. Does have hx/o Low Testosterone and has been on testosterone replacement w/o perceived benefit. He relates he sought help at some clinic  in [facility] ([facility]). He indicates that he desires a 2sd opinion.       Patient is treated for HTN (2010) & BP has been controlled at home. Today's BP is at goal - 130/86. (+) FHx  Heart Dz with father having MI in his 56's and died age 74. Patient has had no complaints of any cardiac type chest pain, palpitations, dyspnea / orthopnea / PND, dizziness, claudication, or dependent edema.      Hyperlipidemia is not controlled with diet. Patient admits poor diet compliance. Last Lipids were not at goal:  Lab Results  Component Value Date   CHOL 174 11/30/2018   HDL 33 (L) 11/30/2018   LDLCALC 118 (H) 11/30/2018   TRIG 118 11/30/2018   CHOLHDL 5.3 (H) 11/30/2018        Also, the patient is moderately obese (BMI 32.5+) and is followed expectantly for glucose intolerance and has had no symptoms of reactive hypoglycemia, diabetic polys, paresthesias or visual blurring.  Last A1c was Normal & at goal:  Lab Results  Component Value Date   HGBA1C 5.3 11/30/2018       Further, the patient also has history of Vitamin D Deficiency ("29" / 2016) and supplements vitamin D without any suspected side-effects. Last vitamin D was still very low:  Lab Results  Component Value Date   VD25OH 23 (L) 11/30/2018    Current Outpatient Medications on File Prior to Visit  Medication Sig  . Cholecalciferol (VITAMIN D-3) 5000 UNITS TABS Take by mouth 2 (two) times daily.  Marland Kitchen lisinopril (ZESTRIL) 20 MG tablet TAKE 1 TABLET ONCE DAILY FOR BLOOD PRESSURE  . meloxicam (MOBIC) 15 MG tablet TAKE 1/2 TO 1  TABLET ONCE A DAY WITH FOOD FOR PAIN OR INFLAMMATION  . celecoxib (CELEBREX) 200 MG capsule TAKE ONE CAPSULE BY MOUTH TWICE DAILY (Patient not taking: Reported on 09/25/2019)  . citalopram (CELEXA) 40 MG tablet Take 1 tablet (40 mg total) by mouth daily. (Patient not taking: Reported on 09/25/2019)  . fenofibrate 160 MG tablet Take 1 tablet (160 mg total) by mouth daily. (Patient not taking: Reported on 09/25/2019)   No current facility-administered medications on file prior to visit.    No Known Allergies  PMHx:   Past Medical History:  Diagnosis Date  . Dyslipidemia   . HTN (hypertension)    Immunization History  Administered Date(s) Administered  . Tdap 10/22/2013   Past Surgical History:  Procedure Laterality Date  . SHOULDER ARTHROSCOPY W/ ROTATOR CUFF REPAIR Right 04/2015  . VASECTOMY     FHx:    Reviewed / unchanged  SHx:    Reviewed / unchanged   Systems Review:  Constitutional: Denies fever, chills, wt changes, headaches, insomnia, fatigue, night sweats, change in appetite. Eyes: Denies redness, blurred vision, diplopia, discharge, itchy, watery eyes.  ENT: Denies discharge, congestion, post nasal drip, epistaxis, sore throat, earache, hearing loss, dental pain, tinnitus, vertigo, sinus pain, snoring.  CV: Denies chest pain, palpitations, irregular heartbeat, syncope, dyspnea, diaphoresis, orthopnea, PND, claudication or  edema. Respiratory: denies cough, dyspnea, DOE, pleurisy, hoarseness, laryngitis, wheezing.  Gastrointestinal: Denies dysphagia, odynophagia, heartburn, reflux, water brash, abdominal pain or cramps, nausea, vomiting, bloating, diarrhea, constipation, hematemesis, melena, hematochezia  or hemorrhoids. Genitourinary: Denies dysuria, frequency, urgency, nocturia, hesitancy, discharge, hematuria or flank pain. Musculoskeletal: Denies arthralgias, myalgias, stiffness, jt. swelling, pain, limping or strain/sprain.  Skin: Denies pruritus, rash, hives, warts,  acne, eczema or change in skin lesion(s). Neuro: No weakness, tremor, incoordination, spasms, paresthesia or pain. Psychiatric: Denies confusion, memory loss or sensory loss. Endo: Denies change in weight, skin or hair change.  Heme/Lymph: No excessive bleeding, bruising or enlarged lymph nodes.  Physical Exam  BP 130/86   Pulse 76   Temp (!) 97 F (36.1 C)   Resp 16   Ht 5\' 9"  (1.753 m)   Wt 211 lb 9.6 oz (96 kg)   BMI 31.25 kg/m   Appears  Over nourished  and in no distress.  Eyes: PERRLA, EOMs, conjunctiva no swelling or erythema. Sinuses: No frontal/maxillary tenderness ENT/Mouth: EAC's clear, TM's nl w/o erythema, bulging. Nares clear w/o erythema, swelling, exudates. Oropharynx clear without erythema or exudates. Oral hygiene is good. Tongue normal, non obstructing. Hearing intact.  Neck: Supple. Thyroid not palpable. Car 2+/2+ without bruits, nodes or JVD. Chest: Respirations nl with BS clear & equal w/o rales, rhonchi, wheezing or stridor.  Cor: Heart sounds normal w/ regular rate and rhythm without sig. murmurs, gallops, clicks or rubs. Peripheral pulses normal and equal  without edema.  Abdomen: Soft, obese & bowel sounds normal. Non-tender w/o guarding, rebound, hernias, masses or organomegaly.  Lymphatics: Unremarkable.  Musculoskeletal: Full ROM all peripheral extremities, joint stability, 5/5 strength and normal gait.  Skin: Warm, dry without exposed rashes, lesions or ecchymosis apparent.  Neuro: Cranial nerves intact, reflexes equal bilaterally. Sensory-motor testing grossly intact. Tendon reflexes grossly intact.  Pysch: Alert & oriented x 3.  Insight and judgement nl & appropriate. No ideations.  Assessment and Plan:  1. Essential hypertension  - Continue medication, monitor blood pressure at home.  - Continue DASH diet.  Reminder to go to the ER if any CP,  SOB, nausea, dizziness, severe HA, changes vision/speech.  - CBC with Differential/Platelet -  COMPLETE METABOLIC PANEL WITH GFR - Magnesium - TSH  2. Hyperlipidemia, mixed  - Continue diet/meds, exercise,& lifestyle modifications.  - Continue monitor periodic cholesterol/liver & renal functions   - Lipid panel - TSH  3. Abnormal glucose  - Continue diet, exercise  - Lifestyle modifications.  - Monitor appropriate labs.  - Hemoglobin A1c - Insulin, random  4. Vitamin D deficiency  - Continue supplementation.  - VITAMIN D 25 Hydroxl  5. Insulin resistance  - Hemoglobin A1c - Insulin, random  6. Medication management  - CBC with Differential/Platelet - COMPLETE METABOLIC PANEL WITH GFR - Magnesium - Lipid panel - TSH - Hemoglobin A1c - Insulin, random - VITAMIN D 25 Hydroxyl         Discussed  regular exercise, BP monitoring, weight control to achieve/maintain BMI less than 25 and discussed med and SE's. Recommended labs to assess and monitor clinical status with further disposition pending results of labs.  I discussed the assessment and treatment plan with the patient. The patient was provided an opportunity to ask questions and all were answered. The patient agreed with the plan and demonstrated an understanding of the instructions.  I provided over 30 minutes of exam, counseling, chart review and  complex critical decision making.  Kirtland Bouchard,  MD   

## 2019-09-24 NOTE — Patient Instructions (Signed)

## 2019-09-25 ENCOUNTER — Other Ambulatory Visit: Payer: Self-pay

## 2019-09-25 ENCOUNTER — Encounter: Payer: Self-pay | Admitting: Internal Medicine

## 2019-09-25 ENCOUNTER — Ambulatory Visit: Payer: 59 | Admitting: Internal Medicine

## 2019-09-25 VITALS — BP 130/86 | HR 76 | Temp 97.0°F | Resp 16 | Ht 69.0 in | Wt 211.6 lb

## 2019-09-25 DIAGNOSIS — I10 Essential (primary) hypertension: Secondary | ICD-10-CM

## 2019-09-25 DIAGNOSIS — E8881 Metabolic syndrome: Secondary | ICD-10-CM

## 2019-09-25 DIAGNOSIS — E559 Vitamin D deficiency, unspecified: Secondary | ICD-10-CM | POA: Diagnosis not present

## 2019-09-25 DIAGNOSIS — Z79899 Other long term (current) drug therapy: Secondary | ICD-10-CM

## 2019-09-25 DIAGNOSIS — F528 Other sexual dysfunction not due to a substance or known physiological condition: Secondary | ICD-10-CM

## 2019-09-25 DIAGNOSIS — E782 Mixed hyperlipidemia: Secondary | ICD-10-CM | POA: Diagnosis not present

## 2019-09-25 DIAGNOSIS — R7309 Other abnormal glucose: Secondary | ICD-10-CM | POA: Diagnosis not present

## 2019-09-25 DIAGNOSIS — E349 Endocrine disorder, unspecified: Secondary | ICD-10-CM

## 2019-09-25 NOTE — Addendum Note (Signed)
Addended by: Unk Pinto on: 09/25/2019 11:25 PM   Modules accepted: Orders

## 2019-09-26 ENCOUNTER — Ambulatory Visit: Payer: 59 | Admitting: Internal Medicine

## 2019-09-26 LAB — COMPLETE METABOLIC PANEL WITH GFR
AG Ratio: 1.8 (calc) (ref 1.0–2.5)
ALT: 15 U/L (ref 9–46)
AST: 17 U/L (ref 10–40)
Albumin: 4.5 g/dL (ref 3.6–5.1)
Alkaline phosphatase (APISO): 63 U/L (ref 36–130)
BUN: 13 mg/dL (ref 7–25)
CO2: 28 mmol/L (ref 20–32)
Calcium: 9.5 mg/dL (ref 8.6–10.3)
Chloride: 101 mmol/L (ref 98–110)
Creat: 0.93 mg/dL (ref 0.60–1.35)
GFR, Est African American: 113 mL/min/{1.73_m2} (ref >=60)
GFR, Est Non African American: 97 mL/min/{1.73_m2} (ref >=60)
Globulin: 2.5 g/dL (calc) (ref 1.9–3.7)
Glucose, Bld: 158 mg/dL — ABNORMAL HIGH (ref 65–99)
Potassium: 4.1 mmol/L (ref 3.5–5.3)
Sodium: 138 mmol/L (ref 135–146)
Total Bilirubin: 1.1 mg/dL (ref 0.2–1.2)
Total Protein: 7 g/dL (ref 6.1–8.1)

## 2019-09-26 LAB — CBC WITH DIFFERENTIAL/PLATELET
Absolute Monocytes: 371 cells/uL (ref 200–950)
Basophils Absolute: 80 cells/uL (ref 0–200)
Basophils Relative: 1.5 %
Eosinophils Absolute: 122 cells/uL (ref 15–500)
Eosinophils Relative: 2.3 %
HCT: 48.7 % (ref 38.5–50.0)
Hemoglobin: 16.7 g/dL (ref 13.2–17.1)
Lymphs Abs: 1500 cells/uL (ref 850–3900)
MCH: 31.9 pg (ref 27.0–33.0)
MCHC: 34.3 g/dL (ref 32.0–36.0)
MCV: 92.9 fL (ref 80.0–100.0)
MPV: 10.7 fL (ref 7.5–12.5)
Monocytes Relative: 7 %
Neutro Abs: 3228 cells/uL (ref 1500–7800)
Neutrophils Relative %: 60.9 %
Platelets: 220 10*3/uL (ref 140–400)
RBC: 5.24 10*6/uL (ref 4.20–5.80)
RDW: 12.1 % (ref 11.0–15.0)
Total Lymphocyte: 28.3 %
WBC: 5.3 10*3/uL (ref 3.8–10.8)

## 2019-09-26 LAB — TSH: TSH: 1.71 mIU/L (ref 0.40–4.50)

## 2019-09-26 LAB — LIPID PANEL
Cholesterol: 173 mg/dL (ref <200)
HDL: 31 mg/dL — ABNORMAL LOW (ref >=40)
LDL Cholesterol (Calc): 108 mg/dL (calc) — ABNORMAL HIGH
Non-HDL Cholesterol (Calc): 142 mg/dL (calc) — ABNORMAL HIGH (ref <130)
Total CHOL/HDL Ratio: 5.6 (calc) — ABNORMAL HIGH (ref <5.0)
Triglycerides: 226 mg/dL — ABNORMAL HIGH (ref <150)

## 2019-09-26 LAB — HEMOGLOBIN A1C
Hgb A1c MFr Bld: 5.2 % of total Hgb (ref <5.7)
Mean Plasma Glucose: 103 (calc)
eAG (mmol/L): 5.7 (calc)

## 2019-09-26 LAB — VITAMIN D 25 HYDROXY (VIT D DEFICIENCY, FRACTURES): Vit D, 25-Hydroxy: 27 ng/mL — ABNORMAL LOW (ref 30–100)

## 2019-09-26 LAB — MAGNESIUM: Magnesium: 2 mg/dL (ref 1.5–2.5)

## 2019-09-26 LAB — INSULIN, RANDOM: Insulin: 59.9 u[IU]/mL — ABNORMAL HIGH

## 2019-10-01 ENCOUNTER — Other Ambulatory Visit: Payer: Self-pay | Admitting: Internal Medicine

## 2019-10-01 DIAGNOSIS — F411 Generalized anxiety disorder: Secondary | ICD-10-CM

## 2019-10-01 MED ORDER — CITALOPRAM HYDROBROMIDE 40 MG PO TABS: ORAL_TABLET | ORAL | 3 refills | Status: DC

## 2019-11-13 ENCOUNTER — Other Ambulatory Visit: Payer: Self-pay | Admitting: Adult Health

## 2019-11-13 DIAGNOSIS — I10 Essential (primary) hypertension: Secondary | ICD-10-CM

## 2019-12-03 NOTE — Progress Notes (Deleted)
Complete Physical  Assessment and Plan: Essential hypertension, benign - CBC with Differential/Platelet - BASIC METABOLIC PANEL WITH GFR - Hepatic function panel - TSH - Urinalysis, Routine w reflex microscopic (not at [facility]) - Microalbumin / creatinine urine ratio  Hyperlipidemia -continue medications, check lipids, decrease fatty foods, increase activity.  - Lipid panel   Hypogonadism in male Declines check at this time, weight loss advised  Vitamin D deficiency - Vit D  25 hydroxy (rtn osteoporosis monitoring)  Medication management - Magnesium  Tobacco chew use Advised to stop chewing, see dentist, not interesting  ERECTILE DYSFUNCTION States this has improved  Anxiety state continue medications, stress management techniques discussed, increase water, good sleep hygiene discussed, increase exercise, and increase veggies.   Encounter for general adult medical examination with abnormal findings  Obesity Obesity with co morbidities- long discussion about weight loss, diet, and exercise  Discussed med's effects and SE's. Screening labs and tests as requested with regular follow-up as recommended.  HPI 49 y.o. male  presents for a complete physical.  He was taking mobic and occ an ibuprofen, he stopped all his meds but bASA and lisinopril due to this and he is doing better.   He states he is still chewing and states that he thinks he will "not get cancer" has done since 49 years old and states since I don't have it now I won't get it.   His blood pressure has been controlled at home, today their BP is   He does not workout, he works on well pumps. He denies chest pain, shortness of breath, dizziness.   He is not on cholesterol medication, but suppose to be on fenofibrate.  His cholesterol is not at goal. Not fasting today. Drinking 1 pepsi in the morning. The cholesterol last visit was:  Lab Results  Component Value Date   CHOL 173 09/25/2019   HDL 31 (L) 09/25/2019    LDLCALC 108 (H) 09/25/2019   TRIG 226 (H) 09/25/2019   CHOLHDL 5.6 (H) 09/25/2019   He has been working on diet and exercise for prediabetes and has decreased drinking, and denies paresthesia of the feet and polydipsia. Last A1C in the office was:  Lab Results  Component Value Date   HGBA1C 5.2 09/25/2019   Last testosterone was low but he is not on treatment.  Lab Results  Component Value Date   TESTOSTERONE 230 (L) 11/17/2017   Patient is on Vitamin D supplement, takes sporacidly. Lab Results  Component Value Date   VD25OH 27 (L) 09/25/2019   BMI is There is no height or weight on file to calculate BMI., he is working on diet and exercise. He stopped drinking any alcohol.  Wt Readings from Last 3 Encounters:  09/25/19 211 lb 9.6 oz (96 kg)  11/30/18 220 lb 6.4 oz (100 kg)  11/17/17 217 lb (98.4 kg)    Current Medications:  Current Outpatient Medications on File Prior to Visit  Medication Sig Dispense Refill  . celecoxib (CELEBREX) 200 MG capsule TAKE ONE CAPSULE BY MOUTH TWICE DAILY 60 capsule 2  . Cholecalciferol (VITAMIN D-3) 5000 UNITS TABS Take by mouth 2 (two) times daily.    . citalopram (CELEXA) 40 MG tablet Take 1 tablet Daily for Mood 90 tablet 3  . fenofibrate 160 MG tablet Take 1 tablet (160 mg total) by mouth daily. 90 tablet 0  . lisinopril (ZESTRIL) 20 MG tablet Take 1 tablet Daily for BP 90 tablet 1  . meloxicam (MOBIC) 15 MG tablet  TAKE 1/2 TO 1 TABLET ONCE A DAY WITH FOOD FOR PAIN OR INFLAMMATION 30 tablet 0   No current facility-administered medications on file prior to visit.   Health Maintenance:  Immunization History  Administered Date(s) Administered  . Tdap 10/22/2013   Tetanus: 2014 Pneumovax: N/A Prevnar 13: due 69 Flu vaccine:declines Zostavax:N/A DEXA:N/A Colonoscopy: DUE age 31, no family history EGD: N/A  Medical History:  Past Medical History:  Diagnosis Date  . Dyslipidemia   . HTN (hypertension)    Allergies No Known  Allergies  SURGICAL HISTORY He  has a past surgical history that includes Vasectomy and Shoulder arthroscopy w/ rotator cuff repair (Right, 04/2015). FAMILY HISTORY His family history includes Heart attack in his father; Heart disease in his father. SOCIAL HISTORY He  reports that he has never smoked. His smokeless tobacco use includes chew. He reports that he does not drink alcohol or use drugs.   Review of Systems  Constitutional: Negative.   HENT: Negative.   Eyes: Negative.   Respiratory: Negative.   Cardiovascular: Negative.   Gastrointestinal: Negative.   Genitourinary: Negative for dysuria, flank pain, frequency, hematuria and urgency.  Musculoskeletal: Negative for back pain, falls, joint pain, myalgias (bilateral heel pain, worse in the AM) and neck pain.  Skin: Negative.   Neurological: Negative.   Psychiatric/Behavioral: Negative.     Physical Exam: Estimated body mass index is 31.25 kg/m as calculated from the following:   Height as of 09/25/19: 5\' 9"  (1.753 m).   Weight as of 09/25/19: 211 lb 9.6 oz (96 kg). There were no vitals taken for this visit. General Appearance: Well nourished, in no apparent distress. Eyes: PERRLA, EOMs, conjunctiva no swelling or erythema, normal fundi and vessels. Sinuses: No Frontal/maxillary tenderness ENT/Mouth: Ext aud canals clear, normal light reflex with TMs without erythema, bulging. Good dentition. No erythema, swelling, or exudate on post pharynx. Tonsils not swollen or erythematous. Hearing normal.  Neck: Supple, thyroid normal. No bruits Respiratory: Respiratory effort normal, BS equal bilaterally without rales, rhonchi, wheezing or stridor. Cardio: RRR without murmurs, rubs or gallops. Brisk peripheral pulses without edema.  Chest: symmetric, with normal excursions and percussion. Abdomen: Soft, +BS. Non tender, no guarding, rebound, hernias, masses, or organomegaly. .  Lymphatics: Non tender without lymphadenopathy.   Genitourinary: defer Musculoskeletal: Full ROM all peripheral extremities,5/5 strength, and normal gait.  Skin: Warm, dry without rashes, lesions, ecchymosis.  Neuro: Cranial nerves intact, reflexes equal bilaterally. Normal muscle tone, no cerebellar symptoms. Sensation intact.  Psych: Awake and oriented X 3, normal affect, Insight and Judgment appropriate.   EKG: defer next year  Izora Ribas 8:55 AM

## 2019-12-04 ENCOUNTER — Encounter: Payer: Self-pay | Admitting: Adult Health

## 2019-12-04 ENCOUNTER — Encounter: Payer: Self-pay | Admitting: Physician Assistant

## 2020-01-24 NOTE — Progress Notes (Signed)
Complete Physical  Assessment and Plan:  Encounter for general adult medical examination with abnormal findings  Essential hypertension, benign Above goal; add HCTZ 12.5 x 2 weeks, increase to 25 mg daily if needed for BP goal <130/80 Recheck in 6 weeks  Monitor blood pressure at home; call if consistently over 130/80 Continue DASH diet.   Reminder to go to the ER if any CP, SOB, nausea, dizziness, severe HA, changes vision/speech, left arm numbness and tingling and jaw pain. - CBC with Differential/Platelet - CMP/GFR - TSH - EKG - Urinalysis, Routine w reflex microscopic (not at [facility]) - Microalbumin / creatinine urine ratio  Hyperlipidemia -check lipids, decrease fatty foods, increase activity.  Lifestyle for trigs discussed - Lipid panel   Hypogonadism in male Declines check at this time, weight loss advised  ERECTILE DYSFUNCTION Not improved with tadalafil 2.5 mg via urology; would like to try higher dose Increase to 5 mg daily; follow up in 1 month. Alternately can try higher doses PRN rather than daily.   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 Screening labs and tests as requested with regular follow-up as recommended.  No future appointments.   HPI 49 y.o. male  presents for a complete physical. He has Hyperlipidemia; Anxiety; ERECTILE DYSFUNCTION; ESSENTIAL HYPERTENSION, BENIGN; Vitamin D deficiency; Medication management;  Tobacco chew use; Hypogonadism in male; Insulin resistance; and Obesity (BMI 30.0-34.9) on their problem list.  He owns a water service business, married, 1 boy, 1 grandchild 72 months old.  No concerns today.   He was taking mobic and occ an ibuprofen, for stiff hands in AM. Has done tumeric and noted benefit as well but takes intermittently.  He states he is still chewing, has done so since he was 8, has no intention of quitting.   BMI is Body mass index is 31.77 kg/m., he has been working on diet, trying to limit bread and potatoes, trying to get <200 lb very active job so no intentional exercise.  He estimates over a gallon of water most days.  He does report 2 cans of pepsi daily. Willing to cut back, likes the "ice" drinks  Wt Readings from Last 3 Encounters:  01/25/20 212 lb (96.2 kg)  09/25/19 211 lb 9.6 oz (96 kg)  11/30/18 220 lb 6.4 oz (100 kg)   His blood pressure has note been controlled at home (up to 150-170/90s), today their BP is BP: (!) 142/82 He does not workout, he works on well pumps. He denies chest pain, shortness of breath, dizziness.    He is not on cholesterol medication, but suppose to be on fenofibrate but has not been taking. Patient states cut back significantly .  His cholesterol is not at goal. Not fasting today. Drinking 1 pepsi in the morning. The cholesterol last visit was:  Lab Results  Component Value Date   CHOL 173 09/25/2019   HDL 31 (L) 09/25/2019   LDLCALC 108 (H) 09/25/2019   TRIG 226 (H) 09/25/2019   CHOLHDL 5.6 (  H) 09/25/2019   He has been working on diet and exercise for glucose management and has decreased drinking, and denies paresthesia of the feet and polydipsia. Last A1C in the office was:  Lab Results  Component Value Date   HGBA1C 5.2 09/25/2019    Last GFR:  Lab Results  Component Value Date   GFRNONAA 97 09/25/2019   Last testosterone was low but he is not on treatment. has declined checks   Lab Results  Component Value  Date   TESTOSTERONE 230 (L) 11/17/2017   Patient is on Vitamin D supplement, takes 10000 IU daily  Lab Results  Component Value Date   VD25OH 69 (L) 09/25/2019   He has seen urologist Dr. Alinda Money due to ED, was prescribed tadalafil 2.5 mg daily, would like to try higher dose, not quite work. Denies nocturia, endorses some weak stream, mostly during the day but seems to improve in the evening, denies sense of residual volume, split stream, dribbling.  No results found for: PSA1, PSA      Current Medications:  Current Outpatient Medications on File Prior to Visit  Medication Sig Dispense Refill  . ASPIRIN 81 PO Take by mouth.    . Cholecalciferol (VITAMIN D-3) 5000 UNITS TABS Take by mouth 2 (two) times daily.    Marland Kitchen lisinopril (ZESTRIL) 20 MG tablet Take 1 tablet Daily for BP 90 tablet 1  . meloxicam (MOBIC) 15 MG tablet TAKE 1/2 TO 1 TABLET ONCE A DAY WITH FOOD FOR PAIN OR INFLAMMATION 30 tablet 0   No current facility-administered medications on file prior to visit.   Health Maintenance:  Immunization History  Administered Date(s) Administered  . Tdap 10/22/2013   Tetanus: 2014 Pneumovax: N/A Prevnar 13: due 43 Flu vaccine:declines  Zostavax:N/A DEXA:N/A Colonoscopy: DUE age 69, no family history EGD: N/A  Last eye exam: remote Last dental exam: Dr. Tia Masker, 2020   Medical History:  Past Medical History:  Diagnosis Date  . Dyslipidemia   . HTN (hypertension)    Allergies No Known Allergies  SURGICAL HISTORY He  has a past surgical history that includes Vasectomy (1992) and Shoulder arthroscopy w/ rotator cuff repair (Right, 04/2015). FAMILY HISTORY His family history includes Heart attack in his father; Heart disease in his father. SOCIAL HISTORY He  reports that he has never smoked. His smokeless tobacco use includes chew. He reports current alcohol use of about 6.0 standard drinks of alcohol per week. He reports that he does not use drugs.   Review of Systems   Constitutional: Negative.  Negative for malaise/fatigue and weight loss.  HENT: Negative.  Negative for hearing loss and tinnitus.   Eyes: Negative.  Negative for blurred vision and double vision.  Respiratory: Negative.  Negative for cough, shortness of breath and wheezing.   Cardiovascular: Negative.  Negative for chest pain, palpitations, orthopnea, claudication and leg swelling.  Gastrointestinal: Negative.  Negative for abdominal pain, blood in stool, constipation, diarrhea, heartburn, melena, nausea and vomiting.  Genitourinary: Negative.  Negative for dysuria, flank pain, frequency, hematuria and urgency.  Musculoskeletal: Negative.  Negative for back pain, falls, joint pain, myalgias and neck pain.  Skin: Negative.  Negative for rash.  Neurological: Negative.  Negative for dizziness, tingling, sensory change, weakness and headaches.  Endo/Heme/Allergies: Negative for polydipsia.  Psychiatric/Behavioral: Negative.  Negative for depression and substance abuse. The patient is not nervous/anxious and does not have insomnia.   All other systems reviewed and are negative.   Physical Exam: Estimated body mass index  is 31.77 kg/m as calculated from the following:   Height as of this encounter: 5' 8.5" (1.74 m).   Weight as of this encounter: 212 lb (96.2 kg). BP (!) 142/82   Pulse 83   Temp (!) 97.5 F (36.4 C)   Ht 5' 8.5" (1.74 m)   Wt 212 lb (96.2 kg)   SpO2 97%   BMI 31.77 kg/m  General Appearance: Well nourished, in no apparent distress. Eyes: PERRLA, EOMs, conjunctiva no swelling or erythema, normal fundi and vessels. Sinuses: No Frontal/maxillary tenderness ENT/Mouth: Ext aud canals clear, normal light reflex with TMs without erythema, bulging. Good dentition. No erythema, swelling, or exudate on post pharynx. Tonsils not swollen or erythematous. Hearing normal.  Neck: Supple, thyroid normal. No bruits Respiratory: Respiratory effort normal, BS equal bilaterally without  rales, rhonchi, wheezing or stridor. Cardio: RRR without murmurs, rubs or gallops. Brisk peripheral pulses without edema.  Chest: symmetric, with normal excursions and percussion. Abdomen: Soft, +BS. Non tender, no guarding, rebound, hernias, masses, or organomegaly. .  Lymphatics: Non tender without lymphadenopathy.  Genitourinary: defer Musculoskeletal: Full ROM all peripheral extremities,5/5 strength, and normal gait.  Skin: Warm, dry without rashes, lesions, ecchymosis.  Neuro: Cranial nerves intact, reflexes equal bilaterally. Normal muscle tone, no cerebellar symptoms. Sensation intact.  Psych: Awake and oriented X 3, normal affect, Insight and Judgment appropriate.   EKG: NSR  Carlyon Shadow Aima Mcwhirt 9:55 AM

## 2020-01-25 ENCOUNTER — Ambulatory Visit: Payer: 59 | Admitting: Adult Health

## 2020-01-25 ENCOUNTER — Other Ambulatory Visit: Payer: Self-pay

## 2020-01-25 ENCOUNTER — Encounter: Payer: Self-pay | Admitting: Adult Health

## 2020-01-25 VITALS — BP 129/94 | HR 83 | Temp 97.5°F | Ht 68.5 in | Wt 212.0 lb

## 2020-01-25 DIAGNOSIS — I10 Essential (primary) hypertension: Secondary | ICD-10-CM

## 2020-01-25 DIAGNOSIS — E559 Vitamin D deficiency, unspecified: Secondary | ICD-10-CM

## 2020-01-25 DIAGNOSIS — Z0000 Encounter for general adult medical examination without abnormal findings: Secondary | ICD-10-CM

## 2020-01-25 DIAGNOSIS — E291 Testicular hypofunction: Secondary | ICD-10-CM

## 2020-01-25 DIAGNOSIS — F528 Other sexual dysfunction not due to a substance or known physiological condition: Secondary | ICD-10-CM

## 2020-01-25 DIAGNOSIS — E669 Obesity, unspecified: Secondary | ICD-10-CM

## 2020-01-25 DIAGNOSIS — Z79899 Other long term (current) drug therapy: Secondary | ICD-10-CM

## 2020-01-25 DIAGNOSIS — Z0001 Encounter for general adult medical examination with abnormal findings: Secondary | ICD-10-CM

## 2020-01-25 DIAGNOSIS — Z1329 Encounter for screening for other suspected endocrine disorder: Secondary | ICD-10-CM

## 2020-01-25 DIAGNOSIS — Z136 Encounter for screening for cardiovascular disorders: Secondary | ICD-10-CM

## 2020-01-25 DIAGNOSIS — Z720 Tobacco use: Secondary | ICD-10-CM

## 2020-01-25 DIAGNOSIS — E785 Hyperlipidemia, unspecified: Secondary | ICD-10-CM

## 2020-01-25 DIAGNOSIS — Z131 Encounter for screening for diabetes mellitus: Secondary | ICD-10-CM

## 2020-01-25 DIAGNOSIS — Z125 Encounter for screening for malignant neoplasm of prostate: Secondary | ICD-10-CM

## 2020-01-25 DIAGNOSIS — F419 Anxiety disorder, unspecified: Secondary | ICD-10-CM

## 2020-01-25 DIAGNOSIS — Z8249 Family history of ischemic heart disease and other diseases of the circulatory system: Secondary | ICD-10-CM | POA: Diagnosis not present

## 2020-01-25 DIAGNOSIS — Z8042 Family history of malignant neoplasm of prostate: Secondary | ICD-10-CM

## 2020-01-25 DIAGNOSIS — E8881 Metabolic syndrome: Secondary | ICD-10-CM

## 2020-01-25 MED ORDER — HYDROCHLOROTHIAZIDE 25 MG PO TABS: ORAL_TABLET | ORAL | 1 refills | Status: DC

## 2020-01-25 MED ORDER — TADALAFIL 5 MG PO TABS: 5.0000 mg | ORAL_TABLET | Freq: Every day | ORAL | 2 refills | Status: DC

## 2020-01-25 NOTE — Patient Instructions (Addendum)
Walter Wilkinson , Thank you for taking time to come for your Annual Wellness Visit. I appreciate your ongoing commitment to your health goals. Please review the following plan we discussed and let me know if I can assist you in the future.   These are the goals we discussed: Goals    . Blood Pressure < 130/80    . DIET - DECREASE SODA OR JUICE INTAKE    . DIET - EAT MORE FRUITS AND VEGETABLES    . Weight (lb) < 200 lb (90.7 kg)       This is a list of the screening recommended for you and due dates:  Health Maintenance  Topic Date Due  . Flu Shot  02/20/2020*  . Tetanus Vaccine  10/23/2023  . HIV Screening  Completed  *Topic was postponed. The date shown is not the original due date.     Try tumeric + bioprene (black pepper fruit) and/or tart cherry supplement daily - natural antiinflammatory to help with stiff hands  Try to limit meloxicam 1/2 to 1 tab only if needed, avoid daily if possible due to potential to damage kidneys  Let me know in 1 month how higher dose of tadalafil is working - if still not helpful, we can do higher doses but AS NEEDED ONLY    Know what a healthy weight is for you (roughly BMI <25) and aim to maintain this  Aim for 7+ servings of fruits and vegetables daily  65-80+ fluid ounces of water or unsweet tea for healthy kidneys  Limit to max 1 drink of alcohol per day; avoid smoking/tobacco  Limit animal fats in diet for cholesterol and heart health - choose grass fed whenever available  Avoid highly processed foods, and foods high in saturated/trans fats  Aim for low stress - take time to unwind and care for your mental health  Aim for 150 min of moderate intensity exercise weekly for heart health, and weights twice weekly for bone health  Aim for 7-9 hours of sleep daily     HYPERTENSION INFORMATION  Monitor your blood pressure at home, please keep a record and bring that in with you to your next office visit.   Go to the ER if any CP, SOB,  nausea, dizziness, severe HA, changes vision/speech  Testing/Procedures: HOW TO TAKE YOUR BLOOD PRESSURE:  Rest 5 minutes before taking your blood pressure.  Don't smoke or drink caffeinated beverages for at least 30 minutes before.  Take your blood pressure before (not after) you eat.  Sit comfortably with your back supported and both feet on the floor (don't cross your legs).  Elevate your arm to heart level on a table or a desk.  Use the proper sized cuff. It should fit smoothly and snugly around your bare upper arm. There should be enough room to slip a fingertip under the cuff. The bottom edge of the cuff should be 1 inch above the crease of the elbow.  Due to a recent study, SPRINT, we have changed our goal for the systolic or top blood pressure number. Ideally we want your top number at 120.  In the [facility] Trial, 5000 people were randomized to a goal BP of 120 and 5000 people were randomized to a goal BP of less than 140. The patients with the goal BP at 120 had LESS DEMENTIA, LESS HEART ATTACKS, AND LESS STROKES, AS WELL AS OVERALL DECREASED MORTALITY OR DEATH RATE.   There was another study that showed taking  your blood pressure medications at night decrease cardiovascular events.  However if you are on a fluid pill, please take this in the morning.   If you are willing, our goal BP is the top number of 120.   Your most recent BP: BP: (!) 129/94   Take your medications faithfully as instructed. Maintain a healthy weight. Get at least 150 minutes of aerobic exercise per week. Minimize salt intake. Minimize alcohol intake  DASH Eating Plan DASH stands for "Dietary Approaches to Stop Hypertension." The DASH eating plan is a healthy eating plan that has been shown to reduce high blood pressure (hypertension). Additional health benefits may include reducing the risk of type 2 diabetes mellitus, heart disease, and stroke. The DASH eating plan may also help with weight loss. WHAT  DO I NEED TO KNOW ABOUT THE DASH EATING PLAN? For the DASH eating plan, you will follow these general guidelines:  Choose foods with a percent daily value for sodium of less than 5% (as listed on the food label).  Use salt-free seasonings or herbs instead of table salt or sea salt.  Check with your health care provider or pharmacist before using salt substitutes.  Eat lower-sodium products, often labeled as "lower sodium" or "no salt added."  Eat fresh foods.  Eat more vegetables, fruits, and low-fat dairy products.  Choose whole grains. Look for the word "whole" as the first word in the ingredient list.  Choose fish and skinless chicken or [place] more often than red meat. Limit fish, poultry, and meat to 6 oz (170 g) each day.  Limit sweets, desserts, sugars, and sugary drinks.  Choose heart-healthy fats.  Limit cheese to 1 oz (28 g) per day.  Eat more home-cooked food and less restaurant, buffet, and fast food.  Limit fried foods.  Cook foods using methods other than frying.  Limit canned vegetables. If you do use them, rinse them well to decrease the sodium.  When eating at a restaurant, ask that your food be prepared with less salt, or no salt if possible. WHAT FOODS CAN I EAT? Seek help from a dietitian for individual calorie needs. Grains Whole grain or whole wheat bread. Brown rice. Whole grain or whole wheat pasta. Quinoa, bulgur, and whole grain cereals. Low-sodium cereals. Corn or whole wheat flour tortillas. Whole grain cornbread. Whole grain crackers. Low-sodium crackers. Vegetables Fresh or frozen vegetables (raw, steamed, roasted, or grilled). Low-sodium or reduced-sodium tomato and vegetable juices. Low-sodium or reduced-sodium tomato sauce and paste. Low-sodium or reduced-sodium canned vegetables.  Fruits All fresh, canned (in natural juice), or frozen fruits. Meat and Other Protein Products Ground beef (85% or leaner), grass-fed beef, or beef trimmed of  fat. Skinless chicken or [place]. Ground chicken or [place]. Pork trimmed of fat. All fish and seafood. Eggs. Dried beans, peas, or lentils. Unsalted nuts and seeds. Unsalted canned beans. Dairy Low-fat dairy products, such as skim or 1% milk, 2% or reduced-fat cheeses, low-fat ricotta or cottage cheese, or plain low-fat yogurt. Low-sodium or reduced-sodium cheeses. Fats and Oils Tub margarines without trans fats. Light or reduced-fat mayonnaise and salad dressings (reduced sodium). Avocado. Safflower, olive, or canola oils. Natural peanut or almond butter. Other Unsalted popcorn and pretzels. The items listed above may not be a complete list of recommended foods or beverages. Contact your dietitian for more options. WHAT FOODS ARE NOT RECOMMENDED? Grains White bread. White pasta. White rice. Refined cornbread. Bagels and croissants. Crackers that contain trans fat. Vegetables Creamed or fried vegetables. Vegetables  in a cheese sauce. Regular canned vegetables. Regular canned tomato sauce and paste. Regular tomato and vegetable juices. Fruits Dried fruits. Canned fruit in light or heavy syrup. Fruit juice. Meat and Other Protein Products Fatty cuts of meat. Ribs, chicken wings, bacon, sausage, bologna, salami, chitterlings, fatback, hot dogs, bratwurst, and packaged luncheon meats. Salted nuts and seeds. Canned beans with salt. Dairy Whole or 2% milk, cream, half-and-half, and cream cheese. Whole-fat or sweetened yogurt. Full-fat cheeses or blue cheese. Nondairy creamers and whipped toppings. Processed cheese, cheese spreads, or cheese curds. Condiments Onion and garlic salt, seasoned salt, table salt, and sea salt. Canned and packaged gravies. Worcestershire sauce. Tartar sauce. Barbecue sauce. Teriyaki sauce. Soy sauce, including reduced sodium. Steak sauce. Fish sauce. Oyster sauce. Cocktail sauce. Horseradish. Ketchup and mustard. Meat flavorings and tenderizers. Bouillon cubes. Hot sauce.  Tabasco sauce. Marinades. Taco seasonings. Relishes. Fats and Oils Butter, stick margarine, lard, shortening, ghee, and bacon fat. Coconut, palm kernel, or palm oils. Regular salad dressings. Other Pickles and olives. Salted popcorn and pretzels. The items listed above may not be a complete list of foods and beverages to avoid. Contact your dietitian for more information. WHERE CAN I FIND MORE INFORMATION? National Heart, Lung, and Blood Institute: travelstabloid.com Document Released: 10/28/2011 Document Revised: 03/25/2014 Document Reviewed: 09/12/2013 [facility] Patient Information 2015 [place], [place]. This information is not intended to replace advice given to you by your health care provider. Make sure you discuss any questions you have with your health care provider.      Smokeless Tobacco Information, Adult  Tobacco is a leafy plant that contains a chemical (nicotine). Nicotine affects your brain and causes you to become addicted to it. Smokeless tobacco is tobacco that you put in your mouth instead of smoking it. It may also be called chewing tobacco or snuff. Smokeless tobacco is made from the leaves of tobacco plants and comes in many forms, such as:  Loose, dry leaves.  Plugs or twists.  Moist pouches.  Dissolving lozenges or strips. Chewing, sucking, or holding the tobacco in your mouth causes your mouth to make more saliva. The saliva mixes with the tobacco, which you swallow or spit out. Using tobacco is harmful to your health. How can smokeless tobacco affect me? All forms of tobacco contain many chemicals that can harm every organ in the body. Using smokeless tobacco increases your risk for:  Cancer. Tobacco use is one of the leading causes of cancer. Smokeless tobacco is linked to cancer of the mouth, esophagus, and pancreas.  Other long-term health problems, including high blood pressure, heart disease, and  stroke.  Addiction.  Pregnancy complications, if this applies. Pregnant women who use smokeless tobacco are more likely to have a miscarriage or deliver a baby too early.  Mouth and dental problems, such as: ? Bad breath. ? Teeth that look yellow or brown. ? Mouth sores. ? Cracking and bleeding lips. ? Gum recession, gum disease, or tooth decay. ? Lesions on the soft tissues of your mouth (leukoplakia). The benefits of not using smokeless tobacco include:  A healthy mind and body.  Saving money. You avoid the cost of buying tobacco and the cost of treating illnesses that are caused by tobacco. What actions can I take to quit using tobacco? Ask your health care provider for help to quit using smokeless tobacco. This may involve treatment. These tips may also help you quit:  Pick a date to quit. Set a date within the next two weeks. This gives  you time to prepare.  Write down the reasons why you are quitting. Keep this list in places where you will see it often, such as on your bathroom mirror or in your car or wallet.  Identify the people, places, things, and activities that make you want to use smokeless tobacco (triggers). Avoid them.  Get rid of any tobacco you have and remove any tobacco smells. To do this: ? Throw away all containers of tobacco at home, at work, and in your car. ? Throw away any other items that you use regularly when you chew tobacco. ? Clean your car and make sure to remove all tobacco-related items. ? Clean your home, including curtains and carpets.  Tell your family and friends that you are quitting. Having support can make quitting easier.  Chew sugarless gum or sunflower seeds when you want to use smokeless tobacco.  Stay positive. Be prepared for cravings. It is common to slip up when you first quit, so take it one day at a time.  Stay busy and take care of your body. Get plenty of exercise. Drink enough water to keep your urine pale yellow.  Keep  track of how many days have passed since you quit. Remembering how long and hard you have worked to quit can help you avoid using smokeless tobacco again. Where to find support Ask your health care provider if there is a local support group for quitting smokeless tobacco. Where to find more information You can learn more about the risks of using smokeless tobacco and the benefits of quitting from these sources:  American Cancer Society: www.cancer.org  National Cancer Institute: www.cancer.gov  Centers for Disease Control and Prevention: FootballExhibition.com.br Contact a health care provider if you have:  Trouble quitting smokeless tobacco use on your own.  White or other discolored patches in your mouth.  Difficulty swallowing.  A change in your voice.  Unexplained weight loss.  Stomach pain, nausea, or vomiting. Summary  Smokeless tobacco contains many different chemicals that are known to cause cancer.  Nicotine is an addictive chemical in smokeless tobacco that affects your brain.  The benefits of not using smokeless tobacco include having a healthy mind and body and saving money.  Tell your family and friends that you are quitting. Having support can make quitting easier.  Ask your health care provider for help quitting smokeless tobacco. This may involve treatment. This information is not intended to replace advice given to you by your health care provider. Make sure you discuss any questions you have with your health care provider. Document Revised: 08/03/2019 Document Reviewed: 01/15/2019 Elsevier Patient Education  2020 [employer].

## 2020-01-26 LAB — CBC WITH DIFFERENTIAL/PLATELET
Absolute Monocytes: 559 cells/uL (ref 200–950)
Basophils Absolute: 72 cells/uL (ref 0–200)
Basophils Relative: 1.1 %
Eosinophils Absolute: 78 cells/uL (ref 15–500)
Eosinophils Relative: 1.2 %
HCT: 47.1 % (ref 38.5–50.0)
Hemoglobin: 16.5 g/dL (ref 13.2–17.1)
Lymphs Abs: 1398 cells/uL (ref 850–3900)
MCH: 32.2 pg (ref 27.0–33.0)
MCHC: 35 g/dL (ref 32.0–36.0)
MCV: 92 fL (ref 80.0–100.0)
MPV: 10.5 fL (ref 7.5–12.5)
Monocytes Relative: 8.6 %
Neutro Abs: 4394 cells/uL (ref 1500–7800)
Neutrophils Relative %: 67.6 %
Platelets: 254 10*3/uL (ref 140–400)
RBC: 5.12 10*6/uL (ref 4.20–5.80)
RDW: 12.3 % (ref 11.0–15.0)
Total Lymphocyte: 21.5 %
WBC: 6.5 10*3/uL (ref 3.8–10.8)

## 2020-01-26 LAB — COMPLETE METABOLIC PANEL WITH GFR
AG Ratio: 1.8 (calc) (ref 1.0–2.5)
ALT: 18 U/L (ref 9–46)
AST: 21 U/L (ref 10–40)
Albumin: 4.5 g/dL (ref 3.6–5.1)
Alkaline phosphatase (APISO): 63 U/L (ref 36–130)
BUN: 17 mg/dL (ref 7–25)
CO2: 27 mmol/L (ref 20–32)
Calcium: 9.8 mg/dL (ref 8.6–10.3)
Chloride: 102 mmol/L (ref 98–110)
Creat: 0.93 mg/dL (ref 0.60–1.35)
GFR, Est African American: 112 mL/min/{1.73_m2} (ref >=60)
GFR, Est Non African American: 97 mL/min/{1.73_m2} (ref >=60)
Globulin: 2.5 g/dL (calc) (ref 1.9–3.7)
Glucose, Bld: 103 mg/dL — ABNORMAL HIGH (ref 65–99)
Potassium: 4 mmol/L (ref 3.5–5.3)
Sodium: 138 mmol/L (ref 135–146)
Total Bilirubin: 1.2 mg/dL (ref 0.2–1.2)
Total Protein: 7 g/dL (ref 6.1–8.1)

## 2020-01-26 LAB — LIPID PANEL
Cholesterol: 153 mg/dL (ref <200)
HDL: 37 mg/dL — ABNORMAL LOW (ref >=40)
LDL Cholesterol (Calc): 88 mg/dL (calc)
Non-HDL Cholesterol (Calc): 116 mg/dL (calc) (ref <130)
Total CHOL/HDL Ratio: 4.1 (calc) (ref <5.0)
Triglycerides: 182 mg/dL — ABNORMAL HIGH (ref <150)

## 2020-01-26 LAB — MAGNESIUM: Magnesium: 2.2 mg/dL (ref 1.5–2.5)

## 2020-01-26 LAB — URINALYSIS, ROUTINE W REFLEX MICROSCOPIC
Bilirubin Urine: NEGATIVE
Glucose, UA: NEGATIVE
Hgb urine dipstick: NEGATIVE
Ketones, ur: NEGATIVE
Leukocytes,Ua: NEGATIVE
Nitrite: NEGATIVE
Protein, ur: NEGATIVE
Specific Gravity, Urine: 1.011 (ref 1.001–1.03)
pH: 7.5 (ref 5.0–8.0)

## 2020-01-26 LAB — PSA: PSA: 0.3 ng/mL (ref <=4.0)

## 2020-01-26 LAB — HEMOGLOBIN A1C
Hgb A1c MFr Bld: 5.3 % of total Hgb (ref <5.7)
Mean Plasma Glucose: 105 (calc)
eAG (mmol/L): 5.8 (calc)

## 2020-01-26 LAB — MICROALBUMIN / CREATININE URINE RATIO
Creatinine, Urine: 64 mg/dL (ref 20–320)
Microalb Creat Ratio: 3 ug/mg{creat}
Microalb, Ur: 0.2 mg/dL

## 2020-01-26 LAB — VITAMIN D 25 HYDROXY (VIT D DEFICIENCY, FRACTURES): Vit D, 25-Hydroxy: 37 ng/mL (ref 30–100)

## 2020-01-26 LAB — TSH: TSH: 1.27 mIU/L (ref 0.40–4.50)

## 2020-03-07 ENCOUNTER — Ambulatory Visit: Payer: 59

## 2020-05-09 ENCOUNTER — Other Ambulatory Visit: Payer: Self-pay | Admitting: Adult Health

## 2020-06-19 ENCOUNTER — Other Ambulatory Visit: Payer: Self-pay | Admitting: Adult Health

## 2020-06-19 DIAGNOSIS — I10 Essential (primary) hypertension: Secondary | ICD-10-CM

## 2020-07-23 NOTE — Progress Notes (Signed)
Assessment and Plan:  Essential hypertension, benign -     CBC with Differential/Platelet -     COMPLETE METABOLIC PANEL WITH GFR - continue medications, DASH diet, exercise and monitor at home. Call if greater than 130/80.   Hyperlipidemia, unspecified hyperlipidemia type -     Lipid panel check lipids decrease fatty foods increase activity.   Vitamin D deficiency Continue same, defer today.  Insulin resistance monitor  Future Appointments  Date Time Provider Department Center  01/28/2021  9:00 AM Quentin Mulling, PA-C [id number] None     Continue diet and meds as discussed. Further disposition pending results of labs. Over 30 minutes of exam, counseling, chart review, and critical decision making was performed  HPI 49 y.o. male  presents for 3 month follow up on hypertension, cholesterol, and vitamin D deficiency.   His blood pressure has been controlled at home, today their BP is BP: 136/90  He does not workout, has physically active job. He denies chest pain, shortness of breath, dizziness.  BMI is Body mass index is 31.47 kg/m., he is working on diet and exercise. Wt Readings from Last 3 Encounters:  07/29/20 210 lb (95.3 kg)  01/25/20 212 lb (96.2 kg)  09/25/19 211 lb 9.6 oz (96 kg)     He is not on cholesterol medication, he is not on fenofibrate He is drinking alcohol and drinks one pepsi in the AM. His cholesterol is not at goal. The cholesterol last visit was:   Lab Results  Component Value Date   CHOL 153 01/25/2020   HDL 37 (L) 01/25/2020   LDLCALC 88 01/25/2020   TRIG 182 (H) 01/25/2020   CHOLHDL 4.1 01/25/2020  Last A1C in the office was:  Lab Results  Component Value Date   HGBA1C 5.3 01/25/2020  Patient is on Vitamin D supplement.   Lab Results  Component Value Date   VD25OH 37 01/25/2020    Current Medications:    Current Outpatient Medications (Cardiovascular):  .  hydrochlorothiazide (HYDRODIURIL) 25 MG tablet, Take 1/2 tab daily to begin  with for blood pressure; if remains above 130/80 increase to whole tab daily. Marland Kitchen  lisinopril (ZESTRIL) 20 MG tablet, TAKE 1 TABLET ONCE DAILY FOR BLOOD PRESSURE .  tadalafil (CIALIS) 5 MG tablet, TAKE 1 TABLET DAILY AS DIRECTED   Current Outpatient Medications (Analgesics):  Marland Kitchen  ASPIRIN 81 PO, Take by mouth. .  meloxicam (MOBIC) 15 MG tablet, TAKE 1/2 TO 1 TABLET ONCE A DAY WITH FOOD FOR PAIN OR INFLAMMATION   Current Outpatient Medications (Other):  Marland Kitchen  Cholecalciferol (VITAMIN D-3) 5000 UNITS TABS, Take by mouth 2 (two) times daily.  Medical History:  Past Medical History:  Diagnosis Date  . Anxiety 07/15/2010   Qualifier: Diagnosis of  By: Antoine Poche, MD, Gerrit Heck    . Dyslipidemia   . HTN (hypertension)   . Hypogonadism in male 08/12/2015   Allergies: No Known Allergies   Review of Systems:  Review of Systems  Constitutional: Negative.   HENT: Negative.   Eyes: Negative.   Respiratory: Negative.   Cardiovascular: Negative.   Gastrointestinal: Negative.   Genitourinary: Negative.   Musculoskeletal: Negative for back pain, falls, joint pain, myalgias and neck pain.  Skin: Negative.   Neurological: Negative.   Endo/Heme/Allergies: Negative.   Psychiatric/Behavioral: Negative.     Family history- Review and unchanged Social history- Review and unchanged Physical Exam: BP 136/90   Pulse 82   Temp (!) 97.5 F (36.4 C)   Wt 210  lb (95.3 kg)   SpO2 99%   BMI 31.47 kg/m  Wt Readings from Last 3 Encounters:  07/29/20 210 lb (95.3 kg)  01/25/20 212 lb (96.2 kg)  09/25/19 211 lb 9.6 oz (96 kg)   General Appearance: Well nourished, in no apparent distress. Eyes: PERRLA, EOMs, conjunctiva no swelling or erythema Sinuses: No Frontal/maxillary tenderness ENT/Mouth: Ext aud canals clear, TMs without erythema, bulging. No erythema, swelling, or exudate on post pharynx.  Tonsils not swollen or erythematous. Hearing normal.  Neck: Supple, thyroid normal.  Respiratory:  Respiratory effort normal, BS equal bilaterally without rales, rhonchi, wheezing or stridor.  Cardio: RRR with no MRGs. Brisk peripheral pulses without edema.  Abdomen: Soft, + BS,  Non tender, no guarding, rebound, hernias, masses. Lymphatics: Non tender without lymphadenopathy.  Musculoskeletal: Full ROM, 5/5 strength, Normal gait.  Skin: Warm, dry without rashes, lesions, ecchymosis.  Neuro: Cranial nerves intact. Normal muscle tone, no cerebellar symptoms. Psych: Awake and oriented X 3, normal affect, Insight and Judgment appropriate.    Quentin Mulling, PA-C 9:02 AM [facility]

## 2020-07-29 ENCOUNTER — Ambulatory Visit: Payer: 59 | Admitting: Physician Assistant

## 2020-07-29 ENCOUNTER — Other Ambulatory Visit: Payer: Self-pay

## 2020-07-29 ENCOUNTER — Encounter: Payer: Self-pay | Admitting: Physician Assistant

## 2020-07-29 VITALS — BP 136/90 | HR 82 | Temp 97.5°F | Wt 210.0 lb

## 2020-07-29 DIAGNOSIS — E785 Hyperlipidemia, unspecified: Secondary | ICD-10-CM | POA: Diagnosis not present

## 2020-07-29 DIAGNOSIS — Z79899 Other long term (current) drug therapy: Secondary | ICD-10-CM

## 2020-07-29 DIAGNOSIS — E559 Vitamin D deficiency, unspecified: Secondary | ICD-10-CM

## 2020-07-29 DIAGNOSIS — I10 Essential (primary) hypertension: Secondary | ICD-10-CM

## 2020-07-29 DIAGNOSIS — E8881 Metabolic syndrome: Secondary | ICD-10-CM

## 2020-07-29 DIAGNOSIS — Z720 Tobacco use: Secondary | ICD-10-CM

## 2020-07-30 LAB — COMPLETE METABOLIC PANEL WITH GFR
AG Ratio: 1.8 (calc) (ref 1.0–2.5)
ALT: 22 U/L (ref 9–46)
AST: 25 U/L (ref 10–40)
Albumin: 5 g/dL (ref 3.6–5.1)
Alkaline phosphatase (APISO): 69 U/L (ref 36–130)
BUN: 15 mg/dL (ref 7–25)
CO2: 29 mmol/L (ref 20–32)
Calcium: 10.5 mg/dL — ABNORMAL HIGH (ref 8.6–10.3)
Chloride: 102 mmol/L (ref 98–110)
Creat: 1.07 mg/dL (ref 0.60–1.35)
GFR, Est African American: 95 mL/min/{1.73_m2} (ref >=60)
GFR, Est Non African American: 82 mL/min/{1.73_m2} (ref >=60)
Globulin: 2.8 g/dL (calc) (ref 1.9–3.7)
Glucose, Bld: 102 mg/dL — ABNORMAL HIGH (ref 65–99)
Potassium: 4.3 mmol/L (ref 3.5–5.3)
Sodium: 139 mmol/L (ref 135–146)
Total Bilirubin: 0.9 mg/dL (ref 0.2–1.2)
Total Protein: 7.8 g/dL (ref 6.1–8.1)

## 2020-07-30 LAB — CBC WITH DIFFERENTIAL/PLATELET
Absolute Monocytes: 639 cells/uL (ref 200–950)
Basophils Absolute: 100 cells/uL (ref 0–200)
Basophils Relative: 1.3 %
Eosinophils Absolute: 100 cells/uL (ref 15–500)
Eosinophils Relative: 1.3 %
HCT: 49.6 % (ref 38.5–50.0)
Hemoglobin: 17.3 g/dL — ABNORMAL HIGH (ref 13.2–17.1)
Lymphs Abs: 1833 cells/uL (ref 850–3900)
MCH: 32.8 pg (ref 27.0–33.0)
MCHC: 34.9 g/dL (ref 32.0–36.0)
MCV: 94.1 fL (ref 80.0–100.0)
MPV: 10.3 fL (ref 7.5–12.5)
Monocytes Relative: 8.3 %
Neutro Abs: 5028 cells/uL (ref 1500–7800)
Neutrophils Relative %: 65.3 %
Platelets: 257 10*3/uL (ref 140–400)
RBC: 5.27 10*6/uL (ref 4.20–5.80)
RDW: 12.6 % (ref 11.0–15.0)
Total Lymphocyte: 23.8 %
WBC: 7.7 10*3/uL (ref 3.8–10.8)

## 2020-07-30 LAB — LIPID PANEL
Cholesterol: 223 mg/dL — ABNORMAL HIGH (ref <200)
HDL: 31 mg/dL — ABNORMAL LOW (ref >=40)
Non-HDL Cholesterol (Calc): 192 mg/dL (calc) — ABNORMAL HIGH (ref <130)
Total CHOL/HDL Ratio: 7.2 (calc) — ABNORMAL HIGH (ref <5.0)
Triglycerides: 671 mg/dL — ABNORMAL HIGH (ref <150)

## 2020-09-25 ENCOUNTER — Other Ambulatory Visit: Payer: Self-pay | Admitting: Adult Health

## 2020-09-25 ENCOUNTER — Other Ambulatory Visit: Payer: Self-pay | Admitting: Internal Medicine

## 2020-09-25 DIAGNOSIS — M7711 Lateral epicondylitis, right elbow: Secondary | ICD-10-CM

## 2020-09-25 DIAGNOSIS — I10 Essential (primary) hypertension: Secondary | ICD-10-CM

## 2020-09-29 ENCOUNTER — Telehealth: Payer: Self-pay

## 2020-09-29 ENCOUNTER — Other Ambulatory Visit: Payer: Self-pay | Admitting: Adult Health

## 2020-09-29 MED ORDER — TADALAFIL 5 MG PO TABS: 5.0000 mg | ORAL_TABLET | Freq: Every day | ORAL | 2 refills | Status: DC

## 2020-09-29 NOTE — Telephone Encounter (Signed)
Refill request for Cialis.

## 2020-12-03 ENCOUNTER — Encounter: Payer: 59 | Admitting: Adult Health

## 2021-01-26 ENCOUNTER — Encounter: Payer: 59 | Admitting: Adult Health Nurse Practitioner

## 2021-01-28 ENCOUNTER — Encounter: Payer: 59 | Admitting: Physician Assistant

## 2021-01-28 NOTE — Progress Notes (Deleted)
Complete Physical  Assessment and Plan:  Encounter for general adult medical examination with abnormal findings  Essential hypertension, benign Above goal; add HCTZ 12.5 x 2 weeks, increase to 25 mg daily if needed for BP goal <130/80 Recheck in 6 weeks  Monitor blood pressure at home; call if consistently over 130/80 Continue DASH diet.   Reminder to go to the ER if any CP, SOB, nausea, dizziness, severe HA, changes vision/speech, left arm numbness and tingling and jaw pain. - CBC with Differential/Platelet - CMP/GFR - TSH - EKG - Urinalysis, Routine w reflex microscopic (not at [facility]) - Microalbumin / creatinine urine ratio  Hyperlipidemia -check lipids, decrease fatty foods, increase activity.  Lifestyle for trigs discussed - Lipid panel   Hypogonadism in male Declines check at this time, weight loss advised  ERECTILE DYSFUNCTION Not improved with tadalafil 2.5 mg via urology; would like to try higher dose Increase to 5 mg daily; follow up in 1 month. Alternately can try higher doses PRN rather than daily.   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 Screening labs and tests as requested with regular follow-up as recommended.  Future Appointments  Date Time Provider Department Center  01/29/2021  9:00 AM Judd Gaudier, NP [facility] None     HPI 50 y.o. male  presents for a complete physical. He has Hyperlipidemia; ERECTILE  DYSFUNCTION; ESSENTIAL HYPERTENSION, BENIGN; Vitamin D deficiency; Medication management; Tobacco chew use; Insulin resistance; and Obesity (BMI 30.0-34.9) on their problem list.  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  No concerns today.   He was taking mobic and occ an ibuprofen, for stiff hands in AM. Has done tumeric and noted benefit as well but takes intermittently.  He states he is still chewing, has done so since he was 8, has no intention of quitting.   BMI is There is no height or weight on file to calculate BMI., he has been working on diet, trying to limit bread and potatoes, trying to get <200 lb very active job so no intentional exercise.  He estimates over a gallon of water most days.  He does report 2 cans of pepsi daily. Willing to cut back, likes the "ice" drinks Wt Readings from Last 3 Encounters:  07/29/20 210 lb (95.3 kg)  01/25/20 212 lb (96.2 kg)  09/25/19 211 lb 9.6 oz (96 kg)   His blood pressure has not been controlled at home (up to 150-170/90s), today their BP is   He does not workout, he works on well pumps. He denies chest pain, shortness of breath, dizziness.    He is not on cholesterol medication, but suppose to be on fenofibrate but has not been taking. Patient states cut back significantly .  His cholesterol is not at goal. Not fasting today. Drinking 1 pepsi in the morning. The cholesterol last visit was:  Lab Results  Component Value Date   CHOL 223 (H) 07/29/2020   HDL 31 (L) 07/29/2020  LDLCALC  07/29/2020     Comment:     . LDL cholesterol not calculated. Triglyceride levels greater than 400 mg/dL invalidate calculated LDL results. . Reference range: <100 . Desirable range <100 mg/dL for primary prevention;   <70 mg/dL for patients with CHD or diabetic patients  with > or = 2 CHD risk factors. Marland Kitchen LDL-C is now calculated using the Martin-Hopkins  calculation, which is a validated novel method providing   better accuracy than the Friedewald equation in the  estimation of LDL-C.  Horald Pollen et al. Lenox Ahr. 3338;329(19): 2061-2068  (http://education.[facility].com/faq/FAQ164)    TRIG 671 (H) 07/29/2020   CHOLHDL 7.2 (H) 07/29/2020   He has been working on diet and exercise for glucose management and has decreased drinking, and denies paresthesia of the feet and polydipsia. Last A1C in the office was:  Lab Results  Component Value Date   HGBA1C 5.3 01/25/2020    Last GFR:  Lab Results  Component Value Date   GFRNONAA 82 07/29/2020   Last testosterone was low but he is not on treatment. Has declined checks   Lab Results  Component Value Date   TESTOSTERONE 230 (L) 11/17/2017   Patient is on Vitamin D supplement, takes 16606 IU daily  Lab Results  Component Value Date   VD25OH 37 01/25/2020   He has seen urologist Dr. Laverle Patter due to ED, was prescribed tadalafil 5 mg daily. Denies nocturia, endorses some weak stream, mostly during the day but seems to improve in the evening, denies sense of residual volume, split stream, dribbling.  Lab Results  Component Value Date   PSA 0.3 01/25/2020     Lab Results  Component Value Date   VITAMINB12 446 05/08/2014   Lab Results  Component Value Date   IRON 82 05/08/2014   TIBC 331 05/08/2014   FERRITIN 246 05/08/2014   CBC Latest Ref Rng & Units 07/29/2020 01/25/2020 09/25/2019  WBC 3.8 - 10.8 Thousand/uL 7.7 6.5 5.3  Hemoglobin 13.2 - 17.1 g/dL 17.3(H) 16.5 16.7  Hematocrit 38.5 - 50.0 % 49.6 47.1 48.7  Platelets 140 - 400 Thousand/uL 257 254 220      Current Medications:  Current Outpatient Medications on File Prior to Visit  Medication Sig Dispense Refill  . ASPIRIN 81 PO Take by mouth.    . Cholecalciferol (VITAMIN D-3) 5000 UNITS TABS Take by mouth 2 (two) times daily.    . hydrochlorothiazide (HYDRODIURIL) 25 MG tablet Take 1/2 tab daily to begin with for blood pressure; if remains above 130/80 increase to whole tab daily.  90 tablet 1  . lisinopril (ZESTRIL) 20 MG tablet Take         1 tablet       Daily        for BP 90 tablet 0  . meloxicam (MOBIC) 15 MG tablet TAKE 1/2 TO 1 TABLET ONCE A DAY WITH FOOD FOR PAIN OR INFLAMMATION 30 tablet 0  . tadalafil (CIALIS) 5 MG tablet Take 1 tablet (5 mg total) by mouth daily. as directed 30 tablet 2   No current facility-administered medications on file prior to visit.   Health Maintenance:  Immunization History  Administered Date(s) Administered  . Tdap 10/22/2013   ***  Tetanus: 2014 Pneumovax: N/A Prevnar 13: due 65 Flu vaccine:declines Covid 19: *** Shingrix: ***  Colonoscopy: DUE  no family history EGD: N/A  Last eye exam: remote Last dental exam: Dr. Reola Calkins, 2020   Medical History:  Past Medical History:  Diagnosis  Date  . Anxiety 07/15/2010   Qualifier: Diagnosis of  By: Antoine Poche, MD, Gerrit Heck    . Dyslipidemia   . HTN (hypertension)   . Hypogonadism in male 08/12/2015   Allergies No Known Allergies  SURGICAL HISTORY He  has a past surgical history that includes Vasectomy (1992) and Shoulder arthroscopy w/ rotator cuff repair (Right, 04/2015). FAMILY HISTORY His family history includes Heart attack in his father; Heart disease in his father; Prostate cancer in his paternal great-grandfather. SOCIAL HISTORY He  reports that he has never smoked. His smokeless tobacco use includes chew. He reports current alcohol use of about 6.0 standard drinks of alcohol per week. He reports that he does not use drugs.   Review of Systems  Constitutional: Negative.  Negative for malaise/fatigue and weight loss.  HENT: Negative.  Negative for hearing loss and tinnitus.   Eyes: Negative.  Negative for blurred vision and double vision.  Respiratory: Negative.  Negative for cough, shortness of breath and wheezing.   Cardiovascular: Negative.  Negative for chest pain, palpitations, orthopnea, claudication and leg swelling.  Gastrointestinal: Negative.   Negative for abdominal pain, blood in stool, constipation, diarrhea, heartburn, melena, nausea and vomiting.  Genitourinary: Negative.  Negative for dysuria, flank pain, frequency, hematuria and urgency.  Musculoskeletal: Negative.  Negative for back pain, falls, joint pain, myalgias and neck pain.  Skin: Negative.  Negative for rash.  Neurological: Negative.  Negative for dizziness, tingling, sensory change, weakness and headaches.  Endo/Heme/Allergies: Negative for polydipsia.  Psychiatric/Behavioral: Negative.  Negative for depression and substance abuse. The patient is not nervous/anxious and does not have insomnia.   All other systems reviewed and are negative.   Physical Exam: Estimated body mass index is 31.47 kg/m as calculated from the following:   Height as of 01/25/20: 5' 8.5" (1.74 m).   Weight as of 07/29/20: 210 lb (95.3 kg). There were no vitals taken for this visit. General Appearance: Well nourished, in no apparent distress. Eyes: PERRLA, EOMs, conjunctiva no swelling or erythema, normal fundi and vessels. Sinuses: No Frontal/maxillary tenderness ENT/Mouth: Ext aud canals clear, normal light reflex with TMs without erythema, bulging. Good dentition. No erythema, swelling, or exudate on post pharynx. Tonsils not swollen or erythematous. Hearing normal.  Neck: Supple, thyroid normal. No bruits Respiratory: Respiratory effort normal, BS equal bilaterally without rales, rhonchi, wheezing or stridor. Cardio: RRR without murmurs, rubs or gallops. Brisk peripheral pulses without edema.  Chest: symmetric, with normal excursions and percussion. Abdomen: Soft, +BS. Non tender, no guarding, rebound, hernias, masses, or organomegaly. .  Lymphatics: Non tender without lymphadenopathy.  Genitourinary: defer Musculoskeletal: Full ROM all peripheral extremities,5/5 strength, and normal gait.  Skin: Warm, dry without rashes, lesions, ecchymosis.  Neuro: Cranial nerves intact, reflexes equal  bilaterally. Normal muscle tone, no cerebellar symptoms. Sensation intact.  Psych: Awake and oriented X 3, normal affect, Insight and Judgment appropriate.   EKG: NSR  Dan Maker 8:35 AM

## 2021-01-29 ENCOUNTER — Encounter: Payer: 59 | Admitting: Adult Health

## 2021-03-02 ENCOUNTER — Other Ambulatory Visit: Payer: Self-pay | Admitting: Adult Health

## 2021-07-10 ENCOUNTER — Other Ambulatory Visit: Payer: Self-pay | Admitting: Internal Medicine

## 2021-07-10 DIAGNOSIS — I10 Essential (primary) hypertension: Secondary | ICD-10-CM

## 2021-10-12 NOTE — Progress Notes (Signed)
Complete Physical  Assessment and Plan:  Encounter for general adult medical examination with abnormal findings Due Yearly  Essential hypertension, benign Increase Lisinopril to 30mg  QD Monitor blood pressure at home; call if consistently over 130/80 Continue DASH diet.   Reminder to go to the ER if any CP, SOB, nausea, dizziness, severe HA, changes vision/speech, left arm numbness and tingling and jaw pain. - CBC with Differential/Platelet - CMP/GFR - TSH - EKG - Urinalysis, Routine w reflex microscopic (not at [facility]) - Microalbumin / creatinine urine ratio  Hyperlipidemia -check lipids, decrease fatty foods, increase activity.  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 Screening labs and tests as requested with regular follow-up as recommended.  Future Appointments  Date Time Provider [place]  10/19/2022  9:00 AM Magda Bernheim, NP [facility] None     HPI 50 y.o. male  presents for a complete physical. He has Hyperlipidemia; Erectile dysfunction; ESSENTIAL HYPERTENSION, BENIGN; Vitamin D deficiency; Medication management; Tobacco chew use; Insulin resistance; and Obesity (BMI 30.0-34.9) on their problem list.  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   He was taking mobic and occ an ibuprofen, for stiff hands in AM. Has done tumeric and noted benefit as well but takes intermittently.  He states he is still chewing, has done so since he was 8, has no intention of quitting.   BMI is Body mass index is 32.99 kg/m., he has been working on diet, trying to limit bread and potatoes, trying to get <200 lb very active job so no intentional exercise.  He estimates over a gallon of water most days.  Wt Readings from Last 3 Encounters:  10/19/21 220 lb 3.2 oz (99.9 kg)  07/29/20 210 lb (95.3 kg)  01/25/20 212 lb (96.2 kg)   His blood pressure has note been controlled at home (up to 130/80-90's), today their BP is BP: (!) 132/98. He is not taking HCTZ he was started on, states it made him feel dizzy BP Readings from Last 3 Encounters:  10/19/21 (!) 132/98  07/29/20 136/90  01/25/20 (!) 129/94    He does not workout, he works on well pumps. He denies chest pain, shortness of breath, dizziness.    He is not on cholesterol medication.  His cholesterol is not at goal. Not fasting today. Drinking 1 pepsi in the morning. He does have a history of intolerance to statin medication.The cholesterol last visit was:  Lab Results  Component Value Date   CHOL 223 (H) 07/29/2020   HDL 31 (L) 07/29/2020   [place]  07/29/2020     Comment:     . LDL cholesterol not calculated. Triglyceride levels greater than 400 mg/dL invalidate calculated LDL results. . Reference range: <100 . Desirable range <100 mg/dL for  primary prevention;   <70 mg/dL for patients with CHD or diabetic patients  with > or = 2 CHD risk factors. Marland Kitchen LDL-C is now calculated using the Martin-Hopkins  calculation, which is a validated novel method providing  better accuracy than the Friedewald equation in the  estimation of LDL-C.  Cresenciano Genre et al. Annamaria Helling. [id number]): 2061-2068   (http://education.[facility].com/faq/FAQ164)    TRIG 671 (H) 07/29/2020   CHOLHDL 7.2 (H) 07/29/2020   He has been working on diet and exercise for glucose management and has decreased drinking, and denies paresthesia of the feet and polydipsia. Last A1C in the office was:  Lab Results  Component Value Date   HGBA1C 5.3 01/25/2020    Last GFR:  Lab Results  Component Value Date   GFRNONAA 82 07/29/2020     Patient is on Vitamin D supplement, takes super beets daily Lab Results  Component Value Date   VD25OH 37 01/25/2020   He has seen urologist Dr. Alinda Money due to ED, was prescribed tadalafil 5mg  and is doing ok. Lab Results  Component Value Date   PSA 0.3 01/25/2020        Current Medications:  Current Outpatient Medications on File Prior to Visit  Medication Sig Dispense Refill   ASPIRIN 81 PO Take by mouth.     Cholecalciferol (VITAMIN D-3) 5000 UNITS TABS Take by mouth 2 (two) times daily.     lisinopril (ZESTRIL) 20 MG tablet TAKE 1 TABLET ONCE DAILY FOR BLOOD PRESSURE 90 tablet 1   tadalafil (CIALIS) 5 MG tablet Take  1 tablet  Daily  for Prostate 90 tablet 2   hydrochlorothiazide (HYDRODIURIL) 25 MG tablet Take 1/2 tab daily to begin with for blood pressure; if remains above 130/80 increase to whole tab daily. (Patient not taking: Reported on 10/19/2021) 90 tablet 1   meloxicam (MOBIC) 15 MG tablet TAKE 1/2 TO 1 TABLET ONCE A DAY WITH FOOD FOR PAIN OR INFLAMMATION (Patient not taking: Reported on 10/19/2021) 30 tablet 0   No current facility-administered medications on file prior to visit.   Health Maintenance:  Immunization History  Administered Date(s) Administered   Tdap 10/22/2013   Tetanus: 2014 Pneumovax: N/A Prevnar 13: due 26 Flu vaccine:declines  Zostavax:N/A DEXA:N/A Colonoscopy: DUE age 9, no family history EGD: N/A  Last eye exam: remote Last dental exam: Dr. Tia Masker, 2020   Medical History:  Past Medical History:  Diagnosis Date    Anxiety 07/15/2010   Qualifier: Diagnosis of  By: Percival Spanish, MD, Farrel Gordon     Dyslipidemia    HTN (hypertension)    Hypogonadism in male 08/12/2015   Allergies No Known Allergies  SURGICAL HISTORY He  has a past surgical history that includes Vasectomy (1992) and Shoulder arthroscopy w/ rotator cuff repair (Right, 04/2015). FAMILY HISTORY His family history includes Heart attack in his father; Heart disease in his father; Prostate cancer in his paternal great-grandfather. SOCIAL HISTORY He  reports that he has never smoked. His smokeless tobacco use includes chew. He reports current alcohol use of about 6.0 standard drinks per week. He reports that he does not use drugs.   Review of Systems  Constitutional: Negative.  Negative for malaise/fatigue and weight loss.  HENT: Negative.  Negative for hearing loss and tinnitus.   Eyes: Negative.  Negative for blurred vision and double vision.  Respiratory: Negative.  Negative for cough, shortness of breath and wheezing.   Cardiovascular: Negative.  Negative for chest pain, palpitations, orthopnea, claudication and leg swelling.  Gastrointestinal: Negative.  Negative for abdominal pain, blood in stool, constipation, diarrhea, heartburn, melena, nausea and vomiting.  Genitourinary: Negative.  Negative for dysuria, flank pain, frequency, hematuria and urgency.  Musculoskeletal:  Positive for back pain and joint pain (hand pain). Negative for falls, myalgias and neck pain.  Skin: Negative.  Negative for rash.  Neurological: Negative.  Negative for dizziness, tingling, sensory change, weakness and headaches.  Endo/Heme/Allergies:  Negative for polydipsia.  Psychiatric/Behavioral: Negative.  Negative for depression and substance abuse. The patient is not nervous/anxious and does not have insomnia.   All other systems reviewed and are negative.  Physical Exam: Estimated body mass index is 32.99 kg/m as calculated from the following:   Height  as of this encounter: 5' 8.5" (1.74 m).   Weight as of this encounter: 220 lb 3.2 oz (99.9 kg). BP (!) 132/98   Pulse 87   Temp 97.7 F (36.5 C)   Ht 5' 8.5" (1.74 m)   Wt 220 lb 3.2 oz (99.9 kg)   SpO2 95%   BMI 32.99 kg/m  General Appearance: Well nourished, in no apparent distress. Eyes: PERRLA, EOMs, conjunctiva no swelling or erythema, normal fundi and vessels. Sinuses: No Frontal/maxillary tenderness ENT/Mouth: Ext aud canals clear, normal light reflex with TMs without erythema, bulging. Good dentition. No erythema, swelling, or exudate on post pharynx. Tonsils not swollen or erythematous. Hearing normal.  Neck: Supple, thyroid normal. No bruits Respiratory: Respiratory effort normal, BS equal bilaterally without rales, rhonchi, wheezing or stridor. Cardio: RRR without murmurs, rubs or gallops. Brisk peripheral pulses without edema.  Chest: symmetric, with normal excursions and percussion. Abdomen: Soft, +BS. Non tender, no guarding, rebound, hernias, masses, or organomegaly. .  Lymphatics: Non tender without lymphadenopathy.  Genitourinary: defer Musculoskeletal: Full ROM all peripheral extremities,5/5 strength, and normal gait.  Skin: Warm, dry without rashes, lesions, ecchymosis.  Neuro: Cranial nerves intact, reflexes equal bilaterally. Normal muscle tone, no cerebellar symptoms. Sensation intact.  Psych: Awake and oriented X 3, normal affect, Insight and Judgment appropriate.   EKG: NSR  Yamil Dougher W Marium Ragan 9:07 AM

## 2021-10-19 ENCOUNTER — Other Ambulatory Visit: Payer: Self-pay

## 2021-10-19 ENCOUNTER — Encounter: Payer: Self-pay | Admitting: Nurse Practitioner

## 2021-10-19 ENCOUNTER — Ambulatory Visit: Payer: 59 | Admitting: Nurse Practitioner

## 2021-10-19 VITALS — BP 132/98 | HR 87 | Temp 97.7°F | Ht 68.5 in | Wt 220.2 lb

## 2021-10-19 DIAGNOSIS — Z79899 Other long term (current) drug therapy: Secondary | ICD-10-CM

## 2021-10-19 DIAGNOSIS — Z0000 Encounter for general adult medical examination without abnormal findings: Secondary | ICD-10-CM

## 2021-10-19 DIAGNOSIS — Z136 Encounter for screening for cardiovascular disorders: Secondary | ICD-10-CM | POA: Diagnosis not present

## 2021-10-19 DIAGNOSIS — N529 Male erectile dysfunction, unspecified: Secondary | ICD-10-CM

## 2021-10-19 DIAGNOSIS — E559 Vitamin D deficiency, unspecified: Secondary | ICD-10-CM

## 2021-10-19 DIAGNOSIS — I10 Essential (primary) hypertension: Secondary | ICD-10-CM

## 2021-10-19 DIAGNOSIS — Z1389 Encounter for screening for other disorder: Secondary | ICD-10-CM

## 2021-10-19 DIAGNOSIS — R7309 Other abnormal glucose: Secondary | ICD-10-CM

## 2021-10-19 DIAGNOSIS — E785 Hyperlipidemia, unspecified: Secondary | ICD-10-CM

## 2021-10-19 DIAGNOSIS — F419 Anxiety disorder, unspecified: Secondary | ICD-10-CM

## 2021-10-19 DIAGNOSIS — E291 Testicular hypofunction: Secondary | ICD-10-CM

## 2021-10-19 DIAGNOSIS — Z8042 Family history of malignant neoplasm of prostate: Secondary | ICD-10-CM

## 2021-10-19 DIAGNOSIS — E669 Obesity, unspecified: Secondary | ICD-10-CM

## 2021-10-19 DIAGNOSIS — Z720 Tobacco use: Secondary | ICD-10-CM

## 2021-10-19 DIAGNOSIS — Z0001 Encounter for general adult medical examination with abnormal findings: Secondary | ICD-10-CM

## 2021-10-19 MED ORDER — LISINOPRIL 30 MG PO TABS: 30.0000 mg | ORAL_TABLET | Freq: Every day | ORAL | 11 refills | Status: DC

## 2021-10-19 NOTE — Patient Instructions (Signed)

## 2021-10-20 LAB — URINALYSIS, ROUTINE W REFLEX MICROSCOPIC
Bilirubin Urine: NEGATIVE
Glucose, UA: NEGATIVE
Hgb urine dipstick: NEGATIVE
Ketones, ur: NEGATIVE
Leukocytes,Ua: NEGATIVE
Nitrite: NEGATIVE
Protein, ur: NEGATIVE
Specific Gravity, Urine: 1.009 (ref 1.001–1.035)
pH: 7.5 (ref 5.0–8.0)

## 2021-10-20 LAB — MICROALBUMIN / CREATININE URINE RATIO
Creatinine, Urine: 49 mg/dL (ref 20–320)
Microalb, Ur: <0.2 mg/dL

## 2021-10-20 LAB — CBC WITH DIFFERENTIAL/PLATELET
Absolute Monocytes: 494 cells/uL (ref 200–950)
Basophils Absolute: 91 cells/uL (ref 0–200)
Basophils Relative: 1.4 %
Eosinophils Absolute: 98 cells/uL (ref 15–500)
Eosinophils Relative: 1.5 %
HCT: 49.5 % (ref 38.5–50.0)
Hemoglobin: 17 g/dL (ref 13.2–17.1)
Lymphs Abs: 1729 cells/uL (ref 850–3900)
MCH: 32.1 pg (ref 27.0–33.0)
MCHC: 34.3 g/dL (ref 32.0–36.0)
MCV: 93.6 fL (ref 80.0–100.0)
MPV: 10.7 fL (ref 7.5–12.5)
Monocytes Relative: 7.6 %
Neutro Abs: 4089 cells/uL (ref 1500–7800)
Neutrophils Relative %: 62.9 %
Platelets: 248 10*3/uL (ref 140–400)
RBC: 5.29 10*6/uL (ref 4.20–5.80)
RDW: 12.5 % (ref 11.0–15.0)
Total Lymphocyte: 26.6 %
WBC: 6.5 10*3/uL (ref 3.8–10.8)

## 2021-10-20 LAB — HEMOGLOBIN A1C
Hgb A1c MFr Bld: 5.5 % of total Hgb (ref <5.7)
Mean Plasma Glucose: 111 mg/dL
eAG (mmol/L): 6.2 mmol/L

## 2021-10-20 LAB — COMPLETE METABOLIC PANEL WITH GFR
AG Ratio: 1.9 (calc) (ref 1.0–2.5)
ALT: 18 U/L (ref 9–46)
AST: 18 U/L (ref 10–40)
Albumin: 4.7 g/dL (ref 3.6–5.1)
Alkaline phosphatase (APISO): 76 U/L (ref 36–130)
BUN: 11 mg/dL (ref 7–25)
CO2: 27 mmol/L (ref 20–32)
Calcium: 10.1 mg/dL (ref 8.6–10.3)
Chloride: 103 mmol/L (ref 98–110)
Creat: 0.88 mg/dL (ref 0.60–1.29)
Globulin: 2.5 g/dL (calc) (ref 1.9–3.7)
Glucose, Bld: 99 mg/dL (ref 65–99)
Potassium: 4.2 mmol/L (ref 3.5–5.3)
Sodium: 140 mmol/L (ref 135–146)
Total Bilirubin: 0.7 mg/dL (ref 0.2–1.2)
Total Protein: 7.2 g/dL (ref 6.1–8.1)
eGFR: 105 mL/min/{1.73_m2} (ref >=60)

## 2021-10-20 LAB — MAGNESIUM: Magnesium: 1.9 mg/dL (ref 1.5–2.5)

## 2021-10-20 LAB — PSA: PSA: 0.32 ng/mL (ref <=4.00)

## 2021-10-20 LAB — LIPID PANEL
Cholesterol: 192 mg/dL (ref <200)
HDL: 34 mg/dL — ABNORMAL LOW (ref >=40)
LDL Cholesterol (Calc): 110 mg/dL (calc) — ABNORMAL HIGH
Non-HDL Cholesterol (Calc): 158 mg/dL (calc) — ABNORMAL HIGH (ref <130)
Total CHOL/HDL Ratio: 5.6 (calc) — ABNORMAL HIGH (ref <5.0)
Triglycerides: 357 mg/dL — ABNORMAL HIGH (ref <150)

## 2021-10-20 LAB — VITAMIN D 25 HYDROXY (VIT D DEFICIENCY, FRACTURES): Vit D, 25-Hydroxy: 25 ng/mL — ABNORMAL LOW (ref 30–100)

## 2021-10-20 LAB — TSH: TSH: 1.66 mIU/L (ref 0.40–4.50)

## 2021-10-22 ENCOUNTER — Other Ambulatory Visit: Payer: Self-pay | Admitting: Nurse Practitioner

## 2021-10-22 MED ORDER — ROSUVASTATIN CALCIUM 5 MG PO TABS: 5.0000 mg | ORAL_TABLET | Freq: Every day | ORAL | 11 refills | Status: DC

## 2021-10-22 NOTE — Progress Notes (Signed)
OK I sent it in, just tell him to call his pharmacy to cancel

## 2022-01-27 NOTE — Progress Notes (Deleted)
?FOLLOW UP ? ?Assessment and Plan:  ? ?Hypertension ?Well controlled with current medications  ?Monitor blood pressure at home; patient to call if consistently greater than 130/80 ?Continue DASH diet.   ?Reminder to go to the ER if any CP, SOB, nausea, dizziness, severe HA, changes vision/speech, left arm numbness and tingling and jaw pain. ? ?Cholesterol ?Currently at goal;  ?Continue low cholesterol diet and exercise.  ?Check lipid panel.  ? ?Diabetes {with or without complications:30421263} ?Continue medication: ?Continue diet and exercise.  ?Perform daily foot/skin check, notify office of any concerning changes.  ?Check A1C ? ?Obesity with co morbidities ?Long discussion about weight loss, diet, and exercise ?Recommended diet heavy in fruits and veggies and low in animal meats, cheeses, and dairy products, appropriate calorie intake ?Discussed ideal weight for height (below ***) and initial weight goal (***) ?Patient will work on *** ?Will follow up in 3 months ? ?Vitamin D Def ?At goal at last visit; continue supplementation to maintain goal of 60-100 ?Defer Vit D level ? ?Continue diet and meds as discussed. Further disposition pending results of labs. Discussed med's effects and SE's.   ?Over 30 minutes of exam, counseling, chart review, and critical decision making was performed.  ? ?Future Appointments  ?Date Time Provider [place]  ?01/29/2022  9:30 AM Magda Bernheim, NP [facility] None  ?10/19/2022  9:00 AM Magda Bernheim, NP [facility] None  ? ? ?---------------------------------------------------------------------------------------------------------------------- ? ?HPI ?51 y.o. male  presents for 3 month follow up on hypertension, cholesterol, diabetes, weight and vitamin D deficiency.  ? ?BMI is There is no height or weight on file to calculate BMI., he {HAS HAS KQ:[id number] been working on diet and exercise. ?Wt Readings from Last 3 Encounters:  ?10/19/21 220 lb 3.2 oz (99.9 kg)  ?07/29/20 210 lb  (95.3 kg)  ?01/25/20 212 lb (96.2 kg)  ? ? ?His blood pressure {HAS HAS NOT:18834} been controlled at home, today their BP is    ?BP Readings from Last 3 Encounters:  ?10/19/21 (!) 132/98  ?07/29/20 136/90  ?01/25/20 (!) 129/94  ?  ? He {DOES_DOES JZ:[id number] workout. He denies chest pain, shortness of breath, dizziness. ? ? He {ACTION; IS/IS GI:087931 on cholesterol medication {Cholesterol meds:21887} and denies myalgias. His cholesterol is not at goal. The cholesterol last visit was:   ?Lab Results  ?Component Value Date  ? CHOL 192 10/19/2021  ? HDL 34 (L) 10/19/2021  ? [place] 110 (H) 10/19/2021  ? TRIG 357 (H) 10/19/2021  ? CHOLHDL 5.6 (H) 10/19/2021  ? ? He {Has/has not:18111} been working on diet and exercise for prediabetes, and denies {Symptoms; diabetes w/o none:19199}. Last A1C in the office was:  ?Lab Results  ?Component Value Date  ? HGBA1C 5.5 10/19/2021  ? ?Patient is on Vitamin D supplement.   ?Lab Results  ?Component Value Date  ? VD25OH 25 (L) 10/19/2021  ?   ? ? ? ?Current Medications:  ?Current Outpatient Medications on File Prior to Visit  ?Medication Sig  ? ASPIRIN 81 PO Take by mouth.  ? Cholecalciferol (VITAMIN D-3) 5000 UNITS TABS Take by mouth 2 (two) times daily.  ? lisinopril (ZESTRIL) 30 MG tablet Take 1 tablet (30 mg total) by mouth daily.  ? rosuvastatin (CRESTOR) 5 MG tablet Take 1 tablet (5 mg total) by mouth daily.  ? tadalafil (CIALIS) 5 MG tablet Take  1 tablet  Daily  for Prostate  ? ?No current facility-administered medications on file prior to visit.  ? ? ? ?  Allergies: No Known Allergies  ? ?Medical History:  ?Past Medical History:  ?Diagnosis Date  ? Anxiety 07/15/2010  ? Qualifier: Diagnosis of  By: Percival Spanish, MD, Farrel Gordon    ? Dyslipidemia   ? HTN (hypertension)   ? Hypogonadism in male 08/12/2015  ? ?Family history- Reviewed and unchanged ?Social history- Reviewed and unchanged ? ? ?Review of Systems:  ?ROS ? ? ? ?Physical Exam: ?There were no vitals taken for this  visit. ?Wt Readings from Last 3 Encounters:  ?10/19/21 220 lb 3.2 oz (99.9 kg)  ?07/29/20 210 lb (95.3 kg)  ?01/25/20 212 lb (96.2 kg)  ? ?General Appearance: Well nourished, in no apparent distress. ?Eyes: PERRLA, EOMs, conjunctiva no swelling or erythema ?Sinuses: No Frontal/maxillary tenderness ?ENT/Mouth: Ext aud canals clear, TMs without erythema, bulging. No erythema, swelling, or exudate on post pharynx.  Tonsils not swollen or erythematous. Hearing normal.  ?Neck: Supple, thyroid normal.  ?Respiratory: Respiratory effort normal, BS equal bilaterally without rales, rhonchi, wheezing or stridor.  ?Cardio: RRR with no MRGs. Brisk peripheral pulses without edema.  ?Abdomen: Soft, + BS.  Non tender, no guarding, rebound, hernias, masses. ?Lymphatics: Non tender without lymphadenopathy.  ?Musculoskeletal: Full ROM, 5/5 strength, {PSY - GAIT AND STATION:22860} gait ?Skin: Warm, dry without rashes, lesions, ecchymosis.  ?Neuro: Cranial nerves intact. No cerebellar symptoms.  ?Psych: Awake and oriented X 3, normal affect, Insight and Judgment appropriate.  ? ? ?Magda Bernheim, NP ?9:07 AM ?[facility]  ?

## 2022-01-29 ENCOUNTER — Ambulatory Visit: Payer: 59 | Admitting: Nurse Practitioner

## 2022-01-29 DIAGNOSIS — R7309 Other abnormal glucose: Secondary | ICD-10-CM

## 2022-01-29 DIAGNOSIS — Z79899 Other long term (current) drug therapy: Secondary | ICD-10-CM

## 2022-01-29 DIAGNOSIS — E669 Obesity, unspecified: Secondary | ICD-10-CM

## 2022-01-29 DIAGNOSIS — E559 Vitamin D deficiency, unspecified: Secondary | ICD-10-CM

## 2022-01-29 DIAGNOSIS — I10 Essential (primary) hypertension: Secondary | ICD-10-CM

## 2022-01-29 DIAGNOSIS — E785 Hyperlipidemia, unspecified: Secondary | ICD-10-CM

## 2022-02-01 NOTE — Progress Notes (Signed)
FOLLOW UP  Assessment and Plan:   Hypertension Well controlled with current medications  Monitor blood pressure at home; patient to call if consistently greater than 130/80 Continue DASH diet.   Reminder to go to the ER if any CP, SOB, nausea, dizziness, severe HA, changes vision/speech, left arm numbness and tingling and jaw pain. - CBC  Cholesterol Currently not at goal; Rosuvastatin 5 mg gave pt dizziness. If remains elevated start Zetia- pt agreeable Encouraged low cholesterol diet and exercise.  Check lipid panel.   Abnormal glucose Continue medication: Continue diet and exercise.  Perform daily foot/skin check, notify office of any concerning changes.  Check CMP  Obesity with co morbidities Long discussion about weight loss, diet, and exercise Recommended diet heavy in fruits and veggies and low in animal meats, cheeses, and dairy products, appropriate calorie intake Will follow up in 3 months  Vitamin D Def At goal at last visit; continue supplementation to maintain goal of 60-100 Check Vit D level  Medication Management Continued   Continue diet and meds as discussed. Further disposition pending results of labs. Discussed med's effects and SE's.   Over 30 minutes of exam, counseling, chart review, and critical decision making was performed.   Future Appointments  Date Time Provider Department Center  10/19/2022  9:00 AM Revonda Humphrey, NP [facility] None    ----------------------------------------------------------------------------------------------------------------------  HPI 51 y.o. male  presents for 3 month follow up on hypertension, cholesterol, diabetes, weight and vitamin D deficiency.   BMI is Body mass index is 31.98 kg/m., he has been working on diet and exercise. Wt Readings from Last 3 Encounters:  02/02/22 213 lb 6.4 oz (96.8 kg)  10/19/21 220 lb 3.2 oz (99.9 kg)  07/29/20 210 lb (95.3 kg)    His blood pressure has been controlled at  home, today their BP is BP: 118/88  BP Readings from Last 3 Encounters:  02/02/22 118/88  10/19/21 (!) 132/98  07/29/20 136/90     He does not workout. He denies chest pain, shortness of breath, dizziness.  He is not on cholesterol medication. Tried Rosuvastatin 5 mg and got dizziness so he discontinued. His cholesterol is not at goal. The cholesterol last visit was:   Lab Results  Component Value Date   CHOL 192 10/19/2021   HDL 34 (L) 10/19/2021   LDLCALC 110 (H) 10/19/2021   TRIG 357 (H) 10/19/2021   CHOLHDL 5.6 (H) 10/19/2021    He has been working on diet and exercise for Last A1C in the office was:  Lab Results  Component Value Date   HGBA1C 5.5 10/19/2021   Patient is on Vitamin D supplement.   Lab Results  Component Value Date   VD25OH 25 (L) 10/19/2021         Current Medications:  Current Outpatient Medications on File Prior to Visit  Medication Sig   ASPIRIN 81 PO Take by mouth.   Cholecalciferol (VITAMIN D-3) 5000 UNITS TABS Take by mouth 2 (two) times daily.   lisinopril (ZESTRIL) 30 MG tablet Take 1 tablet (30 mg total) by mouth daily.   OVER THE COUNTER MEDICATION Super beets   tadalafil (CIALIS) 5 MG tablet Take  1 tablet  Daily  for Prostate   rosuvastatin (CRESTOR) 5 MG tablet Take 1 tablet (5 mg total) by mouth daily. (Patient not taking: Reported on 02/02/2022)   No current facility-administered medications on file prior to visit.     Allergies: No Known Allergies   Medical History:  Past Medical History:  Diagnosis Date   Anxiety 07/15/2010   Qualifier: Diagnosis of  By: Antoine Poche, MD, Gerrit Heck     Dyslipidemia    HTN (hypertension)    Hypogonadism in male 08/12/2015   Family history- Reviewed and unchanged Social history- Reviewed and unchanged   Review of Systems:  ROS    Physical Exam: BP 118/88   Pulse 85   Temp (!) 97.5 F (36.4 C)   Wt 213 lb 6.4 oz (96.8 kg)   SpO2 97%   BMI 31.98 kg/m  Wt Readings from Last 3  Encounters:  02/02/22 213 lb 6.4 oz (96.8 kg)  10/19/21 220 lb 3.2 oz (99.9 kg)  07/29/20 210 lb (95.3 kg)   General Appearance: Well nourished, in no apparent distress. Eyes: PERRLA, EOMs, conjunctiva no swelling or erythema Sinuses: No Frontal/maxillary tenderness ENT/Mouth: Ext aud canals clear, TMs without erythema, bulging. No erythema, swelling, or exudate on post pharynx.  Tonsils not swollen or erythematous. Hearing normal.  Neck: Supple, thyroid normal.  Respiratory: Respiratory effort normal, BS equal bilaterally without rales, rhonchi, wheezing or stridor.  Cardio: RRR with no MRGs. Brisk peripheral pulses without edema.  Abdomen: Soft, + BS.  Non tender, no guarding, rebound, hernias, masses. Lymphatics: Non tender without lymphadenopathy.  Musculoskeletal: Full ROM, 5/5 strength, Normal gait Skin: Warm, dry without rashes, lesions, ecchymosis.  Neuro: Cranial nerves intact. No cerebellar symptoms.  Psych: Awake and oriented X 3, normal affect, Insight and Judgment appropriate.    Revonda Humphrey, NP 9:12 AM [facility]

## 2022-02-02 ENCOUNTER — Encounter: Payer: Self-pay | Admitting: Nurse Practitioner

## 2022-02-02 ENCOUNTER — Other Ambulatory Visit: Payer: Self-pay

## 2022-02-02 ENCOUNTER — Ambulatory Visit: Payer: 59 | Admitting: Nurse Practitioner

## 2022-02-02 VITALS — BP 118/88 | HR 85 | Temp 97.5°F | Wt 213.4 lb

## 2022-02-02 DIAGNOSIS — E785 Hyperlipidemia, unspecified: Secondary | ICD-10-CM | POA: Diagnosis not present

## 2022-02-02 DIAGNOSIS — R7309 Other abnormal glucose: Secondary | ICD-10-CM

## 2022-02-02 DIAGNOSIS — E66811 Obesity, class 1: Secondary | ICD-10-CM

## 2022-02-02 DIAGNOSIS — E8881 Metabolic syndrome: Secondary | ICD-10-CM

## 2022-02-02 DIAGNOSIS — I10 Essential (primary) hypertension: Secondary | ICD-10-CM | POA: Diagnosis not present

## 2022-02-02 DIAGNOSIS — E559 Vitamin D deficiency, unspecified: Secondary | ICD-10-CM | POA: Diagnosis not present

## 2022-02-02 DIAGNOSIS — E669 Obesity, unspecified: Secondary | ICD-10-CM

## 2022-02-02 DIAGNOSIS — Z79899 Other long term (current) drug therapy: Secondary | ICD-10-CM

## 2022-02-02 DIAGNOSIS — E88819 Insulin resistance, unspecified: Secondary | ICD-10-CM

## 2022-02-03 LAB — COMPLETE METABOLIC PANEL WITHOUT GFR
AG Ratio: 1.6 (calc) (ref 1.0–2.5)
ALT: 18 U/L (ref 9–46)
AST: 18 U/L (ref 10–35)
Albumin: 4.6 g/dL (ref 3.6–5.1)
Alkaline phosphatase (APISO): 65 U/L (ref 35–144)
BUN: 11 mg/dL (ref 7–25)
CO2: 29 mmol/L (ref 20–32)
Calcium: 9.6 mg/dL (ref 8.6–10.3)
Chloride: 101 mmol/L (ref 98–110)
Creat: 1.06 mg/dL (ref 0.70–1.30)
Globulin: 2.8 g/dL (ref 1.9–3.7)
Glucose, Bld: 91 mg/dL (ref 65–99)
Potassium: 5.1 mmol/L (ref 3.5–5.3)
Sodium: 139 mmol/L (ref 135–146)
Total Bilirubin: 1.3 mg/dL — ABNORMAL HIGH (ref 0.2–1.2)
Total Protein: 7.4 g/dL (ref 6.1–8.1)
eGFR: 85 mL/min/1.73m2

## 2022-02-03 LAB — CBC WITH DIFFERENTIAL/PLATELET
Absolute Monocytes: 523 cells/uL (ref 200–950)
Basophils Absolute: 88 cells/uL (ref 0–200)
Basophils Relative: 1.4 %
Eosinophils Absolute: 183 cells/uL (ref 15–500)
Eosinophils Relative: 2.9 %
HCT: 49.9 % (ref 38.5–50.0)
Hemoglobin: 17.3 g/dL — ABNORMAL HIGH (ref 13.2–17.1)
Lymphs Abs: 1638 cells/uL (ref 850–3900)
MCH: 32.5 pg (ref 27.0–33.0)
MCHC: 34.7 g/dL (ref 32.0–36.0)
MCV: 93.8 fL (ref 80.0–100.0)
MPV: 10.3 fL (ref 7.5–12.5)
Monocytes Relative: 8.3 %
Neutro Abs: 3868 cells/uL (ref 1500–7800)
Neutrophils Relative %: 61.4 %
Platelets: 261 10*3/uL (ref 140–400)
RBC: 5.32 10*6/uL (ref 4.20–5.80)
RDW: 12.7 % (ref 11.0–15.0)
Total Lymphocyte: 26 %
WBC: 6.3 10*3/uL (ref 3.8–10.8)

## 2022-02-03 LAB — VITAMIN D 25 HYDROXY (VIT D DEFICIENCY, FRACTURES): Vit D, 25-Hydroxy: 29 ng/mL — ABNORMAL LOW (ref 30–100)

## 2022-02-03 LAB — LIPID PANEL
Cholesterol: 170 mg/dL
HDL: 33 mg/dL — ABNORMAL LOW
LDL Cholesterol (Calc): 98 mg/dL
Non-HDL Cholesterol (Calc): 137 mg/dL — ABNORMAL HIGH
Total CHOL/HDL Ratio: 5.2 (calc) — ABNORMAL HIGH
Triglycerides: 274 mg/dL — ABNORMAL HIGH

## 2022-02-10 ENCOUNTER — Other Ambulatory Visit: Payer: Self-pay | Admitting: Internal Medicine

## 2022-03-16 ENCOUNTER — Ambulatory Visit (INDEPENDENT_AMBULATORY_CARE_PROVIDER_SITE_OTHER): Payer: 59

## 2022-03-16 ENCOUNTER — Ambulatory Visit: Payer: 59 | Admitting: Orthopedic Surgery

## 2022-03-16 ENCOUNTER — Encounter: Payer: Self-pay | Admitting: Orthopedic Surgery

## 2022-03-16 VITALS — BP 182/107 | HR 85 | Ht 68.5 in | Wt 216.0 lb

## 2022-03-16 DIAGNOSIS — M5441 Lumbago with sciatica, right side: Secondary | ICD-10-CM | POA: Diagnosis not present

## 2022-03-16 DIAGNOSIS — G8929 Other chronic pain: Secondary | ICD-10-CM | POA: Diagnosis not present

## 2022-03-16 DIAGNOSIS — M5442 Lumbago with sciatica, left side: Secondary | ICD-10-CM

## 2022-03-16 MED ORDER — PREDNISONE 10 MG (21) PO TBPK: ORAL_TABLET | ORAL | 0 refills | Status: DC

## 2022-03-17 ENCOUNTER — Encounter: Payer: Self-pay | Admitting: Orthopedic Surgery

## 2022-03-17 NOTE — Progress Notes (Signed)
New Patient Visit ? ?Assessment: ?DAGAN TERRACINA is a 51 y.o. male with the following: ?1. Chronic bilateral low back pain with bilateral sciatica ? ?Plan: ?Thermon Leyland has progressively worsening lower back pain.  He does have radiating symptoms in the bilateral buttocks.  Medications help with his pain and symptoms, but it worsens throughout the day.  On radiographs, he does have anterolisthesis.  Based on his constellation of symptoms, as well as the presence of anterolisthesis on the radiographs, I recommended an MRI.  Once the MRI has been scheduled, have asked him to contact clinic for a follow-up appointment.  Pending the results, we will consider referral to neurosurgery, or for an image guided steroid injection.  He is in agreement with this plan.  Have also provided him with a prednisone Dosepak for his current symptoms. ? ? ?Follow-up: ?No follow-ups on file. ? ?Subjective: ? ?Chief Complaint  ?Patient presents with  ? Back Pain  ?  Pain worse in the past couple years, does well work for a living. Has been seeing a chiropractor sometimes twice a week and now the pain is coming back the next day.   ? ? ?History of Present Illness: ?HARTWELL BURKEEN is a 51 y.o. male who presents for evaluation of lower back pain.  He has had lower back pain for several years.  This has progressively worsened.  Is getting more more difficult for him to complete his tasks at work.  The pain starts in the lower back, and radiates distally into bilateral buttocks.  It progressively worsened throughout the day.  He does not like to take medications, but will take some Tylenol or naproxen as needed.  This helps somewhat with the pain, but does not provide any sustained relief.  He has been working with a Restaurant manager, fast food, and initially this was helpful.  However, he has recently been going twice weekly, and it is no longer helping. ? ? ?Review of Systems: ?No fevers or chills ?No numbness or tingling ?No chest pain ?No shortness of  breath ?No bowel or bladder dysfunction ?No GI distress ?No headaches ? ? ?Medical History: ? ?Past Medical History:  ?Diagnosis Date  ? Anxiety 07/15/2010  ? Qualifier: Diagnosis of  By: Percival Spanish, MD, Farrel Gordon    ? Dyslipidemia   ? HTN (hypertension)   ? Hypogonadism in male 08/12/2015  ? ? ?Past Surgical History:  ?Procedure Laterality Date  ? SHOULDER ARTHROSCOPY W/ ROTATOR CUFF REPAIR Right 04/2015  ? VASECTOMY  1992  ? Dr. Carin Primrose  ? ? ?Family History  ?Problem Relation Age of Onset  ? Heart attack Father   ?     in his 12s  ? Heart disease Father   ? Prostate cancer Paternal Great-grandfather   ? ?Social History  ? ?Tobacco Use  ? Smoking status: Never  ? Smokeless tobacco: Current  ?  Types: Chew  ? Tobacco comments:  ?  no cigarettes; occasionally uses smokeless tobacco  ?Vaping Use  ? Vaping Use: Never used  ?Substance Use Topics  ? Alcohol use: Yes  ?  Alcohol/week: 6.0 standard drinks  ?  Types: 6 Cans of beer per week  ? Drug use: No  ? ? ?No Known Allergies ? ?Current Meds  ?Medication Sig  ? predniSONE (STERAPRED UNI-PAK 21 TAB) 10 MG (21) TBPK tablet 10 mg DS 12 as directed  ? ? ?Objective: ?BP (!) 182/107   Pulse 85   Ht 5' 8.5" (1.74 m)  Wt 216 lb (98 kg)   BMI 32.36 kg/m?  ? ?Physical Exam: ? ?General: Alert and oriented. and No acute distress. ?Gait: Slow, steady gait. ? ?Diffuse tenderness in the lower back.  Negative straight leg raise bilaterally.  Sensation is intact throughout all dermatomal distributions bilaterally.  5/5 strength in bilateral lower extremities.  2+ patellar tendon reflexes.  2+ Achilles tendon reflexes. ? ?IMAGING: ?I personally ordered and reviewed the following images ? ?Standing lumbar x-rays were obtained in clinic today.  No acute injuries are noted.  Mild degenerative changes are noted throughout.  Mild loss of disc height at L5-S1.  There is anterolisthesis at L4-5. ? ?Impression: Lumbar spine x-ray with L4-5 anterolisthesis. ? ? ?New Medications:  ?Meds  ordered this encounter  ?Medications  ? predniSONE (STERAPRED UNI-PAK 21 TAB) 10 MG (21) TBPK tablet  ?  Sig: 10 mg DS 12 as directed  ?  Dispense:  48 tablet  ?  Refill:  0  ? ? ? ? ?Mordecai Rasmussen, MD ? ?03/17/2022 ?8:02 AM ? ? ?

## 2022-03-31 ENCOUNTER — Ambulatory Visit (HOSPITAL_COMMUNITY)
Admission: RE | Admit: 2022-03-31 | Discharge: 2022-03-31 | Disposition: A | Discharge status: Home / Self Care | Payer: 59 | Source: Ambulatory Visit | Attending: Orthopedic Surgery | Admitting: Orthopedic Surgery

## 2022-03-31 DIAGNOSIS — M5442 Lumbago with sciatica, left side: Secondary | ICD-10-CM | POA: Insufficient documentation

## 2022-03-31 DIAGNOSIS — M5441 Lumbago with sciatica, right side: Secondary | ICD-10-CM | POA: Diagnosis present

## 2022-03-31 DIAGNOSIS — G8929 Other chronic pain: Secondary | ICD-10-CM | POA: Insufficient documentation

## 2022-04-05 ENCOUNTER — Telehealth: Payer: Self-pay

## 2022-04-05 NOTE — Telephone Encounter (Signed)
Patient left message on voicemail stating he hasn't heard back about his MRI results. ?I looked at last office note and it states for him to set up a follow up appointment to go over MRI. ?I called him and he stated he was told that you would call him with his results. I told him I would send a message in regards to his results. ?His number is [phone] ? ? ? ?Please advise ?

## 2022-04-07 ENCOUNTER — Ambulatory Visit (INDEPENDENT_AMBULATORY_CARE_PROVIDER_SITE_OTHER): Payer: 59 | Admitting: Orthopedic Surgery

## 2022-04-07 DIAGNOSIS — G8929 Other chronic pain: Secondary | ICD-10-CM

## 2022-04-07 DIAGNOSIS — M5442 Lumbago with sciatica, left side: Secondary | ICD-10-CM

## 2022-04-07 DIAGNOSIS — M5441 Lumbago with sciatica, right side: Secondary | ICD-10-CM | POA: Diagnosis not present

## 2022-04-09 ENCOUNTER — Encounter: Payer: Self-pay | Admitting: Orthopedic Surgery

## 2022-04-09 NOTE — Progress Notes (Signed)
Orthopaedic Clinic Return - Virtual Telephone Visit   **Visit was conducted via telephone at the patient's request.  No vital signs or physical exam were completed.  All previous medical records were readily available during my conversation with the patient.  In total, I was on the phone with the patient for 15 minutes**   Location: Patient: Home Provider: Clinic  [phone] - 5-10 minutes  Assessment: Walter Wilkinson is a 51 y.o. male with the following: Low back pain, with radiating symptoms and anterolisthesis on MRI  Plan: I had an extensive discussion with the patient and his wife via telephone today.  He does have anterolisthesis, with nerve compression apparent on the MRI.  We discussed multiple treatment options, including medications, referral for injections or a referral to neurosurgery to discuss all potential treatment options.  After discussing this and answering questions, we will place referral for neurosurgery.  Follow-up in clinic as needed.   Follow-up: Return if symptoms worsen or fail to improve.   Subjective:  Chief Complaint  Patient presents with   Back Pain    Discussed MRI results    History of Present Illness: I connected with  [name] on 04/09/22 via telephone and verified that I am speaking with the correct person using two identifiers.   I discussed the limitations of evaluation and management by telemedicine. The patient expressed understanding and agreed to proceed.   Walter Wilkinson is a 51 y.o. male who presents with low back pain.  I saw him in clinic recently, and he has pain in the lower back, with radiating symptoms.  Radiographs demonstrated evidence of anterolisthesis.  He continues to have problems with his lower back.  His symptoms have not changed.  He has had an MRI, and would like to discuss the results.  Review of Systems: No fevers or chills + numbness and tingling No chest pain No shortness of breath No bowel or bladder  dysfunction No GI distress No headaches   Objective: No vital signs  Physical Exam:  Telephone consult - No exam   IMAGING: I personally ordered and reviewed the following images:  Lumbar spine MRI  IMPRESSION: 1. 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 2. 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 3. Advanced right foraminal impingement at L5-S1.   Oliver Barre, MD 04/09/2022 2:40 PM

## 2022-07-23 ENCOUNTER — Encounter: Payer: Self-pay | Admitting: Nurse Practitioner

## 2022-07-23 ENCOUNTER — Ambulatory Visit (INDEPENDENT_AMBULATORY_CARE_PROVIDER_SITE_OTHER): Payer: 59 | Admitting: Nurse Practitioner

## 2022-07-23 VITALS — BP 143/99 | HR 84 | Temp 97.4°F | Resp 20 | Ht 68.0 in | Wt 206.0 lb

## 2022-07-23 DIAGNOSIS — E782 Mixed hyperlipidemia: Secondary | ICD-10-CM | POA: Diagnosis not present

## 2022-07-23 DIAGNOSIS — N528 Other male erectile dysfunction: Secondary | ICD-10-CM

## 2022-07-23 DIAGNOSIS — R6882 Decreased libido: Secondary | ICD-10-CM

## 2022-07-23 DIAGNOSIS — I10 Essential (primary) hypertension: Secondary | ICD-10-CM | POA: Diagnosis not present

## 2022-07-23 DIAGNOSIS — Z0001 Encounter for general adult medical examination with abnormal findings: Secondary | ICD-10-CM | POA: Diagnosis not present

## 2022-07-23 DIAGNOSIS — Z0000 Encounter for general adult medical examination without abnormal findings: Secondary | ICD-10-CM

## 2022-07-23 MED ORDER — LISINOPRIL 40 MG PO TABS: 40.0000 mg | ORAL_TABLET | Freq: Every day | ORAL | 3 refills | Status: DC

## 2022-07-23 MED ORDER — TADALAFIL 10 MG PO TABS: 10.0000 mg | ORAL_TABLET | ORAL | 1 refills | Status: DC | PRN

## 2022-07-23 NOTE — Progress Notes (Signed)
New Patient Note  RE: Walter Wilkinson MRN: [id number] DOB: [birth year] Date of Office Visit: 07/23/2022  Chief Complaint: Annual Exam  History of Present Illness: .   Encounter for general adult medical examination Physical; Patient's last physical exam was 1 year ago .  Weight: not appropriate for height (BMI less than 27%) ;  Blood Pressure: abnormal (BP greater than 120/80) ;  Medical History: Patient history reviewed ; Family history reviewed ;  Allergies Reviewed: No change in current allergies ;  Medications Reviewed: Medications reviewed - no changes ;  Lipids: Normal lipid levels ; Labs completed results pending Smoking: Life-long non-smoker  Physical Activity: Exercises at least 3 times per week  Alcohol/Drug Use: Is a social-drinker ,No illicit drug use Patient is not afflicted from Stress Incontinence and Urge Incontinence  Safety: reviewed ; Patient wears a seat belt, has smoke detectors, has carbon monoxide detectors, practices appropriate gun safety, and wears sunscreen with extended sun exposure. Dental Care: Annual cleanings, brushes and flosses daily. Ophthalmology/Optometry: Annual visit.  Hearing loss: none Vision impairments: none    Pt presents for follow up of hypertension. Patient was diagnosed in 01/21/2009. The patient is tolerating the medication well without side effects. Compliance with treatment has been good; including taking medication as directed , maintains a healthy diet and regular exercise regimen , and following up as directed. Patient reports recent elevation of blood pressure readings without any changes to current lifestyle  Libido:  Patient is reporting decreased libido even with the use of Viagra 5 mg tablet by mouth as needed   Assessment and Plan: Hilman is a 51 y.o. male  establishing care with concerns of  ESSENTIAL HYPERTENSION, BENIGN Patient is new establishing care with elevated blood pressure, patient is currently on Zestril 20 mg  tablet daily. I increased his dose to 40 mg tablet daily, low sodium diet, exercise, weight loss, keeping a blood pressure log and follow up in 4 weeks unless BP is not at goal.  Patient verbalized understanding.  Hyperlipidemia Lipid panel completed results pending.  Erectile dysfunction Continue Viagra as needed dose increased to 10 mg tablet by mouth as needed.  Education provided to patient, patient verbalized understanding  Return in about 3 months (around 10/22/2022) for hypertension.   Diagnostics:   Past Medical History: Patient Active Problem List   Diagnosis Date Noted   Obesity (BMI 30.0-34.9) 09/19/2019   Insulin resistance 08/12/2015   Vitamin D deficiency 08/19/2014   Medication management 08/19/2014   Tobacco chew use 08/19/2014   Hyperlipidemia 01/21/2009   Erectile dysfunction 01/21/2009   ESSENTIAL HYPERTENSION, BENIGN 01/21/2009   Past Medical History:  Diagnosis Date   Anxiety 07/15/2010   Qualifier: Diagnosis of  By: Antoine Poche, MD, Gerrit Heck     Dyslipidemia    HTN (hypertension)    Hypogonadism in male 08/12/2015   Past Surgical History: Past Surgical History:  Procedure Laterality Date   SHOULDER ARTHROSCOPY W/ ROTATOR CUFF REPAIR Right 04/2015   VASECTOMY  1992   Dr. Retia Passe   Medication List:  Current Outpatient Medications  Medication Sig Dispense Refill   ASPIRIN 81 PO Take by mouth.     Cholecalciferol (VITAMIN D-3) 5000 UNITS TABS Take by mouth 2 (two) times daily.     lisinopril (ZESTRIL) 40 MG tablet Take 1 tablet (40 mg total) by mouth daily. 90 tablet 3   OVER THE COUNTER MEDICATION Super beets     tadalafil (CIALIS) 10 MG tablet Take 1 tablet (10  mg total) by mouth every other day as needed for erectile dysfunction. 10 tablet 1   No current facility-administered medications for this visit.   Allergies: No Known Allergies Social History: Social History   Socioeconomic History   Marital status: Married    Spouse name: Not  on file   Number of children: 1   Years of education: Not on file   Highest education level: Not on file  Occupational History   Occupation: self employed  Tobacco Use   Smoking status: Never   Smokeless tobacco: Current    Types: Chew   Tobacco comments:    no cigarettes; occasionally uses smokeless tobacco  Vaping Use   Vaping Use: Never used  Substance and Sexual Activity   Alcohol use: Yes    Alcohol/week: 24.0 standard drinks of alcohol    Types: 24 Cans of beer per week   Drug use: No   Sexual activity: Yes    Partners: Female    Birth control/protection: Surgical  Other Topics Concern   Not on file  Social History Narrative   Not on file   Social Determinants of Health   Financial Resource Strain: Not on file  Food Insecurity: Not on file  Transportation Needs: Not on file  Physical Activity: Not on file  Stress: Not on file  Social Connections: Not on file       Family History: Family History  Problem Relation Age of Onset   Heart attack Father        in his 60s   Heart disease Father    Prostate cancer Paternal Great-grandfather          Review of Systems  Constitutional: Negative.   HENT: Negative.    Eyes: Negative.   Respiratory: Negative.    Cardiovascular: Negative.   Endocrine: Negative for polyphagia and polyuria.  Genitourinary: Negative.   Musculoskeletal: Negative.   Skin: Negative.   All other systems reviewed and are negative.  Objective: BP (!) 143/99   Pulse 84   Temp (!) 97.4 F (36.3 C) (Temporal)   Resp 20   Ht 5\' 8"  (1.727 m)   Wt 206 lb (93.4 kg)   SpO2 98%   BMI 31.32 kg/m  Body mass index is 31.32 kg/m. Physical Exam Vitals reviewed.  Constitutional:      Appearance: Normal appearance. He is obese.  HENT:     Head: Normocephalic.     Right Ear: External ear normal. There is no impacted cerumen.     Left Ear: External ear normal. There is no impacted cerumen.     Nose: Nose normal. No congestion.      Mouth/Throat:     Mouth: Mucous membranes are moist.     Pharynx: Oropharynx is clear.  Eyes:     Conjunctiva/sclera: Conjunctivae normal.     Pupils: Pupils are equal, round, and reactive to light.  Cardiovascular:     Rate and Rhythm: Normal rate and regular rhythm.     Pulses: Normal pulses.     Heart sounds: Normal heart sounds.  Pulmonary:     Effort: Pulmonary effort is normal.     Breath sounds: Normal breath sounds.  Abdominal:     General: Bowel sounds are normal.  Musculoskeletal:        General: Normal range of motion.  Skin:    General: Skin is warm.     Findings: No erythema or rash.  Neurological:     General: No focal deficit present.  Mental Status: He is oriented to person, place, and time.  Psychiatric:        Mood and Affect: Mood normal.        Behavior: Behavior normal.    The plan was reviewed with the patient/family, and all questions/concerned were addressed.  It was my pleasure to see Spike today and participate in his care. Please feel free to contact me with any questions or concerns.  Sincerely,  Lynnell Chad NP [facility]

## 2022-07-23 NOTE — Patient Instructions (Addendum)
Health Maintenance, Male Adopting a healthy lifestyle and getting preventive care are important in promoting health and wellness. Ask your health care provider about: The right schedule for you to have regular tests and exams. Things you can do on your own to prevent diseases and keep yourself healthy. What should I know about diet, weight, and exercise? Eat a healthy diet  Eat a diet that includes plenty of vegetables, fruits, low-fat dairy products, and lean protein. Do not eat a lot of foods that are high in solid fats, added sugars, or sodium. Maintain a healthy weight Body mass index (BMI) is a measurement that can be used to identify possible weight problems. It estimates body fat based on height and weight. Your health care provider can help determine your BMI and help you achieve or maintain a healthy weight. Get regular exercise Get regular exercise. This is one of the most important things you can do for your health. Most adults should: Exercise for at least 150 minutes each week. The exercise should increase your heart rate and make you sweat (moderate-intensity exercise). Do strengthening exercises at least twice a week. This is in addition to the moderate-intensity exercise. Spend less time sitting. Even light physical activity can be beneficial. Watch cholesterol and blood lipids Have your blood tested for lipids and cholesterol at 51 years of age, then have this test every 5 years. You may need to have your cholesterol levels checked more often if: Your lipid or cholesterol levels are high. You are older than 51 years of age. You are at high risk for heart disease. What should I know about cancer screening? Many types of cancers can be detected early and may often be prevented. Depending on your health history and family history, you may need to have cancer screening at various ages. This may include screening for: Colorectal cancer. Prostate cancer. Skin cancer. Lung  cancer. What should I know about heart disease, diabetes, and high blood pressure? Blood pressure and heart disease High blood pressure causes heart disease and increases the risk of stroke. This is more likely to develop in people who have high blood pressure readings or are overweight. Talk with your health care provider about your target blood pressure readings. Have your blood pressure checked: Every 3-5 years if you are 18-39 years of age. Every year if you are 40 years old or older. If you are between the ages of 65 and 75 and are a current or former smoker, ask your health care provider if you should have a one-time screening for abdominal aortic aneurysm (AAA). Diabetes Have regular diabetes screenings. This checks your fasting blood sugar level. Have the screening done: Once every three years after age 45 if you are at a normal weight and have a low risk for diabetes. More often and at a younger age if you are overweight or have a high risk for diabetes. What should I know about preventing infection? Hepatitis B If you have a higher risk for hepatitis B, you should be screened for this virus. Talk with your health care provider to find out if you are at risk for hepatitis B infection. Hepatitis C Blood testing is recommended for: Everyone born from 1945 through 1965. Anyone with known risk factors for hepatitis C. Sexually transmitted infections (STIs) You should be screened each year for STIs, including gonorrhea and chlamydia, if: You are sexually active and are younger than 51 years of age. You are older than 51 years of age and your   health care provider tells you that you are at risk for this type of infection. Your sexual activity has changed since you were last screened, and you are at increased risk for chlamydia or gonorrhea. Ask your health care provider if you are at risk. Ask your health care provider about whether you are at high risk for HIV. Your health care provider  may recommend a prescription medicine to help prevent HIV infection. If you choose to take medicine to prevent HIV, you should first get tested for HIV. You should then be tested every 3 months for as long as you are taking the medicine. Follow these instructions at home: Alcohol use Do not drink alcohol if your health care provider tells you not to drink. If you drink alcohol: Limit how much you have to 0-2 drinks a day. Know how much alcohol is in your drink. In the [place] one drink equals one 12 oz bottle of beer (355 mL), one 5 oz glass of wine (148 mL), or one 1 oz glass of hard liquor (44 mL). Lifestyle Do not use any products that contain nicotine or tobacco. These products include cigarettes, chewing tobacco, and vaping devices, such as e-cigarettes. If you need help quitting, ask your health care provider. Do not use street drugs. Do not share needles. Ask your health care provider for help if you need support or information about quitting drugs. General instructions Schedule regular health, dental, and eye exams. Stay current with your vaccines. Tell your health care provider if: You often feel depressed. You have ever been abused or do not feel safe at home. Summary Adopting a healthy lifestyle and getting preventive care are important in promoting health and wellness. Follow your health care provider's instructions about healthy diet, exercising, and getting tested or screened for diseases. Follow your health care provider's instructions on monitoring your cholesterol and blood pressure. This information is not intended to replace advice given to you by your health care provider. Make sure you discuss any questions you have with your health care provider. Document Revised: 03/30/2021 Document Reviewed: 03/30/2021 Elsevier Patient Education  2023 [employer] Hypertension, Adult High blood pressure (hypertension) is when the force of blood pumping through the arteries is too  strong. The arteries are the blood vessels that carry blood from the heart throughout the body. Hypertension forces the heart to work harder to pump blood and may cause arteries to become narrow or stiff. Untreated or uncontrolled hypertension can lead to a heart attack, heart failure, a stroke, kidney disease, and other problems. A blood pressure reading consists of a higher number over a lower number. Ideally, your blood pressure should be below 120/80. The first ("top") number is called the systolic pressure. It is a measure of the pressure in your arteries as your heart beats. The second ("bottom") number is called the diastolic pressure. It is a measure of the pressure in your arteries as the heart relaxes. What are the causes? The exact cause of this condition is not known. There are some conditions that result in high blood pressure. What increases the risk? Certain factors may make you more likely to develop high blood pressure. Some of these risk factors are under your control, including: Smoking. Not getting enough exercise or physical activity. Being overweight. Having too much fat, sugar, calories, or salt (sodium) in your diet. Drinking too much alcohol. Other risk factors include: Having a personal history of heart disease, diabetes, high cholesterol, or kidney disease. Stress. Having a family  history of high blood pressure and high cholesterol. Having obstructive sleep apnea. Age. The risk increases with age. What are the signs or symptoms? High blood pressure may not cause symptoms. Very high blood pressure (hypertensive crisis) may cause: Headache. Fast or irregular heartbeats (palpitations). Shortness of breath. Nosebleed. Nausea and vomiting. Vision changes. Severe chest pain, dizziness, and seizures. How is this diagnosed? This condition is diagnosed by measuring your blood pressure while you are seated, with your arm resting on a flat surface, your legs uncrossed, and  your feet flat on the floor. The cuff of the blood pressure monitor will be placed directly against the skin of your upper arm at the level of your heart. Blood pressure should be measured at least twice using the same arm. Certain conditions can cause a difference in blood pressure between your right and left arms. If you have a high blood pressure reading during one visit or you have normal blood pressure with other risk factors, you may be asked to: Return on a different day to have your blood pressure checked again. Monitor your blood pressure at home for 1 week or longer. If you are diagnosed with hypertension, you may have other blood or imaging tests to help your health care provider understand your overall risk for other conditions. How is this treated? This condition is treated by making healthy lifestyle changes, such as eating healthy foods, exercising more, and reducing your alcohol intake. You may be referred for counseling on a healthy diet and physical activity. Your health care provider may prescribe medicine if lifestyle changes are not enough to get your blood pressure under control and if: Your systolic blood pressure is above 130. Your diastolic blood pressure is above 80. Your personal target blood pressure may vary depending on your medical conditions, your age, and other factors. Follow these instructions at home: Eating and drinking  Eat a diet that is high in fiber and potassium, and low in sodium, added sugar, and fat. An example of this eating plan is called the DASH diet. DASH stands for Dietary Approaches to Stop Hypertension. To eat this way: Eat plenty of fresh fruits and vegetables. Try to fill one half of your plate at each meal with fruits and vegetables. Eat whole grains, such as whole-wheat pasta, brown rice, or whole-grain bread. Fill about one fourth of your plate with whole grains. Eat or drink low-fat dairy products, such as skim milk or low-fat yogurt. Avoid  fatty cuts of meat, processed or cured meats, and poultry with skin. Fill about one fourth of your plate with lean proteins, such as fish, chicken without skin, beans, eggs, or tofu. Avoid pre-made and processed foods. These tend to be higher in sodium, added sugar, and fat. Reduce your daily sodium intake. Many people with hypertension should eat less than 1,500 mg of sodium a day. Do not drink alcohol if: Your health care provider tells you not to drink. You are pregnant, may be pregnant, or are planning to become pregnant. If you drink alcohol: Limit how much you have to: 0-1 drink a day for women. 0-2 drinks a day for men. Know how much alcohol is in your drink. In the [place] one drink equals one 12 oz bottle of beer (355 mL), one 5 oz glass of wine (148 mL), or one 1 oz glass of hard liquor (44 mL). Lifestyle  Work with your health care provider to maintain a healthy body weight or to lose weight. Ask what an ideal  weight is for you. Get at least 30 minutes of exercise that causes your heart to beat faster (aerobic exercise) most days of the week. Activities may include walking, swimming, or biking. Include exercise to strengthen your muscles (resistance exercise), such as Pilates or lifting weights, as part of your weekly exercise routine. Try to do these types of exercises for 30 minutes at least 3 days a week. Do not use any products that contain nicotine or tobacco. These products include cigarettes, chewing tobacco, and vaping devices, such as e-cigarettes. If you need help quitting, ask your health care provider. Monitor your blood pressure at home as told by your health care provider. Keep all follow-up visits. This is important. Medicines Take over-the-counter and prescription medicines only as told by your health care provider. Follow directions carefully. Blood pressure medicines must be taken as prescribed. Do not skip doses of blood pressure medicine. Doing this puts you at risk  for problems and can make the medicine less effective. Ask your health care provider about side effects or reactions to medicines that you should watch for. Contact a health care provider if you: Think you are having a reaction to a medicine you are taking. Have headaches that keep coming back (recurring). Feel dizzy. Have swelling in your ankles. Have trouble with your vision. Get help right away if you: Develop a severe headache or confusion. Have unusual weakness or numbness. Feel faint. Have severe pain in your chest or abdomen. Vomit repeatedly. Have trouble breathing. These symptoms may be an emergency. Get help right away. Call 911. Do not wait to see if the symptoms will go away. Do not drive yourself to the hospital. Summary Hypertension is when the force of blood pumping through your arteries is too strong. If this condition is not controlled, it may put you at risk for serious complications. Your personal target blood pressure may vary depending on your medical conditions, your age, and other factors. For most people, a normal blood pressure is less than 120/80. Hypertension is treated with lifestyle changes, medicines, or a combination of both. Lifestyle changes include losing weight, eating a healthy, low-sodium diet, exercising more, and limiting alcohol. This information is not intended to replace advice given to you by your health care provider. Make sure you discuss any questions you have with your health care provider. Document Revised: 09/15/2021 Document Reviewed: 09/15/2021 Elsevier Patient Education  2023 [employer].

## 2022-07-24 LAB — CMP14+EGFR
ALT: 25 IU/L (ref 0–44)
AST: 29 IU/L (ref 0–40)
Albumin/Globulin Ratio: 1.8 (ref 1.2–2.2)
Albumin: 4.7 g/dL (ref 4.1–5.1)
Alkaline Phosphatase: 82 IU/L (ref 44–121)
BUN/Creatinine Ratio: 9 (ref 9–20)
BUN: 8 mg/dL (ref 6–24)
Bilirubin Total: 0.8 mg/dL (ref 0.0–1.2)
CO2: 24 mmol/L (ref 20–29)
Calcium: 10.2 mg/dL (ref 8.7–10.2)
Chloride: 101 mmol/L (ref 96–106)
Creatinine, Ser: 0.92 mg/dL (ref 0.76–1.27)
Globulin, Total: 2.6 g/dL (ref 1.5–4.5)
Glucose: 101 mg/dL — ABNORMAL HIGH (ref 70–99)
Potassium: 5.1 mmol/L (ref 3.5–5.2)
Sodium: 142 mmol/L (ref 134–144)
Total Protein: 7.3 g/dL (ref 6.0–8.5)
eGFR: 101 mL/min/{1.73_m2} (ref >59)

## 2022-07-24 LAB — CBC WITH DIFFERENTIAL/PLATELET
Basophils Absolute: 0.1 10*3/uL (ref 0.0–0.2)
Basos: 1 %
EOS (ABSOLUTE): 0.1 10*3/uL (ref 0.0–0.4)
Eos: 2 %
Hematocrit: 49.1 % (ref 37.5–51.0)
Hemoglobin: 17.3 g/dL (ref 13.0–17.7)
Immature Grans (Abs): 0 10*3/uL (ref 0.0–0.1)
Immature Granulocytes: 0 %
Lymphocytes Absolute: 1.3 10*3/uL (ref 0.7–3.1)
Lymphs: 23 %
MCH: 33.7 pg — ABNORMAL HIGH (ref 26.6–33.0)
MCHC: 35.2 g/dL (ref 31.5–35.7)
MCV: 96 fL (ref 79–97)
Monocytes Absolute: 0.5 10*3/uL (ref 0.1–0.9)
Monocytes: 9 %
Neutrophils Absolute: 3.7 10*3/uL (ref 1.4–7.0)
Neutrophils: 65 %
Platelets: 246 10*3/uL (ref 150–450)
RBC: 5.13 x10E6/uL (ref 4.14–5.80)
RDW: 12.5 % (ref 11.6–15.4)
WBC: 5.7 10*3/uL (ref 3.4–10.8)

## 2022-07-24 LAB — LIPID PANEL
Chol/HDL Ratio: 5.4 ratio — ABNORMAL HIGH (ref 0.0–5.0)
Cholesterol, Total: 178 mg/dL (ref 100–199)
HDL: 33 mg/dL — ABNORMAL LOW (ref >39)
LDL Chol Calc (NIH): 92 mg/dL (ref 0–99)
Triglycerides: 315 mg/dL — ABNORMAL HIGH (ref 0–149)
VLDL Cholesterol Cal: 53 mg/dL — ABNORMAL HIGH (ref 5–40)

## 2022-07-24 LAB — TSH: TSH: 1.3 u[IU]/mL (ref 0.450–4.500)

## 2022-07-24 NOTE — Assessment & Plan Note (Signed)
Patient is new establishing care with elevated blood pressure, patient is currently on Zestril 20 mg tablet daily. I increased his dose to 40 mg tablet daily, low sodium diet, exercise, weight loss, keeping a blood pressure log and follow up in 4 weeks unless BP is not at goal.  Patient verbalized understanding.

## 2022-07-24 NOTE — Assessment & Plan Note (Signed)
Continue Viagra as needed dose increased to 10 mg tablet by mouth as needed.  Education provided to patient, patient verbalized understanding

## 2022-07-24 NOTE — Assessment & Plan Note (Signed)
Lipid panel completed results pending. 

## 2022-09-20 ENCOUNTER — Other Ambulatory Visit: Payer: Self-pay | Admitting: Nurse Practitioner

## 2022-09-20 DIAGNOSIS — R6882 Decreased libido: Secondary | ICD-10-CM

## 2022-10-12 ENCOUNTER — Encounter: Payer: Self-pay | Admitting: Nurse Practitioner

## 2022-10-19 ENCOUNTER — Encounter: Payer: 59 | Admitting: Nurse Practitioner

## 2022-10-25 ENCOUNTER — Ambulatory Visit: Payer: 59 | Admitting: Nurse Practitioner

## 2022-10-25 ENCOUNTER — Encounter: Payer: Self-pay | Admitting: Nurse Practitioner

## 2022-10-25 VITALS — BP 158/98 | HR 74 | Temp 98.7°F | Ht 68.0 in | Wt 188.0 lb

## 2022-10-25 DIAGNOSIS — E782 Mixed hyperlipidemia: Secondary | ICD-10-CM

## 2022-10-25 DIAGNOSIS — N529 Male erectile dysfunction, unspecified: Secondary | ICD-10-CM | POA: Diagnosis not present

## 2022-10-25 DIAGNOSIS — F339 Major depressive disorder, recurrent, unspecified: Secondary | ICD-10-CM

## 2022-10-25 DIAGNOSIS — F419 Anxiety disorder, unspecified: Secondary | ICD-10-CM | POA: Insufficient documentation

## 2022-10-25 DIAGNOSIS — I10 Essential (primary) hypertension: Secondary | ICD-10-CM

## 2022-10-25 MED ORDER — TADALAFIL 10 MG PO TABS: 10.0000 mg | ORAL_TABLET | Freq: Every day | ORAL | 2 refills | Status: DC | PRN

## 2022-10-25 MED ORDER — BUPROPION HCL ER (XL) 300 MG PO TB24: 300.0000 mg | ORAL_TABLET | Freq: Every day | ORAL | 0 refills | Status: DC

## 2022-10-25 MED ORDER — HYDROCHLOROTHIAZIDE 12.5 MG PO TABS: 12.5000 mg | ORAL_TABLET | Freq: Every day | ORAL | 3 refills | Status: DC

## 2022-10-25 NOTE — Patient Instructions (Signed)
Hypertension, Adult ?Hypertension is another name for high blood pressure. High blood pressure forces your heart to work harder to pump blood. This can cause problems over time. ?There are two numbers in a blood pressure reading. There is a top number (systolic) over a bottom number (diastolic). It is best to have a blood pressure that is below 120/80. ?What are the causes? ?The cause of this condition is not known. Some other conditions can lead to high blood pressure. ?What increases the risk? ?Some lifestyle factors can make you more likely to develop high blood pressure: ?Smoking. ?Not getting enough exercise or physical activity. ?Being overweight. ?Having too much fat, sugar, calories, or salt (sodium) in your diet. ?Drinking too much alcohol. ?Other risk factors include: ?Having any of these conditions: ?Heart disease. ?Diabetes. ?High cholesterol. ?Kidney disease. ?Obstructive sleep apnea. ?Having a family history of high blood pressure and high cholesterol. ?Age. The risk increases with age. ?Stress. ?What are the signs or symptoms? ?High blood pressure may not cause symptoms. Very high blood pressure (hypertensive crisis) may cause: ?Headache. ?Fast or uneven heartbeats (palpitations). ?Shortness of breath. ?Nosebleed. ?Vomiting or feeling like you may vomit (nauseous). ?Changes in how you see. ?Very bad chest pain. ?Feeling dizzy. ?Seizures. ?How is this treated? ?This condition is treated by making healthy lifestyle changes, such as: ?Eating healthy foods. ?Exercising more. ?Drinking less alcohol. ?Your doctor may prescribe medicine if lifestyle changes do not help enough and if: ?Your top number is above 130. ?Your bottom number is above 80. ?Your personal target blood pressure may vary. ?Follow these instructions at home: ?Eating and drinking ? ?If told, follow the DASH eating plan. To follow this plan: ?Fill one half of your plate at each meal with fruits and vegetables. ?Fill one fourth of your plate  at each meal with whole grains. Whole grains include whole-wheat pasta, brown rice, and whole-grain bread. ?Eat or drink low-fat dairy products, such as skim milk or low-fat yogurt. ?Fill one fourth of your plate at each meal with low-fat (lean) proteins. Low-fat proteins include fish, chicken without skin, eggs, beans, and tofu. ?Avoid fatty meat, cured and processed meat, or chicken with skin. ?Avoid pre-made or processed food. ?Limit the amount of salt in your diet to less than 1,500 mg each day. ?Do not drink alcohol if: ?Your doctor tells you not to drink. ?You are pregnant, may be pregnant, or are planning to become pregnant. ?If you drink alcohol: ?Limit how much you have to: ?0-1 drink a day for women. ?0-2 drinks a day for men. ?Know how much alcohol is in your drink. In the [place] one drink equals one 12 oz bottle of beer (355 mL), one 5 oz glass of wine (148 mL), or one 1? oz glass of hard liquor (44 mL). ?Lifestyle ? ?Work with your doctor to stay at a healthy weight or to lose weight. Ask your doctor what the best weight is for you. ?Get at least 30 minutes of exercise that causes your heart to beat faster (aerobic exercise) most days of the week. This may include walking, swimming, or biking. ?Get at least 30 minutes of exercise that strengthens your muscles (resistance exercise) at least 3 days a week. This may include lifting weights or doing Pilates. ?Do not smoke or use any products that contain nicotine or tobacco. If you need help quitting, ask your doctor. ?Check your blood pressure at home as told by your doctor. ?Keep all follow-up visits. ?Medicines ?Take over-the-counter and prescription medicines   only as told by your doctor. Follow directions carefully. ?Do not skip doses of blood pressure medicine. The medicine does not work as well if you skip doses. Skipping doses also puts you at risk for problems. ?Ask your doctor about side effects or reactions to medicines that you should watch  for. ?Contact a doctor if: ?You think you are having a reaction to the medicine you are taking. ?You have headaches that keep coming back. ?You feel dizzy. ?You have swelling in your ankles. ?You have trouble with your vision. ?Get help right away if: ?You get a very bad headache. ?You start to feel mixed up (confused). ?You feel weak or numb. ?You feel faint. ?You have very bad pain in your: ?Chest. ?Belly (abdomen). ?You vomit more than once. ?You have trouble breathing. ?These symptoms may be an emergency. Get help right away. Call 911. ?Do not wait to see if the symptoms will go away. ?Do not drive yourself to the hospital. ?Summary ?Hypertension is another name for high blood pressure. ?High blood pressure forces your heart to work harder to pump blood. ?For most people, a normal blood pressure is less than 120/80. ?Making healthy choices can help lower blood pressure. If your blood pressure does not get lower with healthy choices, you may need to take medicine. ?This information is not intended to replace advice given to you by your health care provider. Make sure you discuss any questions you have with your health care provider. ?Document Revised: 08/27/2021 Document Reviewed: 08/27/2021 ?Elsevier Patient Education ? 2023 [employer] ? ?

## 2022-10-25 NOTE — Assessment & Plan Note (Signed)
Anxiety not well-controlled.  Completed GAD-7.  Started patient on Wellbutrin 300 mg tablet by mouth daily.  Follow-up in 6 weeks.

## 2022-10-25 NOTE — Assessment & Plan Note (Signed)
Patient continues to report that his medication is not effective. Patient is still not taking 10 mg but breaking pill in half to stretch medication to last longer. I provided education to patient to take medication as prescribed, and follow up with unresolved symptoms. Cialis 10 mg tablet by mouth daily as needed for erectile dysfunction.  Rx refill sent to pharmacy.   Labs completed: testosterone results pending. Follow up with worsening symptoms

## 2022-10-25 NOTE — Progress Notes (Signed)
Established Patient Office Visit  Subjective   Patient ID: Walter Wilkinson, male    DOB: 07-20-1971  Age: 51 y.o. MRN: [id number]  Chief Complaint  Patient presents with   Medical Management of Chronic Issues    3 month   Anxiety    This has been going on a while , has gotten worse with work over the last year     Anxiety Presents for follow-up visit. Symptoms include decreased concentration, depressed mood, irritability, nervous/anxious behavior and restlessness. Patient reports no suicidal ideas. Symptoms occur constantly.    Hypertension This is a chronic problem. The problem has been gradually worsening since onset. The problem is uncontrolled. Associated symptoms include anxiety. Risk factors for coronary artery disease include male gender. Past treatments include ACE inhibitors. The current treatment provides no improvement. There are no compliance problems.       10/25/2022    8:12 AM  GAD 7 : Generalized Anxiety Score  Nervous, Anxious, on Edge 3  Control/stop worrying 3  Worry too much - different things 3  Trouble relaxing 3  Restless 3  Easily annoyed or irritable 3  Afraid - awful might happen 0  Total GAD 7 Score 18      Flowsheet Row Office Visit from 10/25/2022 in [facility]  PHQ-9 Total Score 15        Patient Active Problem List   Diagnosis Date Noted   Anxiety 10/25/2022   Depression, recurrent (Walter Wilkinson) 10/25/2022   Obesity (BMI 30.0-34.9) 09/19/2019   Insulin resistance 08/12/2015   Vitamin D deficiency 08/19/2014   Medication management 08/19/2014   Tobacco chew use 08/19/2014   Hyperlipidemia 01/21/2009   Erectile dysfunction 01/21/2009   Essential hypertension, benign 01/21/2009   Past Medical History:  Diagnosis Date   Anxiety 07/15/2010   Qualifier: Diagnosis of  By: Percival Spanish, MD, Farrel Gordon     Dyslipidemia    HTN (hypertension)    Hypogonadism in male 08/12/2015   Past Surgical History:  Procedure Laterality  Date   SHOULDER ARTHROSCOPY W/ ROTATOR CUFF REPAIR Right 04/2015   VASECTOMY  1992   Dr. Carin Primrose   Social History   Tobacco Use   Smoking status: Never   Smokeless tobacco: Current    Types: Chew   Tobacco comments:    no cigarettes; occasionally uses smokeless tobacco  Vaping Use   Vaping Use: Never used  Substance Use Topics   Alcohol use: Yes    Alcohol/week: 24.0 standard drinks of alcohol    Types: 24 Cans of beer per week   Drug use: No   Social History   Socioeconomic History   Marital status: Married    Spouse name: Not on file   Number of children: 1   Years of education: Not on file   Highest education level: Not on file  Occupational History   Occupation: self employed  Tobacco Use   Smoking status: Never   Smokeless tobacco: Current    Types: Chew   Tobacco comments:    no cigarettes; occasionally uses smokeless tobacco  Vaping Use   Vaping Use: Never used  Substance and Sexual Activity   Alcohol use: Yes    Alcohol/week: 24.0 standard drinks of alcohol    Types: 24 Cans of beer per week   Drug use: No   Sexual activity: Yes    Partners: Female    Birth control/protection: Surgical  Other Topics Concern   Not on file  Social History  Narrative   Not on file   Social Determinants of Health   Financial Resource Strain: Not on file  Food Insecurity: Not on file  Transportation Needs: Not on file  Physical Activity: Not on file  Stress: Not on file  Social Connections: Not on file  Intimate Partner Violence: Not on file   Family Status  Relation Name Status   Father  Deceased   Mother  Deceased   Sister  Alive   Brother  Alive   Brother  Alive   MGM  Deceased   MGF  Deceased   PGM  Deceased   PGF  Deceased   Son  Alive   Paternal GGF  Deceased   Family History  Problem Relation Age of Onset   Heart attack Father        in his 69s   Heart disease Father    Prostate cancer Paternal Great-grandfather    No Known Allergies     Review of Systems  Constitutional:  Positive for irritability.  HENT: Negative.    Eyes: Negative.   Respiratory: Negative.    Cardiovascular: Negative.   Genitourinary: Negative.   Musculoskeletal: Negative.   Skin: Negative.   Neurological: Negative.   Psychiatric/Behavioral:  Positive for decreased concentration and depression. Negative for suicidal ideas. The patient is nervous/anxious.   All other systems reviewed and are negative.     Objective:     BP (!) 158/98   Pulse 74   Temp 98.7 F (37.1 C)   Ht _0  (1.727 m)   Wt 188 lb (85.3 kg)   SpO2 99%   BMI 28.59 kg/m  BP Readings from Last 3 Encounters:  10/25/22 (!) 158/98  07/23/22 (!) 143/99  03/16/22 (!) 182/107   Wt Readings from Last 3 Encounters:  10/25/22 188 lb (85.3 kg)  07/23/22 206 lb (93.4 kg)  03/16/22 216 lb (98 kg)      Physical Exam Vitals and nursing note reviewed.  Constitutional:      Appearance: Normal appearance.  HENT:     Head: Normocephalic.     Right Ear: External ear normal.     Left Ear: External ear normal.     Nose: Nose normal.     Mouth/Throat:     Mouth: Mucous membranes are moist.     Pharynx: Oropharynx is clear.  Eyes:     Conjunctiva/sclera: Conjunctivae normal.     Pupils: Pupils are equal, round, and reactive to light.  Cardiovascular:     Rate and Rhythm: Normal rate.     Pulses: Normal pulses.     Heart sounds: Normal heart sounds.  Pulmonary:     Effort: Pulmonary effort is normal.     Breath sounds: Normal breath sounds.  Abdominal:     General: Bowel sounds are normal.  Musculoskeletal:        General: Normal range of motion.  Skin:    General: Skin is warm.  Neurological:     General: No focal deficit present.     Mental Status: He is oriented to person, place, and time.  Psychiatric:        Mood and Affect: Mood is anxious.        Behavior: Behavior normal.      No results found for any visits on 10/25/22.  Last CBC Lab Results   Component Value Date   WBC 5.7 07/23/2022   HGB 17.3 07/23/2022   HCT 49.1 07/23/2022   MCV 96 07/23/2022  MCH 33.7 (H) 07/23/2022   RDW 12.5 07/23/2022   PLT 246 16/08/9603   Last metabolic panel Lab Results  Component Value Date   GLUCOSE 101 (H) 07/23/2022   NA 142 07/23/2022   K 5.1 07/23/2022   CL 101 07/23/2022   CO2 24 07/23/2022   BUN 8 07/23/2022   CREATININE 0.92 07/23/2022   EGFR 101 07/23/2022   CALCIUM 10.2 07/23/2022   PROT 7.3 07/23/2022   ALBUMIN 4.7 07/23/2022   LABGLOB 2.6 07/23/2022   AGRATIO 1.8 07/23/2022   BILITOT 0.8 07/23/2022   ALKPHOS 82 07/23/2022   AST 29 07/23/2022   ALT 25 07/23/2022   Last lipids Lab Results  Component Value Date   CHOL 178 07/23/2022   HDL 33 (L) 07/23/2022   LDLCALC 92 07/23/2022   TRIG 315 (H) 07/23/2022   CHOLHDL 5.4 (H) 07/23/2022   Last hemoglobin A1c Lab Results  Component Value Date   HGBA1C 5.5 10/19/2021   Last thyroid functions Lab Results  Component Value Date   TSH 1.300 07/23/2022   Last vitamin D Lab Results  Component Value Date   VD25OH 29 (L) 02/02/2022   Last vitamin B12 and Folate Lab Results  Component Value Date   VITAMINB12 446 05/08/2014      The 10-year ASCVD risk score (Arnett DK, et al., 2019) is: 7.7%    Assessment & Plan:   Problem List Items Addressed This Visit       Cardiovascular and Mediastinum   Essential hypertension, benign - Primary    Patients blood pressure is not controlled, patient was last seen in September and was unable to follow up in 4 weeks as scheduled. Education provided to patient on the importance of maintaining and controlling blood pressure. Hydrochlorothiazide 12. 5 mg tablet 1 tablet by mouth added to daily regimen, daily blood pressure log and follow up in 2 weeks .   Patient has lost a total of 18 lbs in the past 8-10 weeks      Relevant Medications   hydrochlorothiazide (HYDRODIURIL) 12.5 MG tablet   tadalafil (CIALIS) 10 MG tablet    Other Relevant Orders   CBC with Differential   CMP14+EGFR   Lipid Panel   TSH     Other   Hyperlipidemia   Relevant Medications   hydrochlorothiazide (HYDRODIURIL) 12.5 MG tablet   tadalafil (CIALIS) 10 MG tablet   Erectile dysfunction    Patient continues to report that his medication is not effective. Patient is still not taking 10 mg but breaking pill in half to stretch medication to last longer. I provided education to patient to take medication as prescribed, and follow up with unresolved symptoms. Cialis 10 mg tablet by mouth daily as needed for erectile dysfunction.  Rx refill sent to pharmacy.   Labs completed: testosterone results pending. Follow up with worsening symptoms       Relevant Medications   tadalafil (CIALIS) 10 MG tablet   Other Relevant Orders   Testosterone,Free and Total   Anxiety    Anxiety not well-controlled.  Completed GAD-7.  Started patient on Wellbutrin 300 mg tablet by mouth daily.  Follow-up in 6 weeks.      Relevant Medications   buPROPion (WELLBUTRIN XL) 300 MG 24 hr tablet   Depression, recurrent ([place])    Uncontrolled /untreated depression. Completed phq-9, patient is reporting increased stress from job.  Started patient Wellbutrin 300 mg tablet by mouth daily.  Follow-up in 6 weeks.  Relevant Medications   buPROPion (WELLBUTRIN XL) 300 MG 24 hr tablet    Return in about 3 months (around 01/24/2023) for chronic disease management.    Ivy Lynn, NP

## 2022-10-25 NOTE — Assessment & Plan Note (Signed)
Uncontrolled /untreated depression. Completed phq-9, patient is reporting increased stress from job.  Started patient Wellbutrin 300 mg tablet by mouth daily.  Follow-up in 6 weeks.

## 2022-10-25 NOTE — Assessment & Plan Note (Signed)
Patients blood pressure is not controlled, patient was last seen in September and was unable to follow up in 4 weeks as scheduled. Education provided to patient on the importance of maintaining and controlling blood pressure. Hydrochlorothiazide 12. 5 mg tablet 1 tablet by mouth added to daily regimen, daily blood pressure log and follow up in 2 weeks .   Patient has lost a total of 18 lbs in the past 8-10 weeks

## 2022-10-28 LAB — CBC WITH DIFFERENTIAL/PLATELET
Basophils Absolute: 0.1 10*3/uL (ref 0.0–0.2)
Basos: 2 %
EOS (ABSOLUTE): 0.2 10*3/uL (ref 0.0–0.4)
Eos: 3 %
Hematocrit: 47.3 % (ref 37.5–51.0)
Hemoglobin: 16.2 g/dL (ref 13.0–17.7)
Immature Grans (Abs): 0 10*3/uL (ref 0.0–0.1)
Immature Granulocytes: 0 %
Lymphocytes Absolute: 1.1 10*3/uL (ref 0.7–3.1)
Lymphs: 23 %
MCH: 32.6 pg (ref 26.6–33.0)
MCHC: 34.2 g/dL (ref 31.5–35.7)
MCV: 95 fL (ref 79–97)
Monocytes Absolute: 0.4 10*3/uL (ref 0.1–0.9)
Monocytes: 9 %
Neutrophils Absolute: 3.2 10*3/uL (ref 1.4–7.0)
Neutrophils: 63 %
Platelets: 223 10*3/uL (ref 150–450)
RBC: 4.97 x10E6/uL (ref 4.14–5.80)
RDW: 12.4 % (ref 11.6–15.4)
WBC: 5 10*3/uL (ref 3.4–10.8)

## 2022-10-28 LAB — LIPID PANEL
Chol/HDL Ratio: 3.8 ratio (ref 0.0–5.0)
Cholesterol, Total: 141 mg/dL (ref 100–199)
HDL: 37 mg/dL — ABNORMAL LOW (ref >39)
LDL Chol Calc (NIH): 84 mg/dL (ref 0–99)
Triglycerides: 109 mg/dL (ref 0–149)
VLDL Cholesterol Cal: 20 mg/dL (ref 5–40)

## 2022-10-28 LAB — CMP14+EGFR
ALT: 16 IU/L (ref 0–44)
AST: 19 IU/L (ref 0–40)
Albumin/Globulin Ratio: 2 (ref 1.2–2.2)
Albumin: 4.5 g/dL (ref 4.1–5.1)
Alkaline Phosphatase: 70 IU/L (ref 44–121)
BUN/Creatinine Ratio: 11 (ref 9–20)
BUN: 11 mg/dL (ref 6–24)
Bilirubin Total: 1 mg/dL (ref 0.0–1.2)
CO2: 25 mmol/L (ref 20–29)
Calcium: 9.7 mg/dL (ref 8.7–10.2)
Chloride: 101 mmol/L (ref 96–106)
Creatinine, Ser: 1.01 mg/dL (ref 0.76–1.27)
Globulin, Total: 2.2 g/dL (ref 1.5–4.5)
Glucose: 90 mg/dL (ref 70–99)
Potassium: 4.6 mmol/L (ref 3.5–5.2)
Sodium: 141 mmol/L (ref 134–144)
Total Protein: 6.7 g/dL (ref 6.0–8.5)
eGFR: 91 mL/min/{1.73_m2} (ref >59)

## 2022-10-28 LAB — TESTOSTERONE,FREE AND TOTAL
Testosterone, Free: 3.7 pg/mL — ABNORMAL LOW (ref 7.2–24.0)
Testosterone: 300 ng/dL (ref 264–916)

## 2022-10-28 LAB — TSH: TSH: 1.85 u[IU]/mL (ref 0.450–4.500)

## 2022-11-16 ENCOUNTER — Other Ambulatory Visit: Payer: Self-pay | Admitting: Nurse Practitioner

## 2022-11-16 MED ORDER — HYDROCHLOROTHIAZIDE 12.5 MG PO TABS: 25.0000 mg | ORAL_TABLET | Freq: Every day | ORAL | 1 refills | Status: DC

## 2022-11-16 NOTE — Progress Notes (Signed)
I called patient to notify him that his blood pressure is not controlled, I increased his hydrochlorothiazide to 25 mg tablet by mouth daily, follow up in 2 weeks. If BP is still not controlled, I will add a calcium channel blocker. Patient verbalized understanding.

## 2022-12-07 ENCOUNTER — Ambulatory Visit: Payer: 59 | Admitting: Nurse Practitioner

## 2022-12-08 ENCOUNTER — Ambulatory Visit: Payer: 59 | Admitting: Family Medicine

## 2022-12-08 ENCOUNTER — Encounter: Payer: Self-pay | Admitting: Family Medicine

## 2022-12-08 VITALS — BP 124/79 | HR 78 | Temp 95.6°F | Ht 68.0 in | Wt 194.4 lb

## 2022-12-08 DIAGNOSIS — F419 Anxiety disorder, unspecified: Secondary | ICD-10-CM

## 2022-12-08 DIAGNOSIS — N521 Erectile dysfunction due to diseases classified elsewhere: Secondary | ICD-10-CM | POA: Diagnosis not present

## 2022-12-08 DIAGNOSIS — F339 Major depressive disorder, recurrent, unspecified: Secondary | ICD-10-CM

## 2022-12-08 DIAGNOSIS — I10 Essential (primary) hypertension: Secondary | ICD-10-CM | POA: Diagnosis not present

## 2022-12-08 MED ORDER — TADALAFIL 10 MG PO TABS: 10.0000 mg | ORAL_TABLET | Freq: Every day | ORAL | 2 refills | Status: DC | PRN

## 2022-12-08 NOTE — Progress Notes (Signed)
Subjective:  Patient ID: Walter Wilkinson, male    DOB: Feb 10, 1971, 52 y.o.   MRN: [id number]  Patient Care Team: Sonny Masters, FNP as PCP - General (Family Medicine)   Chief Complaint:  Anxiety and Depression (6 week follow up- patient states it is the same )   HPI: Walter Wilkinson is a 52 y.o. male presenting on 12/08/2022 for Anxiety and Depression (6 week follow up- patient states it is the same )   1. ESSENTIAL HYPERTENSION, BENIGN HCTZ dosing was increased to 25 mg at last visit. Has been tolerating well. Denies headaches, chest pain, palpitations, leg swelling, or visual changes. Does report intermittent dizziness when he stands up fast. This is not frequent but has been happening.   2. Depression, recurrent ([facility]) 3. Anxiety Feels symptoms are well controlled on current regimen and does not wish to change. Denies adverse side effects.     12/08/2022    8:28 AM 10/25/2022    8:11 AM 01/25/2020    9:49 AM 09/24/2019   10:05 PM  Depression screen PHQ 2/9  Decreased Interest 2 3 0 0  Down, Depressed, Hopeless 0 3 0 0  PHQ - 2 Score 2 6 0 0  Altered sleeping 0 3    Tired, decreased energy 3 0    Change in appetite 1 0    Feeling bad or failure about yourself  0 3    Trouble concentrating 3 3    Moving slowly or fidgety/restless 0 0    Suicidal thoughts 0 0    PHQ-9 Score 9 15    Difficult doing work/chores Somewhat difficult Very difficult        12/08/2022    8:29 AM 10/25/2022    8:12 AM  GAD 7 : Generalized Anxiety Score  Nervous, Anxious, on Edge 0 3  Control/stop worrying 0 3  Worry too much - different things 0 3  Trouble relaxing 2 3  Restless 3 3  Easily annoyed or irritable 3 3  Afraid - awful might happen 0 0  Total GAD 7 Score 8 18  Anxiety Difficulty Somewhat difficult     4. Erectile dysfunction due to diseases classified elsewhere Currently taking Cialis 10 mg as needed for ED. States this is minimally beneficial. Unable to sustain an erection. No  adverse side effects from regimen.      Relevant past medical, surgical, family, and social history reviewed and updated as indicated.  Allergies and medications reviewed and updated. Data reviewed: Chart in Epic.   Past Medical History:  Diagnosis Date   Anxiety 07/15/2010   Qualifier: Diagnosis of  By: Antoine Poche, MD, Gerrit Heck     Dyslipidemia    HTN (hypertension)    Hypogonadism in male 08/12/2015    Past Surgical History:  Procedure Laterality Date   SHOULDER ARTHROSCOPY W/ ROTATOR CUFF REPAIR Right 04/2015   VASECTOMY  1992   Dr. Retia Passe    Social History   Socioeconomic History   Marital status: Married    Spouse name: Not on file   Number of children: 1   Years of education: Not on file   Highest education level: Not on file  Occupational History   Occupation: self employed  Tobacco Use   Smoking status: Never   Smokeless tobacco: Current    Types: Chew   Tobacco comments:    no cigarettes; occasionally uses smokeless tobacco  Vaping Use   Vaping Use: Never used  Substance and Sexual Activity   Alcohol use: Yes    Alcohol/week: 24.0 standard drinks of alcohol    Types: 24 Cans of beer per week   Drug use: No   Sexual activity: Yes    Partners: Female    Birth control/protection: Surgical  Other Topics Concern   Not on file  Social History Narrative   Not on file   Social Determinants of Health   Financial Resource Strain: Not on file  Food Insecurity: Not on file  Transportation Needs: Not on file  Physical Activity: Not on file  Stress: Not on file  Social Connections: Not on file  Intimate Partner Violence: Not on file    Outpatient Encounter Medications as of 12/08/2022  Medication Sig   ASPIRIN 81 PO Take by mouth.   buPROPion (WELLBUTRIN XL) 300 MG 24 hr tablet Take 1 tablet (300 mg total) by mouth daily.   Cholecalciferol (VITAMIN D-3) 5000 UNITS TABS Take by mouth 2 (two) times daily.   hydrochlorothiazide (HYDRODIURIL) 12.5 MG  tablet Take 2 tablets (25 mg total) by mouth daily.   lisinopril (ZESTRIL) 40 MG tablet Take 1 tablet (40 mg total) by mouth daily.   OVER THE COUNTER MEDICATION Super beets   [DISCONTINUED] tadalafil (CIALIS) 10 MG tablet Take 1 tablet (10 mg total) by mouth daily as needed for erectile dysfunction.   tadalafil (CIALIS) 10 MG tablet Take 1 tablet (10 mg total) by mouth daily as needed for erectile dysfunction.   No facility-administered encounter medications on file as of 12/08/2022.    No Known Allergies  Review of Systems  Constitutional:  Positive for fatigue. Negative for activity change, appetite change, chills, diaphoresis and fever.  HENT: Negative.    Eyes: Negative.  Negative for photophobia and visual disturbance.  Respiratory:  Negative for cough, chest tightness and shortness of breath.   Cardiovascular:  Negative for chest pain, palpitations and leg swelling.  Gastrointestinal:  Negative for abdominal pain, blood in stool, constipation, diarrhea, nausea and vomiting.  Endocrine: Negative.  Negative for polydipsia, polyphagia and polyuria.  Genitourinary:  Negative for decreased urine volume, difficulty urinating, dysuria, frequency and urgency.       ED  Musculoskeletal:  Negative for arthralgias and myalgias.  Skin: Negative.   Allergic/Immunologic: Negative.   Neurological:  Negative for dizziness, tremors, seizures, syncope, facial asymmetry, speech difficulty, weakness, light-headedness, numbness and headaches.  Hematological: Negative.   Psychiatric/Behavioral:  Positive for agitation and decreased concentration. Negative for behavioral problems, confusion, dysphoric mood, hallucinations, self-injury, sleep disturbance and suicidal ideas. The patient is nervous/anxious and is hyperactive.   All other systems reviewed and are negative.       Objective:  BP 124/79   Pulse 78   Temp (!) 95.6 F (35.3 C) (Temporal)   Ht 5\' 8"  (1.727 m)   Wt 194 lb 6.4 oz (88.2 kg)    BMI 29.56 kg/m    Wt Readings from Last 3 Encounters:  12/08/22 194 lb 6.4 oz (88.2 kg)  10/25/22 188 lb (85.3 kg)  07/23/22 206 lb (93.4 kg)    Physical Exam Vitals and nursing note reviewed.  Constitutional:      General: He is not in acute distress.    Appearance: Normal appearance. He is well-developed and well-groomed. He is obese. He is not ill-appearing, toxic-appearing or diaphoretic.  HENT:     Head: Normocephalic and atraumatic.     Jaw: There is normal jaw occlusion.     Right Ear: Hearing  normal.     Left Ear: Hearing normal.     Nose: Nose normal.     Mouth/Throat:     Lips: Pink.     Mouth: Mucous membranes are moist.     Pharynx: Oropharynx is clear. Uvula midline.  Eyes:     General: Lids are normal.     Conjunctiva/sclera: Conjunctivae normal.     Pupils: Pupils are equal, round, and reactive to light.  Neck:     Thyroid: No thyroid mass, thyromegaly or thyroid tenderness.     Vascular: No carotid bruit or JVD.     Trachea: Trachea and phonation normal.  Cardiovascular:     Rate and Rhythm: Normal rate and regular rhythm.     Chest Wall: PMI is not displaced.     Pulses: Normal pulses.     Heart sounds: Normal heart sounds. No murmur heard.    No friction rub. No gallop.  Pulmonary:     Effort: Pulmonary effort is normal. No respiratory distress.     Breath sounds: Normal breath sounds. No wheezing.  Abdominal:     General: There is no abdominal bruit.     Palpations: Abdomen is soft. There is no hepatomegaly or splenomegaly.  Musculoskeletal:        General: Normal range of motion.     Cervical back: Normal range of motion and neck supple.     Right lower leg: No edema.     Left lower leg: No edema.  Lymphadenopathy:     Cervical: No cervical adenopathy.  Skin:    General: Skin is warm and dry.     Capillary Refill: Capillary refill takes less than 2 seconds.     Coloration: Skin is not cyanotic, jaundiced or pale.     Findings: No rash.   Neurological:     General: No focal deficit present.     Mental Status: He is alert and oriented to person, place, and time.     Sensory: Sensation is intact.     Motor: Motor function is intact.     Coordination: Coordination is intact.     Gait: Gait is intact.     Deep Tendon Reflexes: Reflexes are normal and symmetric.  Psychiatric:        Attention and Perception: Attention and perception normal.        Mood and Affect: Affect normal. Mood is anxious.        Speech: Speech normal.        Behavior: Behavior normal. Behavior is cooperative.        Thought Content: Thought content normal.        Cognition and Memory: Cognition and memory normal.        Judgment: Judgment normal.     Results for orders placed or performed in visit on 10/25/22  CBC with Differential  Result Value Ref Range   WBC 5.0 3.4 - 10.8 x10E3/uL   RBC 4.97 4.14 - 5.80 x10E6/uL   Hemoglobin 16.2 13.0 - 17.7 g/dL   Hematocrit 47.3 37.5 - 51.0 %   MCV 95 79 - 97 fL   MCH 32.6 26.6 - 33.0 pg   MCHC 34.2 31.5 - 35.7 g/dL   RDW 12.4 11.6 - 15.4 %   Platelets 223 150 - 450 x10E3/uL   Neutrophils 63 Not Estab. %   Lymphs 23 Not Estab. %   Monocytes 9 Not Estab. %   Eos 3 Not Estab. %   Basos 2 Not  Estab. %   Neutrophils Absolute 3.2 1.4 - 7.0 x10E3/uL   Lymphocytes Absolute 1.1 0.7 - 3.1 x10E3/uL   Monocytes Absolute 0.4 0.1 - 0.9 x10E3/uL   EOS (ABSOLUTE) 0.2 0.0 - 0.4 x10E3/uL   Basophils Absolute 0.1 0.0 - 0.2 x10E3/uL   Immature Granulocytes 0 Not Estab. %   Immature Grans (Abs) 0.0 0.0 - 0.1 x10E3/uL  CMP14+EGFR  Result Value Ref Range   Glucose 90 70 - 99 mg/dL   BUN 11 6 - 24 mg/dL   Creatinine, Ser 1.61 0.76 - 1.27 mg/dL   eGFR 91 >09 UE/AVW/0.98   BUN/Creatinine Ratio 11 9 - 20   Sodium 141 134 - 144 mmol/L   Potassium 4.6 3.5 - 5.2 mmol/L   Chloride 101 96 - 106 mmol/L   CO2 25 20 - 29 mmol/L   Calcium 9.7 8.7 - 10.2 mg/dL   Total Protein 6.7 6.0 - 8.5 g/dL   Albumin 4.5 4.1 - 5.1  g/dL   Globulin, Total 2.2 1.5 - 4.5 g/dL   Albumin/Globulin Ratio 2.0 1.2 - 2.2   Bilirubin Total 1.0 0.0 - 1.2 mg/dL   Alkaline Phosphatase 70 44 - 121 IU/L   AST 19 0 - 40 IU/L   ALT 16 0 - 44 IU/L  Lipid Panel  Result Value Ref Range   Cholesterol, Total 141 100 - 199 mg/dL   Triglycerides 119 0 - 149 mg/dL   HDL 37 (L) >14 mg/dL   VLDL Cholesterol Cal 20 5 - 40 mg/dL   LDL Chol Calc (NIH) 84 0 - 99 mg/dL   Chol/HDL Ratio 3.8 0.0 - 5.0 ratio  Testosterone,Free and Total  Result Value Ref Range   Testosterone 300 264 - 916 ng/dL   Testosterone, Free 3.7 (L) 7.2 - 24.0 pg/mL  TSH  Result Value Ref Range   TSH 1.850 0.450 - 4.500 uIU/mL       Pertinent labs & imaging results that were available during my care of the patient were reviewed by me and considered in my medical decision making.  Assessment & Plan:  K[place] was seen today for anxiety and depression.  Diagnoses and all orders for this visit:  ESSENTIAL HYPERTENSION, BENIGN BP well controlled. Changes were not made in regimen today. Goal BP is 130/80. Pt aware to report any persistent high or low readings. DASH diet and exercise encouraged. Exercise at least 150 minutes per week and increase as tolerated. Goal BMI > 25. Stress management encouraged. Avoid nicotine and tobacco product use. Avoid excessive alcohol and NSAID's. Avoid more than 2000 mg of sodium daily. Medications as prescribed. Follow up as scheduled.  -     BMP8+EGFR -     CBC with Differential/Platelet -     Thyroid Panel With TSH  Depression, recurrent ([facility]) Anxiety Well controlled on current regimen. Will continue.  -     BMP8+EGFR -     CBC with Differential/Platelet -     Thyroid Panel With TSH  Erectile dysfunction due to diseases classified elsewhere Doing ok on below, not as beneficial as it used to be. Will trial doubling dose to see if beneficial, if so, will send in new dosing.  -     tadalafil (CIALIS) 10 MG tablet; Take 1 tablet (10  mg total) by mouth daily as needed for erectile dysfunction.     Continue all other maintenance medications.  Follow up plan: Return if symptoms worsen or fail to improve.   Continue healthy lifestyle  choices, including diet (rich in fruits, vegetables, and lean proteins, and low in salt and simple carbohydrates) and exercise (at least 30 minutes of moderate physical activity daily).  Educational handout given for depression  The above assessment and management plan was discussed with the patient. The patient verbalized understanding of and has agreed to the management plan. Patient is aware to call the clinic if they develop any new symptoms or if symptoms persist or worsen. Patient is aware when to return to the clinic for a follow-up visit. Patient educated on when it is appropriate to go to the emergency department.   Kari Baars, FNP-C [facility] [phone]

## 2022-12-08 NOTE — Patient Instructions (Signed)

## 2022-12-09 LAB — CBC WITH DIFFERENTIAL/PLATELET
Basophils Absolute: 0.1 10*3/uL (ref 0.0–0.2)
Basos: 2 %
EOS (ABSOLUTE): 0.1 10*3/uL (ref 0.0–0.4)
Eos: 2 %
Hematocrit: 48.3 % (ref 37.5–51.0)
Hemoglobin: 16.2 g/dL (ref 13.0–17.7)
Immature Grans (Abs): 0 10*3/uL (ref 0.0–0.1)
Immature Granulocytes: 0 %
Lymphocytes Absolute: 1.2 10*3/uL (ref 0.7–3.1)
Lymphs: 23 %
MCH: 31.5 pg (ref 26.6–33.0)
MCHC: 33.5 g/dL (ref 31.5–35.7)
MCV: 94 fL (ref 79–97)
Monocytes Absolute: 0.5 10*3/uL (ref 0.1–0.9)
Monocytes: 10 %
Neutrophils Absolute: 3.5 10*3/uL (ref 1.4–7.0)
Neutrophils: 63 %
Platelets: 230 10*3/uL (ref 150–450)
RBC: 5.14 x10E6/uL (ref 4.14–5.80)
RDW: 12.5 % (ref 11.6–15.4)
WBC: 5.4 10*3/uL (ref 3.4–10.8)

## 2022-12-09 LAB — THYROID PANEL WITH TSH
Free Thyroxine Index: 1.4 (ref 1.2–4.9)
T3 Uptake Ratio: 27 % (ref 24–39)
T4, Total: 5.3 ug/dL (ref 4.5–12.0)
TSH: 2.69 u[IU]/mL (ref 0.450–4.500)

## 2022-12-09 LAB — BMP8+EGFR
BUN/Creatinine Ratio: 14 (ref 9–20)
BUN: 14 mg/dL (ref 6–24)
CO2: 24 mmol/L (ref 20–29)
Calcium: 9.6 mg/dL (ref 8.7–10.2)
Chloride: 97 mmol/L (ref 96–106)
Creatinine, Ser: 0.98 mg/dL (ref 0.76–1.27)
Glucose: 106 mg/dL — ABNORMAL HIGH (ref 70–99)
Potassium: 4.3 mmol/L (ref 3.5–5.2)
Sodium: 138 mmol/L (ref 134–144)
eGFR: 93 mL/min/{1.73_m2} (ref >59)

## 2022-12-14 ENCOUNTER — Encounter: Payer: Self-pay | Admitting: Family Medicine

## 2022-12-14 MED ORDER — TADALAFIL 10 MG PO TABS: 10.0000 mg | ORAL_TABLET | Freq: Every day | ORAL | 2 refills | Status: DC | PRN

## 2022-12-14 NOTE — Addendum Note (Signed)
Addended by: Baruch Gouty on: 12/14/2022 12:54 PM   Modules accepted: Orders

## 2022-12-22 ENCOUNTER — Other Ambulatory Visit: Payer: Self-pay | Admitting: Family Medicine

## 2022-12-23 MED ORDER — HYDROCHLOROTHIAZIDE 12.5 MG PO TABS: 25.0000 mg | ORAL_TABLET | Freq: Every day | ORAL | 1 refills | Status: DC

## 2022-12-23 NOTE — Telephone Encounter (Signed)
TC from [facility] There was a note on yesterday's RF request, pt stated med was increased to 2 tabs QD Noted in 12/08/22 OV notes that HCTZ was increased at last visit to 25 mg has been tolerating well Script written on 11/16/22 was set to No Print, and Qty was at #90 Correcting Qty and sending to pharmacy

## 2022-12-23 NOTE — Addendum Note (Signed)
Addended by: Antonietta Barcelona D on: 12/23/2022 09:57 AM   Modules accepted: Orders

## 2023-01-20 ENCOUNTER — Encounter: Payer: Self-pay | Admitting: Radiology

## 2023-01-24 ENCOUNTER — Other Ambulatory Visit: Payer: Self-pay | Admitting: *Deleted

## 2023-01-24 DIAGNOSIS — F339 Major depressive disorder, recurrent, unspecified: Secondary | ICD-10-CM

## 2023-01-24 MED ORDER — BUPROPION HCL ER (XL) 300 MG PO TB24: 300.0000 mg | ORAL_TABLET | Freq: Every day | ORAL | 0 refills | Status: DC

## 2023-01-25 ENCOUNTER — Ambulatory Visit: Payer: 59 | Admitting: Family Medicine

## 2023-01-25 ENCOUNTER — Encounter: Payer: Self-pay | Admitting: Family Medicine

## 2023-01-25 VITALS — BP 160/97 | HR 102 | Temp 95.6°F | Ht 68.0 in | Wt 197.4 lb

## 2023-01-25 DIAGNOSIS — I10 Essential (primary) hypertension: Secondary | ICD-10-CM

## 2023-01-25 DIAGNOSIS — F339 Major depressive disorder, recurrent, unspecified: Secondary | ICD-10-CM | POA: Diagnosis not present

## 2023-01-25 DIAGNOSIS — F419 Anxiety disorder, unspecified: Secondary | ICD-10-CM | POA: Diagnosis not present

## 2023-01-25 MED ORDER — BUPROPION HCL ER (XL) 300 MG PO TB24: 300.0000 mg | ORAL_TABLET | Freq: Every day | ORAL | 1 refills | Status: DC

## 2023-01-25 NOTE — Progress Notes (Signed)
Subjective:  Patient ID: Walter Wilkinson, male    DOB: 1970-12-17, 52 y.o.   MRN: [id number]  Patient Care Team: Baruch Gouty, FNP as PCP - General (Family Medicine)   Chief Complaint:  Establish Care ([facility] patient /) and Medical Management of Chronic Issues (3 month follow up. Patient states he has been drinking more since his papa is dying )   HPI: Walter Wilkinson is a 52 y.o. male presenting on 01/25/2023 for Establish Care ([place] patient /) and Medical Management of Chronic Issues (3 month follow up. Patient states he has been drinking more since his papa is dying )   1. Depression, recurrent ([place]) 2. Anxiety Has been doing well on current regimen. Does report increased stress due to the illness of his grandfather. States he has noticed sweating over the last few weeks as well. No SI or HI.    01/25/2023    8:23 AM 12/08/2022    8:29 AM 10/25/2022    8:12 AM  GAD 7 : Generalized Anxiety Score  Nervous, Anxious, on Edge 0 0 3  Control/stop worrying 2 0 3  Worry too much - different things 2 0 3  Trouble relaxing '2 2 3  '$ Restless '2 3 3  '$ Easily annoyed or irritable '2 3 3  '$ Afraid - awful might happen 0 0 0  Total GAD 7 Score '10 8 18  '$ Anxiety Difficulty Somewhat difficult Somewhat difficult        01/25/2023    8:22 AM 12/08/2022    8:28 AM 10/25/2022    8:11 AM 01/25/2020    9:49 AM 09/24/2019   10:05 PM  Depression screen PHQ 2/9  Decreased Interest '1 2 3 '$ 0 0  Down, Depressed, Hopeless 1 0 3 0 0  PHQ - 2 Score '2 2 6 '$ 0 0  Altered sleeping 0 0 3    Tired, decreased energy 3 3 0    Change in appetite 2 1 0    Feeling bad or failure about yourself  0 0 3    Trouble concentrating '2 3 3    '$ Moving slowly or fidgety/restless 0 0 0    Suicidal thoughts 0 0 0    PHQ-9 Score '9 9 15    '$ Difficult doing work/chores Somewhat difficult Somewhat difficult Very difficult       3. Primary hypertension Complaint with meds - Yes Current Medications - Lisinopril and HCTZ Checking BP at home -  No Exercising Regularly - No Watching Salt intake - No Pertinent ROS:  Headache - No Fatigue - No Visual Disturbances - No Chest pain - No Dyspnea - No Palpitations - No LE edema - No They report good compliance with medications and can restate their regimen by memory. No medication side effects.  BP Readings from Last 3 Encounters:  01/25/23 (!) 160/97  12/08/22 124/79  10/25/22 (!) 158/98      Relevant past medical, surgical, family, and social history reviewed and updated as indicated.  Allergies and medications reviewed and updated. Data reviewed: Chart in Epic.   Past Medical History:  Diagnosis Date   Anxiety 07/15/2010   Qualifier: Diagnosis of  By: Percival Spanish, MD, Farrel Gordon     Dyslipidemia    HTN (hypertension)    Hypogonadism in male 08/12/2015    Past Surgical History:  Procedure Laterality Date   SHOULDER ARTHROSCOPY W/ ROTATOR CUFF REPAIR Right 04/2015   VASECTOMY  1992   Dr. Carin Primrose  Social History   Socioeconomic History   Marital status: Married    Spouse name: Not on file   Number of children: 1   Years of education: Not on file   Highest education level: Not on file  Occupational History   Occupation: self employed  Tobacco Use   Smoking status: Never   Smokeless tobacco: Current    Types: Chew   Tobacco comments:    no cigarettes; occasionally uses smokeless tobacco  Vaping Use   Vaping Use: Never used  Substance and Sexual Activity   Alcohol use: Not Currently    Comment: 4 beers daily   Drug use: No   Sexual activity: Yes    Partners: Female    Birth control/protection: Surgical  Other Topics Concern   Not on file  Social History Narrative   Not on file   Social Determinants of Health   Financial Resource Strain: Not on file  Food Insecurity: Not on file  Transportation Needs: Not on file  Physical Activity: Not on file  Stress: Not on file  Social Connections: Not on file  Intimate Partner Violence: Not on file     Outpatient Encounter Medications as of 01/25/2023  Medication Sig   ASPIRIN 81 PO Take by mouth.   Cholecalciferol (VITAMIN D-3) 5000 UNITS TABS Take by mouth 2 (two) times daily.   hydrochlorothiazide (HYDRODIURIL) 12.5 MG tablet Take 2 tablets (25 mg total) by mouth daily.   lisinopril (ZESTRIL) 40 MG tablet Take 1 tablet (40 mg total) by mouth daily.   OVER THE COUNTER MEDICATION Super beets   tadalafil (CIALIS) 10 MG tablet Take 1 tablet (10 mg total) by mouth daily as needed for erectile dysfunction.   [DISCONTINUED] buPROPion (WELLBUTRIN XL) 300 MG 24 hr tablet Take 1 tablet (300 mg total) by mouth daily.   buPROPion (WELLBUTRIN XL) 300 MG 24 hr tablet Take 1 tablet (300 mg total) by mouth daily.   No facility-administered encounter medications on file as of 01/25/2023.    No Known Allergies  Review of Systems  Constitutional:  Positive for diaphoresis. Negative for activity change, appetite change, chills, fatigue, fever and unexpected weight change.  HENT: Negative.    Eyes: Negative.  Negative for photophobia and visual disturbance.  Respiratory:  Negative for cough, chest tightness and shortness of breath.   Cardiovascular:  Negative for chest pain, palpitations and leg swelling.  Gastrointestinal:  Negative for abdominal pain, blood in stool, constipation, diarrhea, nausea and vomiting.  Endocrine: Negative.   Genitourinary:  Negative for decreased urine volume, difficulty urinating, dysuria, frequency and urgency.  Musculoskeletal:  Negative for arthralgias and myalgias.  Skin: Negative.   Allergic/Immunologic: Negative.   Neurological:  Negative for dizziness, tremors, seizures, syncope, facial asymmetry, speech difficulty, weakness, light-headedness, numbness and headaches.  Hematological: Negative.   Psychiatric/Behavioral:  Negative for confusion, hallucinations, sleep disturbance and suicidal ideas.   All other systems reviewed and are negative.       Objective:   BP (!) 160/97   Pulse (!) 102   Temp (!) 95.6 F (35.3 C) (Temporal)   Ht '5\' 8"'$  (1.727 m)   Wt 197 lb 6.4 oz (89.5 kg)   SpO2 94%   BMI 30.01 kg/m    Wt Readings from Last 3 Encounters:  01/25/23 197 lb 6.4 oz (89.5 kg)  12/08/22 194 lb 6.4 oz (88.2 kg)  10/25/22 188 lb (85.3 kg)    Physical Exam Vitals and nursing note reviewed.  Constitutional:  General: He is not in acute distress.    Appearance: Normal appearance. He is obese. He is not ill-appearing, toxic-appearing or diaphoretic.  HENT:     Head: Normocephalic and atraumatic.     Nose: Nose normal.     Mouth/Throat:     Mouth: Mucous membranes are moist.     Pharynx: No oropharyngeal exudate or posterior oropharyngeal erythema.  Eyes:     Conjunctiva/sclera: Conjunctivae normal.     Pupils: Pupils are equal, round, and reactive to light.  Cardiovascular:     Rate and Rhythm: Normal rate and regular rhythm.     Heart sounds: Normal heart sounds.  Pulmonary:     Effort: Pulmonary effort is normal.     Breath sounds: Normal breath sounds.  Abdominal:     General: Bowel sounds are normal.     Palpations: Abdomen is soft.  Musculoskeletal:     Right lower leg: No edema.     Left lower leg: No edema.  Skin:    General: Skin is warm and dry.     Capillary Refill: Capillary refill takes less than 2 seconds.  Neurological:     General: No focal deficit present.     Mental Status: He is alert and oriented to person, place, and time.  Psychiatric:        Mood and Affect: Mood normal.        Behavior: Behavior normal.        Thought Content: Thought content normal.        Judgment: Judgment normal.     Results for orders placed or performed in visit on 12/08/22  [facility]  Result Value Ref Range   Glucose 106 (H) 70 - 99 mg/dL   BUN 14 6 - 24 mg/dL   Creatinine, Ser 0.98 0.76 - 1.27 mg/dL   eGFR 93 >59 mL/min/1.73   BUN/Creatinine Ratio 14 9 - 20   Sodium 138 134 - 144 mmol/L   Potassium 4.3 3.5 - 5.2  mmol/L   Chloride 97 96 - 106 mmol/L   CO2 24 20 - 29 mmol/L   Calcium 9.6 8.7 - 10.2 mg/dL  CBC with Differential/Platelet  Result Value Ref Range   WBC 5.4 3.4 - 10.8 x10E3/uL   RBC 5.14 4.14 - 5.80 x10E6/uL   Hemoglobin 16.2 13.0 - 17.7 g/dL   Hematocrit 48.3 37.5 - 51.0 %   MCV 94 79 - 97 fL   MCH 31.5 26.6 - 33.0 pg   MCHC 33.5 31.5 - 35.7 g/dL   RDW 12.5 11.6 - 15.4 %   Platelets 230 150 - 450 x10E3/uL   Neutrophils 63 Not Estab. %   Lymphs 23 Not Estab. %   Monocytes 10 Not Estab. %   Eos 2 Not Estab. %   Basos 2 Not Estab. %   Neutrophils Absolute 3.5 1.4 - 7.0 x10E3/uL   Lymphocytes Absolute 1.2 0.7 - 3.1 x10E3/uL   Monocytes Absolute 0.5 0.1 - 0.9 x10E3/uL   EOS (ABSOLUTE) 0.1 0.0 - 0.4 x10E3/uL   Basophils Absolute 0.1 0.0 - 0.2 x10E3/uL   Immature Granulocytes 0 Not Estab. %   Immature Grans (Abs) 0.0 0.0 - 0.1 x10E3/uL  Thyroid Panel With TSH  Result Value Ref Range   TSH 2.690 0.450 - 4.500 uIU/mL   T4, Total 5.3 4.5 - 12.0 ug/dL   T3 Uptake Ratio 27 24 - 39 %   Free Thyroxine Index 1.4 1.2 - 4.9  Pertinent labs & imaging results that were available during my care of the patient were reviewed by me and considered in my medical decision making.  Assessment & Plan:  Abiodun was seen today for establish care and medical management of chronic issues.  Diagnoses and all orders for this visit:  Depression, recurrent ([place]) Anxiety Doing well with below, will continue. Report new, worsening, or persistent symptoms.  -     buPROPion (WELLBUTRIN XL) 300 MG 24 hr tablet; Take 1 tablet (300 mg total) by mouth daily.  Primary hypertension Elevated today but usually well controlled. Continue current regimen. DASH diet and exercise encouraged.     Continue all other maintenance medications.  Follow up plan: Return in about 6 months (around 07/28/2023) for chronic follow up.   Continue healthy lifestyle choices, including diet (rich in fruits, vegetables, and  lean proteins, and low in salt and simple carbohydrates) and exercise (at least 30 minutes of moderate physical activity daily).  Educational handout given for HTN  The above assessment and management plan was discussed with the patient. The patient verbalized understanding of and has agreed to the management plan. Patient is aware to call the clinic if they develop any new symptoms or if symptoms persist or worsen. Patient is aware when to return to the clinic for a follow-up visit. Patient educated on when it is appropriate to go to the emergency department.   Monia Pouch, FNP-C [facility] [phone]

## 2023-01-25 NOTE — Patient Instructions (Signed)

## 2023-01-28 ENCOUNTER — Ambulatory Visit: Payer: 59 | Admitting: Nurse Practitioner

## 2023-08-02 ENCOUNTER — Encounter: Payer: Self-pay | Admitting: Family Medicine

## 2023-08-02 ENCOUNTER — Ambulatory Visit (INDEPENDENT_AMBULATORY_CARE_PROVIDER_SITE_OTHER): Payer: Self-pay | Admitting: Family Medicine

## 2023-08-02 DIAGNOSIS — F339 Major depressive disorder, recurrent, unspecified: Secondary | ICD-10-CM

## 2023-08-02 DIAGNOSIS — I10 Essential (primary) hypertension: Secondary | ICD-10-CM

## 2023-08-02 DIAGNOSIS — F419 Anxiety disorder, unspecified: Secondary | ICD-10-CM

## 2023-08-02 MED ORDER — LISINOPRIL 40 MG PO TABS
40.0000 mg | ORAL_TABLET | Freq: Every day | ORAL | 2 refills | Status: DC
Start: 2023-08-02 — End: 2024-01-31

## 2023-08-02 NOTE — Progress Notes (Signed)
Subjective:  Patient ID: Walter Wilkinson, male    DOB: 1971/08/17, 52 y.o.   MRN: [id number]  Patient Care Team: Sonny Masters, FNP as PCP - General (Family Medicine)   Chief Complaint:  Medical Management of Chronic Issues (6 month follow up )   HPI: Walter Wilkinson is a 52 y.o. male presenting on 08/02/2023 for Medical Management of Chronic Issues (6 month follow up )   1. Depression, recurrent ([facility]) 2. Anxiety Pt declined to answer PHQ and GAD surveys, states he stopped taking his medications and feels fine without it. Does not wish to restart medications. Denies SI or HI.   3. Essential hypertension Stopped taking his hydrochlorothiazide, states this made him dizzy. He has been checking blood pressure at home and it is running 130/70-80 range. Denies chest pain, headaches, leg swelling, weakness or confusion. No visual changes.      Relevant past medical, surgical, family, and social history reviewed and updated as indicated.  Allergies and medications reviewed and updated. Data reviewed: Chart in Epic.   Past Medical History:  Diagnosis Date   Anxiety 07/15/2010   Qualifier: Diagnosis of  By: Antoine Poche, MD, Gerrit Heck     Dyslipidemia    HTN (hypertension)    Hypogonadism in male 08/12/2015    Past Surgical History:  Procedure Laterality Date   SHOULDER ARTHROSCOPY W/ ROTATOR CUFF REPAIR Right 04/2015   VASECTOMY  1992   Dr. Retia Passe    Social History   Socioeconomic History   Marital status: Married    Spouse name: Not on file   Number of children: 1   Years of education: Not on file   Highest education level: Not on file  Occupational History   Occupation: self employed  Tobacco Use   Smoking status: Never   Smokeless tobacco: Current    Types: Chew   Tobacco comments:    no cigarettes; occasionally uses smokeless tobacco  Vaping Use   Vaping status: Never Used  Substance and Sexual Activity   Alcohol use: Not Currently    Comment: 4 beers  daily   Drug use: No   Sexual activity: Yes    Partners: Female    Birth control/protection: Surgical  Other Topics Concern   Not on file  Social History Narrative   Not on file   Social Determinants of Health   Financial Resource Strain: Not on file  Food Insecurity: Not on file  Transportation Needs: Not on file  Physical Activity: Not on file  Stress: Not on file  Social Connections: Not on file  Intimate Partner Violence: Not on file    Outpatient Encounter Medications as of 08/02/2023  Medication Sig   ASPIRIN 81 PO Take by mouth.   Cholecalciferol (VITAMIN D-3) 5000 UNITS TABS Take by mouth 2 (two) times daily.   OVER THE COUNTER MEDICATION Super beets   tadalafil (CIALIS) 10 MG tablet Take 1 tablet (10 mg total) by mouth daily as needed for erectile dysfunction.   [DISCONTINUED] buPROPion (WELLBUTRIN XL) 300 MG 24 hr tablet Take 1 tablet (300 mg total) by mouth daily.   [DISCONTINUED] lisinopril (ZESTRIL) 40 MG tablet Take 1 tablet (40 mg total) by mouth daily.   lisinopril (ZESTRIL) 40 MG tablet Take 1 tablet (40 mg total) by mouth daily.   [DISCONTINUED] hydrochlorothiazide (HYDRODIURIL) 12.5 MG tablet Take 2 tablets (25 mg total) by mouth daily. (Patient not taking: Reported on 08/02/2023)   No facility-administered encounter medications on  file as of 08/02/2023.    No Known Allergies  Review of Systems  Constitutional:  Negative for activity change, appetite change, chills, diaphoresis, fatigue, fever and unexpected weight change.  HENT: Negative.    Eyes: Negative.  Negative for photophobia and visual disturbance.  Respiratory:  Negative for cough, chest tightness and shortness of breath.   Cardiovascular:  Negative for chest pain, palpitations and leg swelling.  Gastrointestinal:  Negative for abdominal distention, abdominal pain, blood in stool, constipation, diarrhea, nausea and vomiting.  Endocrine: Negative.   Genitourinary:  Negative for decreased urine  volume, difficulty urinating, dysuria, frequency and urgency.  Musculoskeletal:  Negative for arthralgias and myalgias.  Skin: Negative.   Allergic/Immunologic: Negative.   Neurological:  Negative for dizziness, tremors, seizures, syncope, facial asymmetry, speech difficulty, weakness, light-headedness, numbness and headaches.  Hematological: Negative.   Psychiatric/Behavioral:  Negative for agitation, behavioral problems, confusion, decreased concentration, dysphoric mood, hallucinations, self-injury, sleep disturbance and suicidal ideas. The patient is not nervous/anxious and is not hyperactive.   All other systems reviewed and are negative.       Objective:  BP (!) 144/82   Pulse 79   Temp (!) 97.1 F (36.2 C) (Temporal)   Ht 5\' 8"  (1.727 m)   Wt 216 lb 9.6 oz (98.2 kg)   SpO2 97%   BMI 32.93 kg/m    Wt Readings from Last 3 Encounters:  08/02/23 216 lb 9.6 oz (98.2 kg)  01/25/23 197 lb 6.4 oz (89.5 kg)  12/08/22 194 lb 6.4 oz (88.2 kg)    Physical Exam Vitals and nursing note reviewed.  Constitutional:      General: He is not in acute distress.    Appearance: Normal appearance. He is well-developed and well-groomed. He is obese. He is not ill-appearing, toxic-appearing or diaphoretic.  HENT:     Head: Normocephalic and atraumatic.     Jaw: There is normal jaw occlusion.     Right Ear: Hearing normal.     Left Ear: Hearing normal.     Nose: Nose normal.     Mouth/Throat:     Lips: Pink.     Mouth: Mucous membranes are moist.     Pharynx: Oropharynx is clear. Uvula midline.  Eyes:     General: Lids are normal.     Extraocular Movements: Extraocular movements intact.     Conjunctiva/sclera: Conjunctivae normal.     Pupils: Pupils are equal, round, and reactive to light.  Neck:     Thyroid: No thyroid mass, thyromegaly or thyroid tenderness.     Vascular: No carotid bruit or JVD.     Trachea: Trachea and phonation normal.  Cardiovascular:     Rate and Rhythm:  Normal rate and regular rhythm.     Chest Wall: PMI is not displaced.     Pulses: Normal pulses.     Heart sounds: Normal heart sounds. No murmur heard.    No friction rub. No gallop.  Pulmonary:     Effort: Pulmonary effort is normal. No respiratory distress.     Breath sounds: Normal breath sounds. No wheezing.  Abdominal:     General: Bowel sounds are normal. There is no distension or abdominal bruit.     Palpations: Abdomen is soft. There is no hepatomegaly or splenomegaly.     Tenderness: There is no abdominal tenderness. There is no right CVA tenderness or left CVA tenderness.     Hernia: No hernia is present.  Musculoskeletal:  General: Normal range of motion.     Cervical back: Normal range of motion and neck supple.     Right lower leg: No edema.     Left lower leg: No edema.  Lymphadenopathy:     Cervical: No cervical adenopathy.  Skin:    General: Skin is warm and dry.     Capillary Refill: Capillary refill takes less than 2 seconds.     Coloration: Skin is not cyanotic, jaundiced or pale.     Findings: No rash.  Neurological:     General: No focal deficit present.     Mental Status: He is alert and oriented to person, place, and time.     Sensory: Sensation is intact.     Motor: Motor function is intact.     Coordination: Coordination is intact.     Gait: Gait is intact.     Deep Tendon Reflexes: Reflexes are normal and symmetric.  Psychiatric:        Attention and Perception: Attention and perception normal.        Mood and Affect: Mood and affect normal.        Speech: Speech normal.        Behavior: Behavior normal. Behavior is cooperative.        Thought Content: Thought content normal.        Cognition and Memory: Cognition and memory normal.        Judgment: Judgment normal.     Results for orders placed or performed in visit on 12/08/22  [facility]  Result Value Ref Range   Glucose 106 (H) 70 - 99 mg/dL   BUN 14 6 - 24 mg/dL   Creatinine, Ser  1.60 0.76 - 1.27 mg/dL   eGFR 93 >10 XN/ATF/5.73   BUN/Creatinine Ratio 14 9 - 20   Sodium 138 134 - 144 mmol/L   Potassium 4.3 3.5 - 5.2 mmol/L   Chloride 97 96 - 106 mmol/L   CO2 24 20 - 29 mmol/L   Calcium 9.6 8.7 - 10.2 mg/dL  CBC with Differential/Platelet  Result Value Ref Range   WBC 5.4 3.4 - 10.8 x10E3/uL   RBC 5.14 4.14 - 5.80 x10E6/uL   Hemoglobin 16.2 13.0 - 17.7 g/dL   Hematocrit 22.0 25.4 - 51.0 %   MCV 94 79 - 97 fL   MCH 31.5 26.6 - 33.0 pg   MCHC 33.5 31.5 - 35.7 g/dL   RDW 27.0 62.3 - 76.2 %   Platelets 230 150 - 450 x10E3/uL   Neutrophils 63 Not Estab. %   Lymphs 23 Not Estab. %   Monocytes 10 Not Estab. %   Eos 2 Not Estab. %   Basos 2 Not Estab. %   Neutrophils Absolute 3.5 1.4 - 7.0 x10E3/uL   Lymphocytes Absolute 1.2 0.7 - 3.1 x10E3/uL   Monocytes Absolute 0.5 0.1 - 0.9 x10E3/uL   EOS (ABSOLUTE) 0.1 0.0 - 0.4 x10E3/uL   Basophils Absolute 0.1 0.0 - 0.2 x10E3/uL   Immature Granulocytes 0 Not Estab. %   Immature Grans (Abs) 0.0 0.0 - 0.1 x10E3/uL  Thyroid Panel With TSH  Result Value Ref Range   TSH 2.690 0.450 - 4.500 uIU/mL   T4, Total 5.3 4.5 - 12.0 ug/dL   T3 Uptake Ratio 27 24 - 39 %   Free Thyroxine Index 1.4 1.2 - 4.9       Pertinent labs & imaging results that were available during my care of the patient were reviewed by  me and considered in my medical decision making.  Assessment & Plan:  Alvoid "LEE" was seen today for medical management of chronic issues.  Diagnoses and all orders for this visit:  Depression, recurrent ([facility]) Anxiety Stopped medications and does not wish to restart. States he is managing well without.   Essential hypertension BP fairly controlled. Changes were not made in regimen today as pt does not wish to add any medications. Goal BP is 130/80. Pt aware to report any persistent high or low readings. DASH diet and exercise encouraged. Exercise at least 150 minutes per week and increase as tolerated. Goal BMI > 25.  Stress management encouraged. Avoid nicotine and tobacco product use. Avoid excessive alcohol and NSAID's. Avoid more than 2000 mg of sodium daily. Medications as prescribed. Follow up as scheduled.  -     lisinopril (ZESTRIL) 40 MG tablet; Take 1 tablet (40 mg total) by mouth daily.     Continue all other maintenance medications.  Follow up plan: Return in about 6 months (around 01/30/2024) for CPE.   Continue healthy lifestyle choices, including diet (rich in fruits, vegetables, and lean proteins, and low in salt and simple carbohydrates) and exercise (at least 30 minutes of moderate physical activity daily).  Educational handout given for DASH diet, HTN.  The above assessment and management plan was discussed with the patient. The patient verbalized understanding of and has agreed to the management plan. Patient is aware to call the clinic if they develop any new symptoms or if symptoms persist or worsen. Patient is aware when to return to the clinic for a follow-up visit. Patient educated on when it is appropriate to go to the emergency department.   Kari Baars, FNP-C [facility] [phone]

## 2023-08-02 NOTE — Patient Instructions (Signed)

## 2023-08-24 ENCOUNTER — Ambulatory Visit: Payer: Self-pay

## 2023-08-24 ENCOUNTER — Encounter: Payer: Self-pay | Admitting: Family Medicine

## 2023-08-24 ENCOUNTER — Ambulatory Visit (INDEPENDENT_AMBULATORY_CARE_PROVIDER_SITE_OTHER): Payer: Self-pay | Admitting: Family Medicine

## 2023-08-24 ENCOUNTER — Ambulatory Visit: Payer: Self-pay | Admitting: Family Medicine

## 2023-08-24 VITALS — BP 139/88 | HR 90 | Temp 97.6°F | Ht 68.0 in | Wt 216.6 lb

## 2023-08-24 DIAGNOSIS — L249 Irritant contact dermatitis, unspecified cause: Secondary | ICD-10-CM

## 2023-08-24 MED ORDER — FLUOCINONIDE 0.05 % EX CREA: 1.0000 | TOPICAL_CREAM | Freq: Two times a day (BID) | CUTANEOUS | 5 refills | Status: DC

## 2023-08-24 MED ORDER — BETAMETHASONE SOD PHOS & ACET 6 (3-3) MG/ML IJ SUSP
6.0000 mg | Freq: Once | INTRAMUSCULAR | Status: AC
Start: 2023-08-24 — End: 2023-08-24
  Administered 2023-08-24: 6 mg via INTRAMUSCULAR

## 2023-08-24 NOTE — Progress Notes (Signed)
Chief Complaint  Patient presents with   Rash    Arms and around neck     HPI  Patient presents today for rash for 1 week. No itch or burning. On arms and neck. No known exposure.Some itching. Not on face, trunk. No fever, chills.   PMH: Smoking status noted ROS: Per HPI  Objective: BP 139/88   Pulse 90   Temp 97.6 F (36.4 C)   Ht 5\' 8"  (1.727 m)   Wt 216 lb 9.6 oz (98.2 kg)   SpO2 96%   BMI 32.93 kg/m  Gen: NAD, alert, cooperative with exam HEENT: NCAT, EOMI, PERRL CV: RRR, good S1/S2, no murmur Skin: there is fine maculopapular eruption on forearms and neck Ext: No edema, warm Neuro: Alert and oriented, No gross deficits  Assessment and plan:  1. Irritant contact dermatitis, unspecified trigger     Meds ordered this encounter  Medications   betamethasone acetate-betamethasone sodium phosphate (CELESTONE) injection 6 mg   fluocinonide cream (LIDEX) 0.05 %    Sig: Apply 1 Application topically 2 (two) times daily.    Dispense:  30 g    Refill:  5    No orders of the defined types were placed in this encounter.   Follow up as needed.  Mechele Claude, MD

## 2023-09-09 ENCOUNTER — Encounter: Payer: Self-pay | Admitting: Family Medicine

## 2023-09-13 ENCOUNTER — Other Ambulatory Visit: Payer: Self-pay | Admitting: Family Medicine

## 2023-09-13 DIAGNOSIS — I10 Essential (primary) hypertension: Secondary | ICD-10-CM

## 2023-09-13 DIAGNOSIS — N521 Erectile dysfunction due to diseases classified elsewhere: Secondary | ICD-10-CM

## 2023-09-13 MED ORDER — TADALAFIL 10 MG PO TABS: 10.0000 mg | ORAL_TABLET | Freq: Every day | ORAL | 2 refills | Status: DC | PRN

## 2023-09-13 MED ORDER — HYDROCHLOROTHIAZIDE 25 MG PO TABS
25.0000 mg | ORAL_TABLET | Freq: Every day | ORAL | 3 refills | Status: DC
Start: 2023-09-13 — End: 2024-01-31

## 2023-11-01 IMAGING — MR MR LUMBAR SPINE W/O CM
5 series · 32 of 48 positions shown · non-contrast
Comparison: None Available.

CLINICAL DATA: Lumbar radiculopathy with symptoms persisting over 6
weeks. Chronic lower back pain

EXAM:
MRI LUMBAR SPINE WITHOUT CONTRAST
TECHNIQUE: Multiplanar, multisequence MR imaging of the lumbar spine was
performed. No intravenous contrast was administered.

[Series 5: T2 · sagittal · 4.0mm · 0.68mm/px · 7 of 15 slices shown (1 of 2)]
[im 1/15]
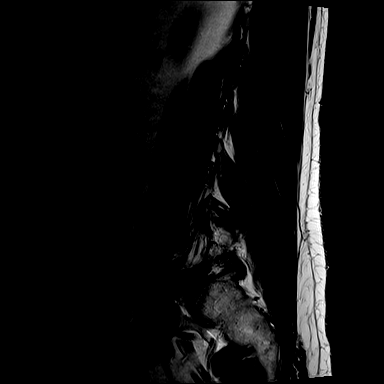
[im 3/15]
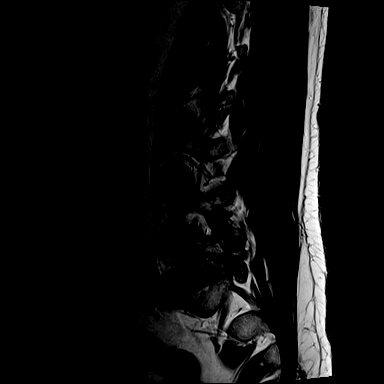
[im 5/15]
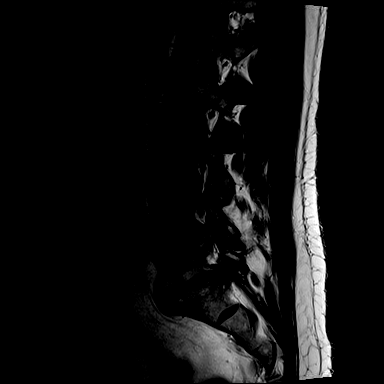
[im 8/15]
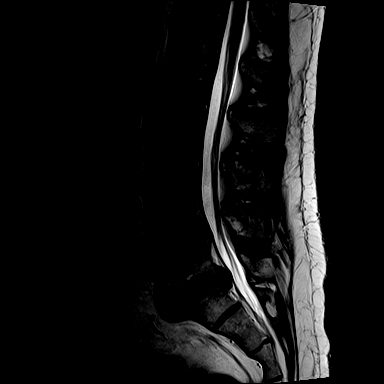
[im 10/15]
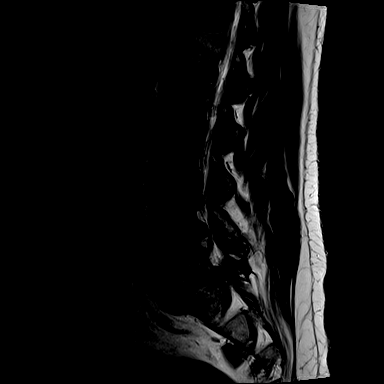
[im 12/15]
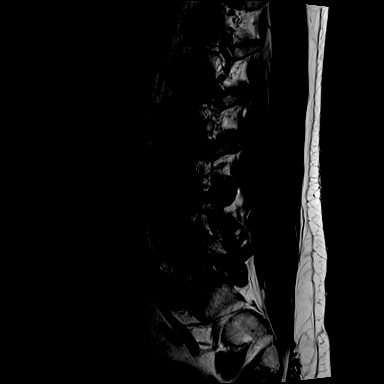
[im 15/15]
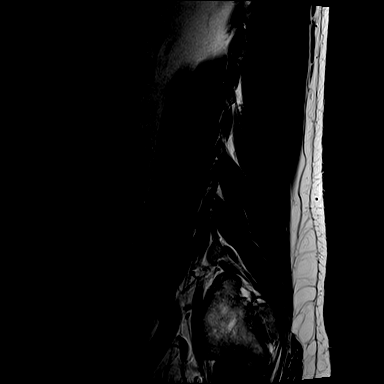

[Series 6: T1 · sagittal · 4.0mm · 0.81mm/px · 7 of 15 slices shown (1 of 2)]
[im 1/15]
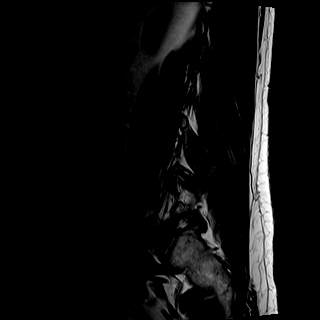
[im 3/15]
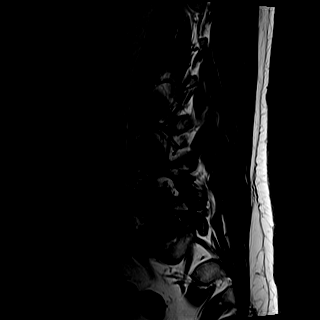
[im 5/15]
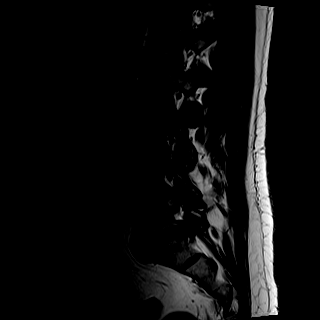
[im 8/15]
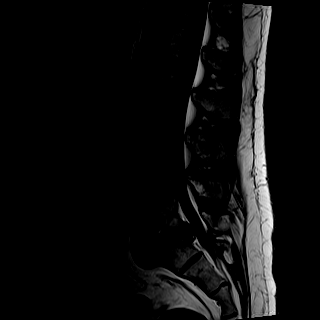
[im 10/15]
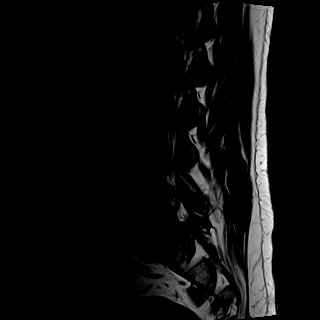
[im 12/15]
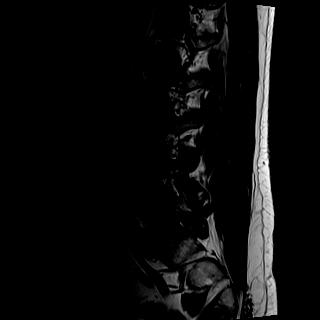
[im 15/15]
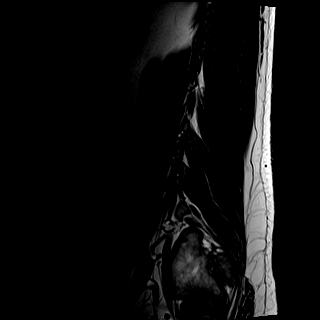

[Series 7: STIR · sagittal · 4.0mm · 0.51mm/px · 2 of 15 slices shown]
[im 1/15]
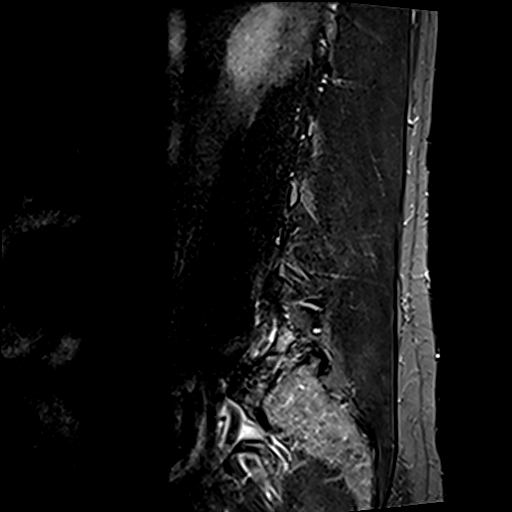
[im 3/15]
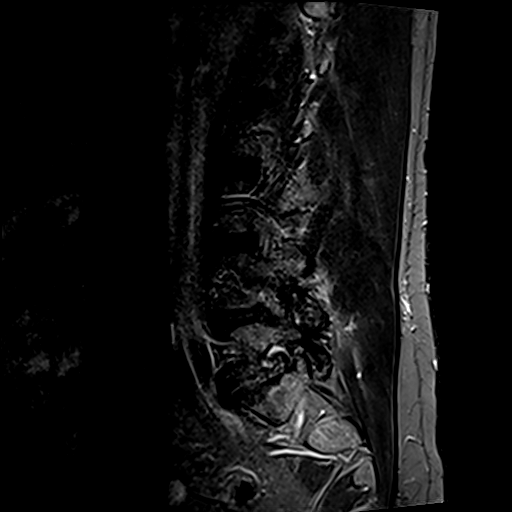

[Series 8: T2 · axial · 4.0mm · 0.70mm/px · z∈[-110,+83]mm · 8 of 34 slices shown (2 of 2)]
[im 1/34]
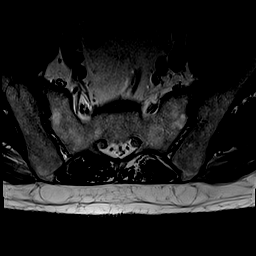
[im 6/34]
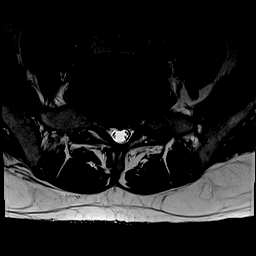
[im 11/34]
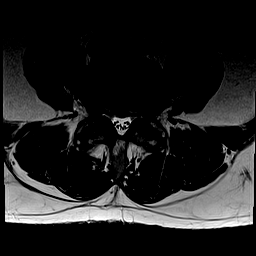
[im 16/34]
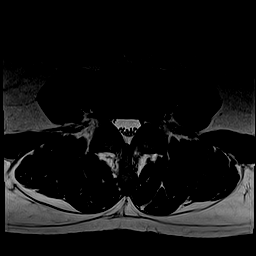
[im 18/34]
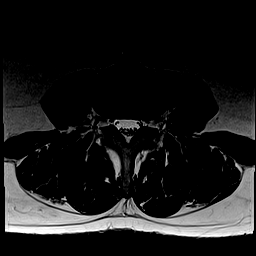
[im 23/34]
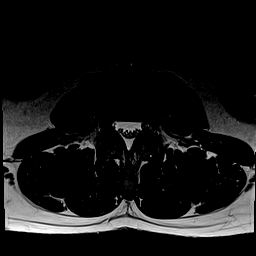
[im 28/34]
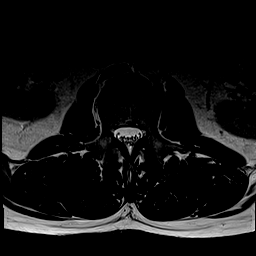
[im 34/34]
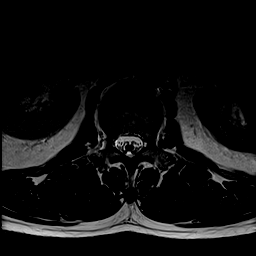

[Series 9: T1 · axial · 4.0mm · 0.35mm/px · z∈[-110,+83]mm · 8 of 34 slices shown (2 of 2)]
[im 1/34]
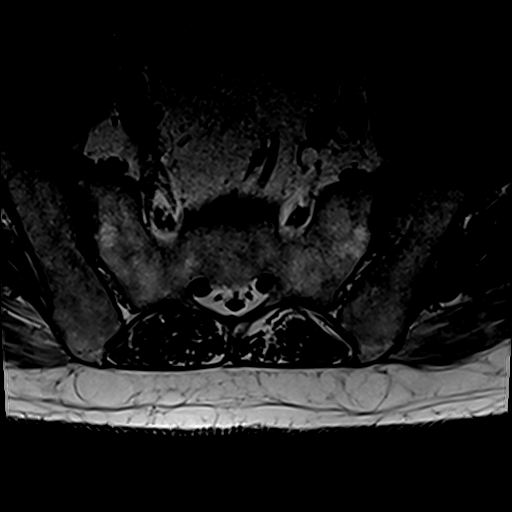
[im 6/34]
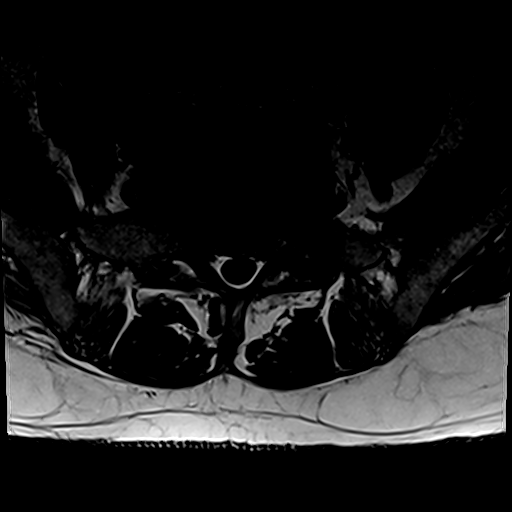
[im 11/34]
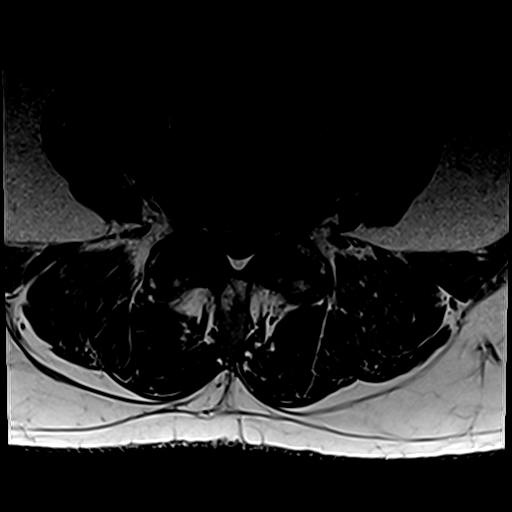
[im 16/34]
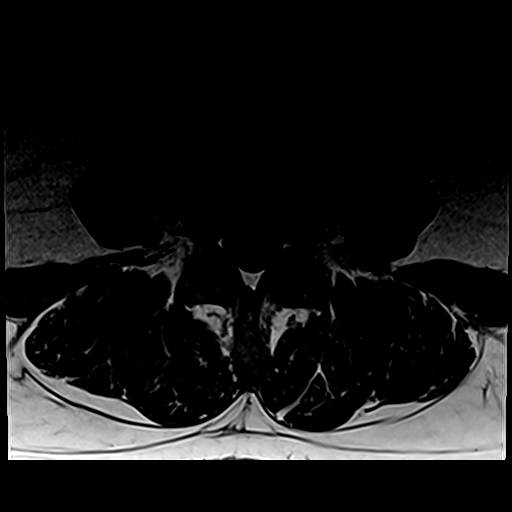
[im 18/34]
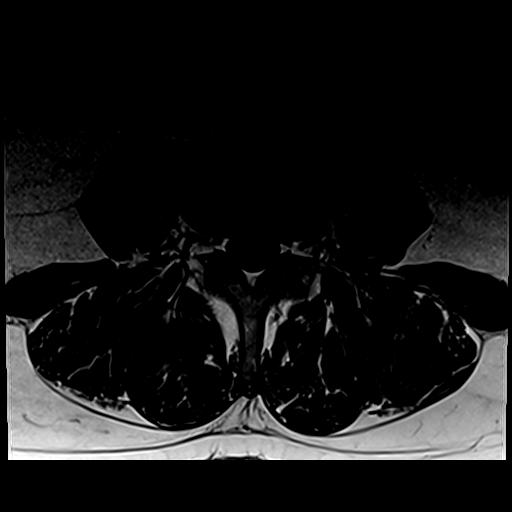
[im 23/34]
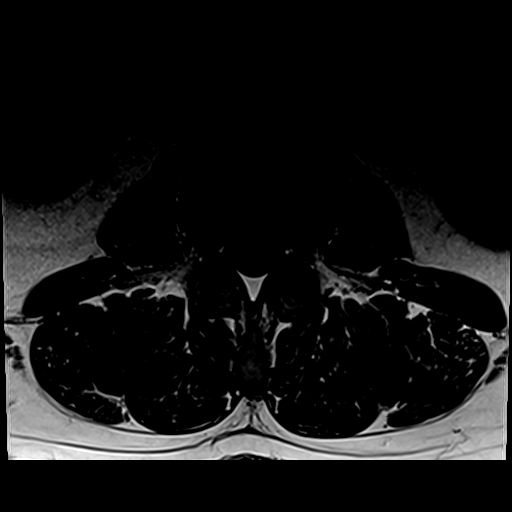
[im 28/34]
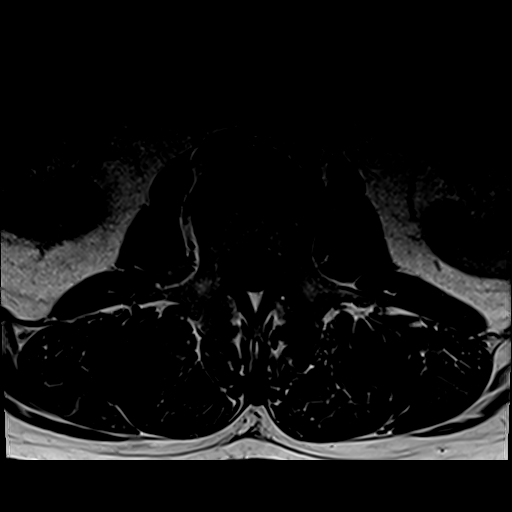
[im 34/34]
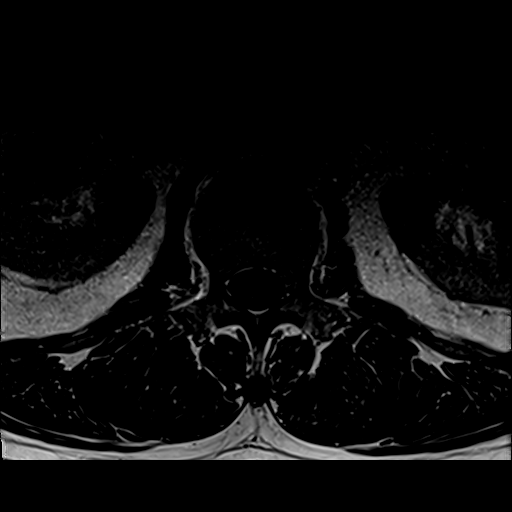

[32 of 48 positions shown; findings below may reference images not displayed]

FINDINGS: Segmentation:  5 lumbar type vertebrae

Alignment:  Slight L4-5 anterolisthesis

Vertebrae:  No fracture, evidence of discitis, or bone lesion.

Conus medullaris and cauda equina: Conus extends to the L1-2 level.
Conus and cauda equina appear normal.

Paraspinal and other soft tissues: Negative for perispinal mass or
inflammation

Disc levels:

L4-L5: Degenerative facet spurring which is bulky with mild
anterolisthesis. Disc bulging with bilateral subarticular recess
stenosis that could affect either L5 nerve root. Mild bilateral
foraminal narrowing

L5-S1:Disc bulging with broad downward pointing protrusion. Facet
spurring which is mild or moderate. Bilateral subarticular recess
stenosis impinging on both S1 nerve root. Bilateral foraminal
narrowing, advanced on the right with L5 root flattening on sagittal
images.
IMPRESSION: 1. L4-5 and L5-S1 degeneration with L4-5 anterolisthesis.
2. Subarticular recess stenosis with impingement bilaterally at L4-5
and L5-S1.
3. Advanced right foraminal impingement at L5-S1.

## 2024-01-31 ENCOUNTER — Ambulatory Visit: Payer: Self-pay | Admitting: Family Medicine

## 2024-01-31 ENCOUNTER — Encounter: Payer: Self-pay | Admitting: Family Medicine

## 2024-01-31 VITALS — BP 134/86 | HR 93 | Temp 97.5°F | Ht 68.0 in | Wt 220.0 lb

## 2024-01-31 DIAGNOSIS — I10 Essential (primary) hypertension: Secondary | ICD-10-CM

## 2024-01-31 DIAGNOSIS — Z0000 Encounter for general adult medical examination without abnormal findings: Secondary | ICD-10-CM

## 2024-01-31 DIAGNOSIS — E782 Mixed hyperlipidemia: Secondary | ICD-10-CM

## 2024-01-31 DIAGNOSIS — Z0001 Encounter for general adult medical examination with abnormal findings: Secondary | ICD-10-CM

## 2024-01-31 DIAGNOSIS — E559 Vitamin D deficiency, unspecified: Secondary | ICD-10-CM

## 2024-01-31 LAB — LIPID PANEL

## 2024-01-31 MED ORDER — HYDROCHLOROTHIAZIDE 25 MG PO TABS: 25.0000 mg | ORAL_TABLET | Freq: Every day | ORAL | 3 refills | Status: AC

## 2024-01-31 MED ORDER — LISINOPRIL 40 MG PO TABS: 40.0000 mg | ORAL_TABLET | Freq: Every day | ORAL | 3 refills | Status: DC

## 2024-01-31 NOTE — Progress Notes (Signed)
 Complete physical exam  Patient: Walter Wilkinson   DOB: 04-18-1971   53 y.o. Male  MRN: [id number]  Subjective:    Chief Complaint  Patient presents with   Annual Exam    Walter Wilkinson is a 53 y.o. male who presents today for a complete physical exam. He reports consuming a general diet. The patient does not participate in regular exercise at present. He generally feels well. He reports sleeping well. He does not have additional problems to discuss today.  Discussed the use of AI scribe software for clinical note transcription with the patient, who gave verbal consent to proceed.  History of Present Illness   He presents for an annual physical exam.  He experienced a recent episode of a cold that lasted for three days, during which he stayed in bed. He now has a cough that occurs at night before bed and in the morning, lasting about ten minutes each time. He believes it might be due to allergies. No night sweats, weight loss, or loss of appetite.  He is currently taking lisinopril and hydrochlorothiazide for blood pressure management, and Cialis. He obtains his Cialis through a referral and his blood pressure medications from [facility]. No recent surgeries or significant illnesses aside from the cold.  He notes a weight increase from 178 pounds last summer to 219-220 pounds, attributing it to increased cooking during the winter. He has not undergone colorectal cancer screening.  No family history of prostate cancer or colorectal cancer.       Most recent fall risk assessment:    01/31/2024    7:55 AM  Fall Risk   Falls in the past year? 0  Risk for fall due to : No Fall Risks  Follow up Falls prevention discussed     Most recent depression screenings:    01/31/2024    7:55 AM 08/24/2023   12:14 PM  PHQ 2/9 Scores  PHQ - 2 Score 1 0  PHQ- 9 Score 2     Vision:Not within last year  and Dental: No current dental problems  Patient Active Problem List   Diagnosis Date Noted    Anxiety 10/25/2022   Depression, recurrent (HCC) 10/25/2022   Obesity (BMI 30.0-34.9) 09/19/2019   Insulin resistance 08/12/2015   Vitamin D deficiency 08/19/2014   Tobacco chew use 08/19/2014   Hyperlipidemia 01/21/2009   Erectile dysfunction 01/21/2009   Primary hypertension 01/21/2009   Past Medical History:  Diagnosis Date   Anxiety 07/15/2010   Qualifier: Diagnosis of  By: Antoine Poche, MD, Gerrit Heck     Dyslipidemia    HTN (hypertension)    Hypogonadism in male 08/12/2015   Past Surgical History:  Procedure Laterality Date   SHOULDER ARTHROSCOPY W/ ROTATOR CUFF REPAIR Right 04/2015   VASECTOMY  1992   Dr. Retia Passe   Social History   Tobacco Use   Smoking status: Never   Smokeless tobacco: Current    Types: Chew   Tobacco comments:    no cigarettes; occasionally uses smokeless tobacco  Vaping Use   Vaping status: Never Used  Substance Use Topics   Alcohol use: Not Currently    Comment: 4 beers daily   Drug use: No   Social History   Socioeconomic History   Marital status: Married    Spouse name: Not on file   Number of children: 1   Years of education: Not on file   Highest education level: Not on file  Occupational History  Occupation: self employed  Tobacco Use   Smoking status: Never   Smokeless tobacco: Current    Types: Chew   Tobacco comments:    no cigarettes; occasionally uses smokeless tobacco  Vaping Use   Vaping status: Never Used  Substance and Sexual Activity   Alcohol use: Not Currently    Comment: 4 beers daily   Drug use: No   Sexual activity: Yes    Partners: Female    Birth control/protection: Surgical  Other Topics Concern   Not on file  Social History Narrative   Not on file   Social Drivers of Health   Financial Resource Strain: Not on file  Food Insecurity: Not on file  Transportation Needs: Not on file  Physical Activity: Not on file  Stress: Not on file  Social Connections: Not on file  Intimate Partner  Violence: Not on file   Family Status  Relation Name Status   Father  Deceased   Mother  Deceased   Sister  Alive   Brother  Alive   Brother  Alive   MGM  Deceased   MGF  Deceased   PGM  Deceased   PGF  Deceased   Son  Alive   Paternal GGF  Deceased  No partnership data on file   Family History  Problem Relation Age of Onset   Heart attack Father        in his 68s   Heart disease Father    Prostate cancer Paternal Great-grandfather    No Known Allergies    Patient Care Team: Iisha Soyars, Doralee Albino, FNP as PCP - General (Family Medicine)   Outpatient Medications Prior to Visit  Medication Sig   ASPIRIN 81 PO Take by mouth.   OVER THE COUNTER MEDICATION Super beets   tadalafil (CIALIS) 10 MG tablet Take 1 tablet (10 mg total) by mouth daily as needed for erectile dysfunction.   [DISCONTINUED] hydrochlorothiazide (HYDRODIURIL) 25 MG tablet Take 1 tablet (25 mg total) by mouth daily.   [DISCONTINUED] lisinopril (ZESTRIL) 40 MG tablet Take 1 tablet (40 mg total) by mouth daily.   [DISCONTINUED] Cholecalciferol (VITAMIN D-3) 5000 UNITS TABS Take by mouth 2 (two) times daily.   [DISCONTINUED] fluocinonide cream (LIDEX) 0.05 % Apply 1 Application topically 2 (two) times daily.   No facility-administered medications prior to visit.    ROS per HPI      Objective:     BP 134/86   Pulse 93   Temp (!) 97.5 F (36.4 C)   Ht 5\' 8"  (1.727 m)   Wt 220 lb (99.8 kg)   SpO2 96%   BMI 33.45 kg/m  BP Readings from Last 3 Encounters:  01/31/24 134/86  08/24/23 139/88  08/02/23 (!) 144/82   Wt Readings from Last 3 Encounters:  01/31/24 220 lb (99.8 kg)  08/24/23 216 lb 9.6 oz (98.2 kg)  08/02/23 216 lb 9.6 oz (98.2 kg)   SpO2 Readings from Last 3 Encounters:  01/31/24 96%  08/24/23 96%  08/02/23 97%      Physical Exam Vitals and nursing note reviewed.  Constitutional:      General: He is not in acute distress.    Appearance: Normal appearance. He is well-developed and  well-groomed. He is obese. He is not ill-appearing, toxic-appearing or diaphoretic.  HENT:     Head: Normocephalic and atraumatic.     Jaw: There is normal jaw occlusion.     Right Ear: Hearing, tympanic membrane, ear canal and external ear  normal.     Left Ear: Hearing, tympanic membrane, ear canal and external ear normal.     Nose: Nose normal.     Mouth/Throat:     Lips: Pink.     Mouth: Mucous membranes are moist.     Pharynx: Oropharynx is clear. Uvula midline.  Eyes:     General: Lids are normal.     Extraocular Movements: Extraocular movements intact.     Conjunctiva/sclera: Conjunctivae normal.     Pupils: Pupils are equal, round, and reactive to light.  Neck:     Thyroid: No thyroid mass, thyromegaly or thyroid tenderness.     Vascular: No carotid bruit or JVD.     Trachea: Trachea and phonation normal.  Cardiovascular:     Rate and Rhythm: Normal rate and regular rhythm.     Chest Wall: PMI is not displaced.     Pulses:          Dorsalis pedis pulses are 2+ on the right side and 2+ on the left side.       Posterior tibial pulses are 2+ on the right side and 2+ on the left side.     Heart sounds: Normal heart sounds. No murmur heard.    No friction rub. No gallop.  Pulmonary:     Effort: Pulmonary effort is normal. No respiratory distress.     Breath sounds: Normal breath sounds. No wheezing.  Chest:     Chest wall: No mass.  Breasts:    Breasts are symmetrical.  Abdominal:     General: Abdomen is flat. Bowel sounds are normal. There is no distension or abdominal bruit.     Palpations: Abdomen is soft. There is no hepatomegaly or splenomegaly.     Tenderness: There is no abdominal tenderness. There is no right CVA tenderness or left CVA tenderness.     Hernia: No hernia is present.  Musculoskeletal:        General: Normal range of motion.     Cervical back: Full passive range of motion without pain, normal range of motion and neck supple.     Right lower leg: No  edema.     Left lower leg: No edema.  Feet:     Right foot:     Skin integrity: Skin integrity normal.     Toenail Condition: Right toenails are normal.     Left foot:     Skin integrity: Skin integrity normal.     Toenail Condition: Left toenails are normal.  Lymphadenopathy:     Cervical: No cervical adenopathy.     Upper Body:     Right upper body: No supraclavicular, axillary or pectoral adenopathy.     Left upper body: No supraclavicular, axillary or pectoral adenopathy.  Skin:    General: Skin is warm and dry.     Capillary Refill: Capillary refill takes less than 2 seconds.     Coloration: Skin is not cyanotic, jaundiced or pale.     Findings: No rash.  Neurological:     General: No focal deficit present.     Mental Status: He is alert and oriented to person, place, and time.     Sensory: Sensation is intact.     Motor: Motor function is intact.     Coordination: Coordination is intact.     Gait: Gait is intact.     Deep Tendon Reflexes: Reflexes are normal and symmetric.  Psychiatric:        Attention and Perception:  Attention and perception normal.        Mood and Affect: Mood and affect normal.        Speech: Speech normal.        Behavior: Behavior normal. Behavior is cooperative.        Thought Content: Thought content normal.        Cognition and Memory: Cognition and memory normal.        Judgment: Judgment normal.       Last CBC Lab Results  Component Value Date   WBC 5.4 12/08/2022   HGB 16.2 12/08/2022   HCT 48.3 12/08/2022   MCV 94 12/08/2022   MCH 31.5 12/08/2022   RDW 12.5 12/08/2022   PLT 230 12/08/2022   Last metabolic panel Lab Results  Component Value Date   GLUCOSE 106 (H) 12/08/2022   NA 138 12/08/2022   K 4.3 12/08/2022   CL 97 12/08/2022   CO2 24 12/08/2022   BUN 14 12/08/2022   CREATININE 0.98 12/08/2022   EGFR 93 12/08/2022   CALCIUM 9.6 12/08/2022   PROT 6.7 10/25/2022   ALBUMIN 4.5 10/25/2022   LABGLOB 2.2 10/25/2022    AGRATIO 2.0 10/25/2022   BILITOT 1.0 10/25/2022   ALKPHOS 70 10/25/2022   AST 19 10/25/2022   ALT 16 10/25/2022   Last lipids Lab Results  Component Value Date   CHOL 141 10/25/2022   HDL 37 (L) 10/25/2022   LDLCALC 84 10/25/2022   TRIG 109 10/25/2022   CHOLHDL 3.8 10/25/2022   Last hemoglobin A1c Lab Results  Component Value Date   HGBA1C 5.5 10/19/2021   Last thyroid functions Lab Results  Component Value Date   TSH 2.690 12/08/2022   T4TOTAL 5.3 12/08/2022   Last vitamin D Lab Results  Component Value Date   VD25OH 29 (L) 02/02/2022   Last vitamin B12 and Folate Lab Results  Component Value Date   VITAMINB12 446 05/08/2014        Assessment & Plan:    Routine Health Maintenance and Physical Exam  Immunization History  Administered Date(s) Administered   Tdap 10/22/2013    Health Maintenance  Topic Date Due   COVID-19 Vaccine (1) 02/16/2024 (Originally 11/02/1976)   INFLUENZA VACCINE  02/20/2024 (Originally 06/23/2023)   Zoster Vaccines- Shingrix (1 of 2) 05/02/2024 (Originally 11/02/1990)   Colonoscopy  08/01/2024 (Originally 11/02/2016)   DTaP/Tdap/Td (2 - Td or Tdap) 01/30/2025 (Originally 10/23/2023)   Hepatitis C Screening  01/30/2025 (Originally 11/02/1989)   HIV Screening  Completed   HPV VACCINES  Aged Out    Discussed health benefits of physical activity, and encouraged him to engage in regular exercise appropriate for his age and condition.  Problem List Items Addressed This Visit       Cardiovascular and Mediastinum   Primary hypertension   Relevant Medications   lisinopril (ZESTRIL) 40 MG tablet   hydrochlorothiazide (HYDRODIURIL) 25 MG tablet   Other Relevant Orders   Lipid panel   CBC with Differential/Platelet   CMP14+EGFR   Thyroid Panel With TSH     Other   Hyperlipidemia   Relevant Medications   lisinopril (ZESTRIL) 40 MG tablet   hydrochlorothiazide (HYDRODIURIL) 25 MG tablet   Other Relevant Orders   Lipid panel    Vitamin D deficiency   Relevant Orders   VITAMIN D 25 Hydroxy (Vit-D Deficiency, Fractures)   Other Visit Diagnoses       Annual physical exam    -  Primary   Relevant Orders  Lipid panel   CBC with Differential/Platelet   CMP14+EGFR   Thyroid Panel With TSH     Assessment and Plan    Cough Intermittent nocturnal and morning cough lasting about ten minutes, likely allergy-related as it does not persist throughout the day. He has not been taking any allergy medications. - Recommend taking Claritin or Zyrtec nightly for a few weeks to assess effectiveness.  Hypertension Chronic condition managed with lisinopril and hydrochlorothiazide. Blood pressure is currently well-controlled. Basic labs are necessary to monitor kidney and liver function due to medication use. - Continue lisinopril and hydrochlorothiazide. - Order basic labs to monitor kidney and liver function.  Erectile Dysfunction Managed with Cialis, obtained through a referral to [facility]. - Continue Cialis as prescribed.  General Health Maintenance Routine health maintenance discussed, including colorectal and prostate cancer screening. No family history of these cancers. He declined colorectal cancer screening at this time. - Order routine labs including liver, kidney, thyroid function, and complete blood count. - Discussed colorectal cancer screening starting at age 73; he declined screening at this time. - Discussed prostate cancer symptoms and advised to report any urinary changes or symptoms.  Follow-up Routine follow-up and medication management discussed. - Schedule next physical in one year. - Instruct him to contact for medication refills if needed before next physical.       Return in about 1 year (around 01/30/2025), or if symptoms worsen or fail to improve, for Annual Physical.     Kari Baars, FNP

## 2024-02-01 ENCOUNTER — Encounter: Payer: Self-pay | Admitting: Family Medicine

## 2024-02-01 LAB — LIPID PANEL
Cholesterol, Total: 192 mg/dL (ref 100–199)
HDL: 30 mg/dL — ABNORMAL LOW (ref >39)
LDL CALC COMMENT:: 6.4 ratio — ABNORMAL HIGH (ref 0.0–5.0)
LDL Chol Calc (NIH): 84 mg/dL (ref 0–99)
Triglycerides: 482 mg/dL — ABNORMAL HIGH (ref 0–149)
VLDL Cholesterol Cal: 78 mg/dL — ABNORMAL HIGH (ref 5–40)

## 2024-02-01 LAB — CMP14+EGFR
ALT: 27 IU/L (ref 0–44)
AST: 26 IU/L (ref 0–40)
Albumin: 4.5 g/dL (ref 3.8–4.9)
Alkaline Phosphatase: 74 IU/L (ref 44–121)
BUN/Creatinine Ratio: 11 (ref 9–20)
BUN: 12 mg/dL (ref 6–24)
Bilirubin Total: 0.9 mg/dL (ref 0.0–1.2)
CO2: 25 mmol/L (ref 20–29)
Calcium: 9.7 mg/dL (ref 8.7–10.2)
Chloride: 98 mmol/L (ref 96–106)
Creatinine, Ser: 1.06 mg/dL (ref 0.76–1.27)
Globulin, Total: 2.5 g/dL (ref 1.5–4.5)
Glucose: 113 mg/dL — ABNORMAL HIGH (ref 70–99)
Potassium: 4.4 mmol/L (ref 3.5–5.2)
Sodium: 137 mmol/L (ref 134–144)
Total Protein: 7 g/dL (ref 6.0–8.5)
eGFR: 84 mL/min/{1.73_m2} (ref >59)

## 2024-02-01 LAB — CBC WITH DIFFERENTIAL/PLATELET
Basophils Absolute: 0.1 10*3/uL (ref 0.0–0.2)
Basos: 2 %
EOS (ABSOLUTE): 0.2 10*3/uL (ref 0.0–0.4)
Eos: 3 %
Hematocrit: 49.3 % (ref 37.5–51.0)
Hemoglobin: 16.4 g/dL (ref 13.0–17.7)
Immature Grans (Abs): 0 10*3/uL (ref 0.0–0.1)
Immature Granulocytes: 0 %
Lymphocytes Absolute: 1.6 10*3/uL (ref 0.7–3.1)
Lymphs: 28 %
MCH: 31.8 pg (ref 26.6–33.0)
MCHC: 33.3 g/dL (ref 31.5–35.7)
MCV: 96 fL (ref 79–97)
Monocytes Absolute: 0.6 10*3/uL (ref 0.1–0.9)
Monocytes: 11 %
Neutrophils Absolute: 3.4 10*3/uL (ref 1.4–7.0)
Neutrophils: 56 %
Platelets: 262 10*3/uL (ref 150–450)
RBC: 5.15 x10E6/uL (ref 4.14–5.80)
RDW: 12.9 % (ref 11.6–15.4)
WBC: 5.9 10*3/uL (ref 3.4–10.8)

## 2024-02-01 LAB — THYROID PANEL WITH TSH
Free Thyroxine Index: 1.8 (ref 1.2–4.9)
T3 Uptake Ratio: 30 % (ref 24–39)
T4, Total: 5.9 ug/dL (ref 4.5–12.0)
TSH: 2.17 u[IU]/mL (ref 0.450–4.500)

## 2024-02-01 LAB — VITAMIN D 25 HYDROXY (VIT D DEFICIENCY, FRACTURES): Vit D, 25-Hydroxy: 19.8 ng/mL — ABNORMAL LOW (ref 30.0–100.0)

## 2024-06-17 ENCOUNTER — Other Ambulatory Visit: Payer: Self-pay | Admitting: Family Medicine

## 2024-06-17 DIAGNOSIS — I10 Essential (primary) hypertension: Secondary | ICD-10-CM

## 2024-06-18 NOTE — Telephone Encounter (Signed)
 Different pharmacy request, remaining refills sent

## 2024-07-05 ENCOUNTER — Encounter: Payer: Self-pay | Admitting: Family Medicine

## 2024-07-05 DIAGNOSIS — N521 Erectile dysfunction due to diseases classified elsewhere: Secondary | ICD-10-CM

## 2024-07-06 MED ORDER — TADALAFIL 10 MG PO TABS: 10.0000 mg | ORAL_TABLET | Freq: Every day | ORAL | 2 refills | Status: DC | PRN

## 2024-07-10 ENCOUNTER — Other Ambulatory Visit: Payer: Self-pay

## 2024-07-10 DIAGNOSIS — N521 Erectile dysfunction due to diseases classified elsewhere: Secondary | ICD-10-CM

## 2024-07-10 MED ORDER — TADALAFIL 10 MG PO TABS: 10.0000 mg | ORAL_TABLET | Freq: Every day | ORAL | 2 refills | Status: AC | PRN

## 2024-07-10 NOTE — Telephone Encounter (Signed)
 Please send refills to [facility]. It will be cheaper for pt to get the rx through this pharmacy. Spouse did not have a phone or fax number. Spouse says that Walter Wilkinson has sent the rx to the pharmacy before to this pharmacy.

## 2025-01-31 ENCOUNTER — Encounter: Payer: Self-pay | Admitting: Family Medicine
